# Patient Record
Sex: Female | Born: 1935 | Race: White | Hispanic: No | Smoking: Former smoker
Health system: Southern US, Community
[De-identification: ages and names within clinical notes are randomized; demographics above are authoritative.]

## PROBLEM LIST (undated history)

## (undated) DIAGNOSIS — M109 Gout, unspecified: Secondary | ICD-10-CM

## (undated) DIAGNOSIS — F0280 Dementia in other diseases classified elsewhere without behavioral disturbance: Secondary | ICD-10-CM

## (undated) DIAGNOSIS — N6019 Diffuse cystic mastopathy of unspecified breast: Secondary | ICD-10-CM

## (undated) DIAGNOSIS — I10 Essential (primary) hypertension: Secondary | ICD-10-CM

## (undated) DIAGNOSIS — N3090 Cystitis, unspecified without hematuria: Secondary | ICD-10-CM

## (undated) DIAGNOSIS — Q782 Osteopetrosis: Secondary | ICD-10-CM

## (undated) DIAGNOSIS — G309 Alzheimer's disease, unspecified: Secondary | ICD-10-CM

## (undated) HISTORY — DX: Cystitis, unspecified without hematuria: N30.90

## (undated) HISTORY — DX: Dementia in other diseases classified elsewhere, unspecified severity, without behavioral disturbance, psychotic disturbance, mood disturbance, and anxiety: F02.80

## (undated) HISTORY — DX: Osteopetrosis: Q78.2

## (undated) HISTORY — PX: BREAST EXCISIONAL BIOPSY: SUR124

## (undated) HISTORY — DX: Gout, unspecified: M10.9

## (undated) HISTORY — PX: TONSILLECTOMY: SUR1361

## (undated) HISTORY — PX: NASAL SINUS SURGERY: SHX719

## (undated) HISTORY — DX: Diffuse cystic mastopathy of unspecified breast: N60.19

## (undated) HISTORY — DX: Essential (primary) hypertension: I10

## (undated) HISTORY — DX: Dementia in other diseases classified elsewhere, unspecified severity, without behavioral disturbance, psychotic disturbance, mood disturbance, and anxiety: G30.9

## (undated) HISTORY — PX: OOPHORECTOMY: SHX86

---

## 1972-05-14 HISTORY — PX: BREAST LUMPECTOMY: SHX2

## 1978-05-14 HISTORY — PX: ABDOMINAL HYSTERECTOMY: SHX81

## 1978-05-14 HISTORY — PX: TOTAL ABDOMINAL HYSTERECTOMY W/ BILATERAL SALPINGOOPHORECTOMY: SHX83

## 1992-05-14 HISTORY — PX: COLONOSCOPY: SHX174

## 2004-03-21 ENCOUNTER — Ambulatory Visit: Payer: Self-pay

## 2005-04-19 ENCOUNTER — Ambulatory Visit: Payer: Self-pay

## 2005-05-08 ENCOUNTER — Ambulatory Visit: Payer: Self-pay

## 2005-09-26 ENCOUNTER — Emergency Department: Payer: Self-pay | Admitting: Emergency Medicine

## 2005-12-21 ENCOUNTER — Ambulatory Visit: Payer: Self-pay | Admitting: Family Medicine

## 2006-03-20 ENCOUNTER — Ambulatory Visit: Payer: Self-pay | Admitting: General Surgery

## 2006-05-14 HISTORY — PX: BLADDER STONE REMOVAL: SHX568

## 2006-09-16 ENCOUNTER — Ambulatory Visit: Payer: Self-pay | Admitting: General Surgery

## 2007-01-17 ENCOUNTER — Ambulatory Visit: Payer: Self-pay | Admitting: Urology

## 2007-01-30 ENCOUNTER — Other Ambulatory Visit: Payer: Self-pay

## 2007-01-30 ENCOUNTER — Ambulatory Visit: Payer: Self-pay

## 2007-10-01 ENCOUNTER — Ambulatory Visit: Payer: Self-pay | Admitting: General Surgery

## 2008-10-04 ENCOUNTER — Ambulatory Visit: Payer: Self-pay | Admitting: Family Medicine

## 2009-05-25 ENCOUNTER — Ambulatory Visit: Payer: Self-pay | Admitting: Internal Medicine

## 2009-08-11 ENCOUNTER — Ambulatory Visit: Payer: Self-pay | Admitting: Unknown Physician Specialty

## 2009-10-14 ENCOUNTER — Ambulatory Visit: Payer: Self-pay | Admitting: General Surgery

## 2010-10-16 ENCOUNTER — Ambulatory Visit: Payer: Self-pay | Admitting: General Surgery

## 2011-02-15 ENCOUNTER — Ambulatory Visit: Payer: Self-pay | Admitting: Family Medicine

## 2011-05-15 HISTORY — PX: LAPAROSCOPY: SHX197

## 2011-08-31 ENCOUNTER — Inpatient Hospital Stay: Payer: Self-pay | Admitting: Internal Medicine

## 2011-08-31 LAB — URINALYSIS, COMPLETE
Glucose,UR: NEGATIVE mg/dL (ref 0–75)
Nitrite: POSITIVE
Ph: 5 (ref 4.5–8.0)
Protein: 100
RBC,UR: 18 /HPF (ref 0–5)
Squamous Epithelial: 4

## 2011-08-31 LAB — COMPREHENSIVE METABOLIC PANEL
Alkaline Phosphatase: 175 U/L — ABNORMAL HIGH (ref 50–136)
BUN: 42 mg/dL — ABNORMAL HIGH (ref 7–18)
Bilirubin,Total: 1 mg/dL (ref 0.2–1.0)
Co2: 19 mmol/L — ABNORMAL LOW (ref 21–32)
Creatinine: 2.33 mg/dL — ABNORMAL HIGH (ref 0.60–1.30)
EGFR (African American): 23 — ABNORMAL LOW
EGFR (Non-African Amer.): 20 — ABNORMAL LOW
Glucose: 97 mg/dL (ref 65–99)
Osmolality: 286 (ref 275–301)
SGPT (ALT): 105 U/L — ABNORMAL HIGH
Sodium: 138 mmol/L (ref 136–145)
Total Protein: 6.8 g/dL (ref 6.4–8.2)

## 2011-08-31 LAB — DIFFERENTIAL
Basophil %: 0.1 %
Eosinophil %: 0.1 %
Lymphocyte #: 0.7 10*3/uL — ABNORMAL LOW (ref 1.0–3.6)
Lymphocyte %: 4.9 %
Neutrophil %: 86.6 %

## 2011-08-31 LAB — CBC
HCT: 38.6 % (ref 35.0–47.0)
MCH: 28 pg (ref 26.0–34.0)
MCV: 86 fL (ref 80–100)
RBC: 4.51 10*6/uL (ref 3.80–5.20)

## 2011-08-31 LAB — CK TOTAL AND CKMB (NOT AT ARMC)
CK, Total: 673 U/L — ABNORMAL HIGH (ref 21–215)
CK-MB: 2.6 ng/mL (ref 0.5–3.6)
CK-MB: 2.1 ng/mL (ref 0.5–3.6)

## 2011-08-31 LAB — RAPID INFLUENZA A&B ANTIGENS

## 2011-09-01 ENCOUNTER — Ambulatory Visit: Payer: Self-pay | Admitting: Urology

## 2011-09-01 LAB — BASIC METABOLIC PANEL
Anion Gap: 11 (ref 7–16)
Calcium, Total: 9 mg/dL (ref 8.5–10.1)
Co2: 20 mmol/L — ABNORMAL LOW (ref 21–32)
EGFR (Non-African Amer.): 20 — ABNORMAL LOW
Glucose: 145 mg/dL — ABNORMAL HIGH (ref 65–99)
Potassium: 3.5 mmol/L (ref 3.5–5.1)
Sodium: 140 mmol/L (ref 136–145)

## 2011-09-01 LAB — CBC WITH DIFFERENTIAL/PLATELET
Basophil #: 0.1 10*3/uL (ref 0.0–0.1)
Basophil #: 0 10*3/uL (ref 0.0–0.1)
Basophil %: 0.3 %
Eosinophil #: 0 10*3/uL (ref 0.0–0.7)
Eosinophil %: 0.1 %
Eosinophil %: 0.1 %
HCT: 33.4 % — ABNORMAL LOW (ref 35.0–47.0)
HGB: 10.8 g/dL — ABNORMAL LOW (ref 12.0–16.0)
Lymphocyte #: 0.4 10*3/uL — ABNORMAL LOW (ref 1.0–3.6)
MCH: 28.5 pg (ref 26.0–34.0)
MCH: 27.7 pg (ref 26.0–34.0)
MCV: 86 fL (ref 80–100)
Monocyte #: 1.4 x10 3/mm — ABNORMAL HIGH (ref 0.2–0.9)
Monocyte %: 6 %
Neutrophil %: 92 %
Platelet: 137 10*3/uL — ABNORMAL LOW (ref 150–440)
RBC: 4.13 10*6/uL (ref 3.80–5.20)
RDW: 15 % — ABNORMAL HIGH (ref 11.5–14.5)
RDW: 14.7 % — ABNORMAL HIGH (ref 11.5–14.5)
WBC: 16.5 10*3/uL — ABNORMAL HIGH (ref 3.6–11.0)
WBC: 19 10*3/uL — ABNORMAL HIGH (ref 3.6–11.0)

## 2011-09-01 LAB — TROPONIN I: Troponin-I: 0.13 ng/mL — ABNORMAL HIGH

## 2011-09-01 LAB — LIPID PANEL
Cholesterol: 94 mg/dL (ref 0–200)
HDL Cholesterol: 4 mg/dL — ABNORMAL LOW (ref 40–60)
Ldl Cholesterol, Calc: 57 mg/dL (ref 0–100)
Triglycerides: 163 mg/dL (ref 0–200)

## 2011-09-01 LAB — CK TOTAL AND CKMB (NOT AT ARMC)
CK, Total: 533 U/L — ABNORMAL HIGH (ref 21–215)
CK-MB: 2.9 ng/mL (ref 0.5–3.6)

## 2011-09-01 LAB — HEPATIC FUNCTION PANEL A (ARMC)
Albumin: 2 g/dL — ABNORMAL LOW (ref 3.4–5.0)
Bilirubin,Total: 1.1 mg/dL — ABNORMAL HIGH (ref 0.2–1.0)
SGOT(AST): 91 U/L — ABNORMAL HIGH (ref 15–37)
SGPT (ALT): 80 U/L — ABNORMAL HIGH
Total Protein: 5.4 g/dL — ABNORMAL LOW (ref 6.4–8.2)

## 2011-09-01 LAB — MAGNESIUM: Magnesium: 2 mg/dL

## 2011-09-01 LAB — PROTIME-INR: Prothrombin Time: 15.1 secs — ABNORMAL HIGH (ref 11.5–14.7)

## 2011-09-01 LAB — APTT: Activated PTT: 34 secs (ref 23.6–35.9)

## 2011-09-01 LAB — HEMOGLOBIN A1C: Hemoglobin A1C: 5.7 % (ref 4.2–6.3)

## 2011-09-02 LAB — COMPREHENSIVE METABOLIC PANEL
Albumin: 1.7 g/dL — ABNORMAL LOW (ref 3.4–5.0)
Alkaline Phosphatase: 154 U/L — ABNORMAL HIGH (ref 50–136)
Anion Gap: 9 (ref 7–16)
BUN: 36 mg/dL — ABNORMAL HIGH (ref 7–18)
Calcium, Total: 9.3 mg/dL (ref 8.5–10.1)
Co2: 21 mmol/L (ref 21–32)
Creatinine: 1.72 mg/dL — ABNORMAL HIGH (ref 0.60–1.30)
EGFR (Non-African Amer.): 29 — ABNORMAL LOW
Glucose: 185 mg/dL — ABNORMAL HIGH (ref 65–99)
Osmolality: 291 (ref 275–301)
SGOT(AST): 58 U/L — ABNORMAL HIGH (ref 15–37)
Sodium: 139 mmol/L (ref 136–145)
Total Protein: 5.1 g/dL — ABNORMAL LOW (ref 6.4–8.2)

## 2011-09-02 LAB — CBC WITH DIFFERENTIAL/PLATELET
Basophil %: 0.1 %
Eosinophil #: 0 10*3/uL (ref 0.0–0.7)
Eosinophil %: 0 %
HCT: 32.8 % — ABNORMAL LOW (ref 35.0–47.0)
MCH: 27.9 pg (ref 26.0–34.0)
MCHC: 32.3 g/dL (ref 32.0–36.0)
RBC: 3.79 10*6/uL — ABNORMAL LOW (ref 3.80–5.20)
RDW: 14.9 % — ABNORMAL HIGH (ref 11.5–14.5)

## 2011-09-02 LAB — PROTIME-INR
INR: 1.1
Prothrombin Time: 14.8 secs — ABNORMAL HIGH (ref 11.5–14.7)

## 2011-09-02 LAB — APTT: Activated PTT: 34.9 secs (ref 23.6–35.9)

## 2011-09-03 LAB — COMPREHENSIVE METABOLIC PANEL
Alkaline Phosphatase: 141 U/L — ABNORMAL HIGH (ref 50–136)
Anion Gap: 9 (ref 7–16)
BUN: 28 mg/dL — ABNORMAL HIGH (ref 7–18)
Bilirubin,Total: 1.2 mg/dL — ABNORMAL HIGH (ref 0.2–1.0)
Chloride: 112 mmol/L — ABNORMAL HIGH (ref 98–107)
Co2: 22 mmol/L (ref 21–32)
Creatinine: 1.3 mg/dL (ref 0.60–1.30)
EGFR (Non-African Amer.): 40 — ABNORMAL LOW
Osmolality: 291 (ref 275–301)
Sodium: 143 mmol/L (ref 136–145)
Total Protein: 4.9 g/dL — ABNORMAL LOW (ref 6.4–8.2)

## 2011-09-03 LAB — CBC WITH DIFFERENTIAL/PLATELET
Basophil %: 0.2 %
Lymphocyte #: 1.1 10*3/uL (ref 1.0–3.6)
MCHC: 33.2 g/dL (ref 32.0–36.0)
Monocyte #: 1.1 x10 3/mm — ABNORMAL HIGH (ref 0.2–0.9)
Monocyte %: 6.4 %
Neutrophil #: 14.7 10*3/uL — ABNORMAL HIGH (ref 1.4–6.5)
Platelet: 89 10*3/uL — ABNORMAL LOW (ref 150–440)

## 2011-09-03 LAB — MAGNESIUM: Magnesium: 1.4 mg/dL — ABNORMAL LOW

## 2011-09-04 LAB — CBC WITH DIFFERENTIAL/PLATELET
HGB: 10 g/dL — ABNORMAL LOW (ref 12.0–16.0)
Lymphocyte %: 7.8 %
Neutrophil #: 11.2 10*3/uL — ABNORMAL HIGH (ref 1.4–6.5)
Platelet: 94 10*3/uL — ABNORMAL LOW (ref 150–440)
RBC: 3.49 10*6/uL — ABNORMAL LOW (ref 3.80–5.20)
WBC: 13.5 10*3/uL — ABNORMAL HIGH (ref 3.6–11.0)

## 2011-09-04 LAB — COMPREHENSIVE METABOLIC PANEL
Albumin: 1.7 g/dL — ABNORMAL LOW (ref 3.4–5.0)
Alkaline Phosphatase: 120 U/L (ref 50–136)
Anion Gap: 9 (ref 7–16)
Bilirubin,Total: 1.1 mg/dL — ABNORMAL HIGH (ref 0.2–1.0)
Calcium, Total: 9 mg/dL (ref 8.5–10.1)
Chloride: 114 mmol/L — ABNORMAL HIGH (ref 98–107)
Co2: 22 mmol/L (ref 21–32)
Creatinine: 1.01 mg/dL (ref 0.60–1.30)
EGFR (Non-African Amer.): 54 — ABNORMAL LOW
Glucose: 94 mg/dL (ref 65–99)
Potassium: 3.9 mmol/L (ref 3.5–5.1)
SGOT(AST): 34 U/L (ref 15–37)
SGPT (ALT): 39 U/L

## 2011-09-05 LAB — BODY FLUID CULTURE

## 2011-09-06 LAB — CULTURE, BLOOD (SINGLE)

## 2011-09-17 ENCOUNTER — Ambulatory Visit: Payer: Self-pay | Admitting: Urology

## 2011-09-19 ENCOUNTER — Ambulatory Visit: Payer: Self-pay | Admitting: Urology

## 2011-10-01 ENCOUNTER — Ambulatory Visit: Payer: Self-pay | Admitting: Urology

## 2011-10-17 ENCOUNTER — Ambulatory Visit: Payer: Self-pay | Admitting: General Surgery

## 2012-09-18 ENCOUNTER — Ambulatory Visit: Payer: Self-pay | Admitting: Unknown Physician Specialty

## 2012-10-17 ENCOUNTER — Ambulatory Visit: Payer: Self-pay | Admitting: General Surgery

## 2012-10-21 ENCOUNTER — Encounter: Payer: Self-pay | Admitting: General Surgery

## 2012-12-18 ENCOUNTER — Encounter: Payer: Self-pay | Admitting: General Surgery

## 2012-12-18 ENCOUNTER — Ambulatory Visit (INDEPENDENT_AMBULATORY_CARE_PROVIDER_SITE_OTHER): Payer: Medicare Other | Admitting: General Surgery

## 2012-12-18 VITALS — BP 130/76 | HR 76 | Resp 12 | Ht 66.0 in | Wt 194.0 lb

## 2012-12-18 DIAGNOSIS — N6019 Diffuse cystic mastopathy of unspecified breast: Secondary | ICD-10-CM

## 2012-12-18 NOTE — Patient Instructions (Addendum)
Patient to return in one year. 

## 2012-12-18 NOTE — Progress Notes (Signed)
Patient ID: Unknown Kaitlin [name], female   DOB: 10/23/1934, 78 y.o.   MRN: [id number]  Chief Complaint  Patient presents with  . Other    mammogram    HPI Kaitlin [name] is a 78 y.o. female. who presents for a breast evaluation. The most recent mammogram was done on 10/17/12 cat 2. Patient does perform regular self breast checks and gets regular mammograms done.    HPI  Past Medical History  Diagnosis Date  . Hypertension   . Diffuse cystic mastopathy   . Cystitis   . Gout     Past Surgical History  Procedure Laterality Date  . Nasal sinus surgery    . Colonoscopy  1994  . Abdominal hysterectomy  1980  . Breast lumpectomy  1974  . Bladder stone removal  2008  . Oophorectomy Bilateral   . Laparoscopy  2013    gallstone removal    No family history on file.  Social History History  Substance Use Topics  . Smoking status: Former Smoker -- 0.50 packs/day    Types: Cigarettes  . Smokeless tobacco: Never Used  . Alcohol Use: No    Allergies  Allergen Reactions  . Augmentin (Amoxicillin-Pot Clavulanate) Nausea And Vomiting and Rash  . Tetracyclines & Related Rash    Current Outpatient Prescriptions  Medication Sig Dispense Refill  . alendronate (FOSAMAX) 40 MG tablet Take 40 mg by mouth every 7 (seven) days. Take with a full glass of water on an empty stomach.      Marland Kitchen aspirin 81 MG tablet Take 81 mg by mouth daily.      . cholecalciferol (D-VI-SOL) 400 UNIT/ML LIQD Take 400 Units by mouth daily.      Marland Kitchen conjugated estrogens (PREMARIN) vaginal cream Place 0.5 g vaginally daily.      Marland Kitchen donepezil (ARICEPT) 10 MG tablet Take 10 mg by mouth at bedtime as needed.      . fluticasone (FLOVENT DISKUS) 50 MCG/BLIST diskus inhaler Inhale 1 puff into the lungs 2 (two) times daily.      Marland Kitchen loratadine (CLARITIN) 10 MG tablet Take 10 mg by mouth daily.      . potassium chloride (K-DUR) 10 MEQ tablet Take 10 mEq by mouth 2 (two) times daily.      . vitamin E (VITAMIN E) 400 UNIT capsule Take  400 Units by mouth daily.       No current facility-administered medications for this visit.    Review of Systems Review of Systems  Constitutional: Negative.   Respiratory: Negative.   Cardiovascular: Negative.     Blood pressure 130/76, pulse 76, resp. rate 12, height 5\' 6"  (1.676 m), weight 194 lb (87.998 kg).  Physical Exam Physical Exam  Constitutional: She is oriented to person, place, and time. She appears well-developed and well-nourished.  Eyes: Conjunctivae are normal. No scleral icterus.  Neck: Neck supple. No mass and no thyromegaly present.  Cardiovascular: Normal rate, regular rhythm and normal heart sounds.   Pulmonary/Chest: Breath sounds normal. Right breast exhibits no inverted nipple, no mass, no nipple discharge, no skin change and no tenderness. Left breast exhibits no inverted nipple, no mass, no nipple discharge, no skin change and no tenderness.  Abdominal: Soft. Bowel sounds are normal. There is no hepatomegaly. There is no tenderness. No hernia.  Lymphadenopathy:    She has no cervical adenopathy.    She has no axillary adenopathy.  Neurological: She is alert and oriented to person, place, and time.  Skin: Skin is warm  and dry.    Data Reviewed mammogram reviewed  Assessment    Stable exam     Plan   Patient to return in one year mammogram.       Ples Specter 12/18/2012, 9:51 AM

## 2013-05-24 IMAGING — US ABDOMEN ULTRASOUND
1 series · 17 of 25 positions shown · non-contrast
Comparison: none

REASON FOR EXAM: elevated lfts, renal failre. eval for biliary dz or
hydronehrosis
COMMENTS:

PROCEDURE:     US  - US ABDOMEN GENERAL SURVEY  - August 31, 2011  [DATE]
RESULT:      The liver demonstrates a homogeneous echotexture. Hepatopetal
flow is identified within the portal vein. There is no evidence of ascites.
The aorta and IVC are unremarkable. The pancreas is unremarkable.

[Series 1: abdomen ultrasound · 17 of 97 slices shown]
[im 1/97]
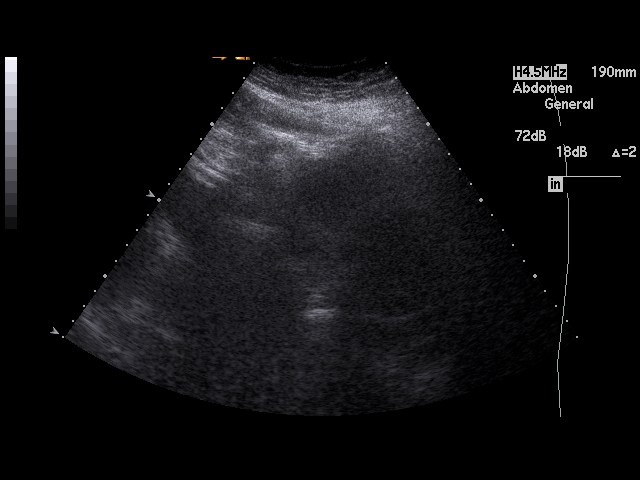
[im 9/97]
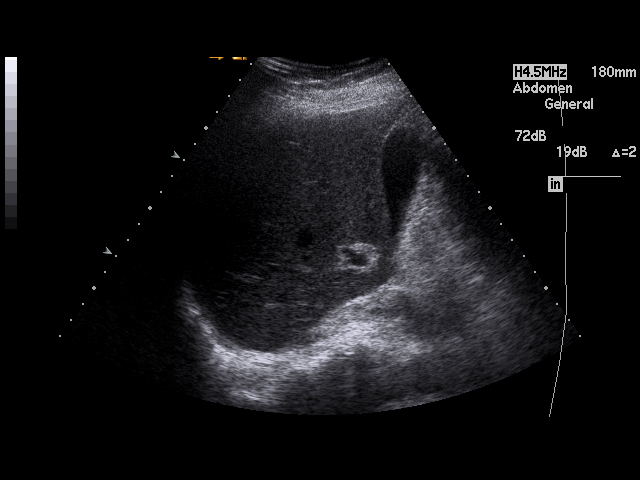
[im 13/97]
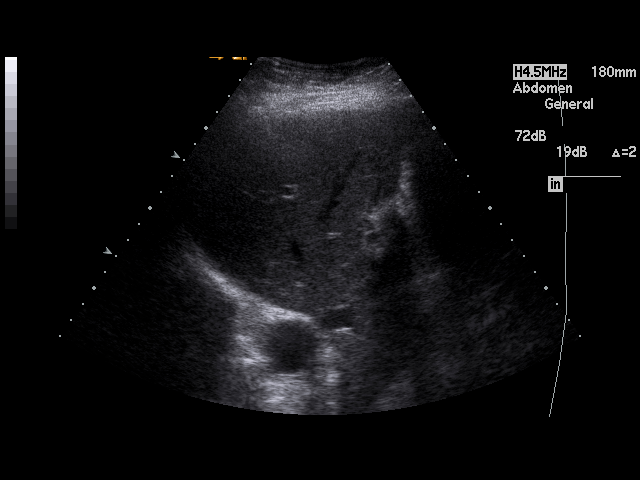
[im 21/97]
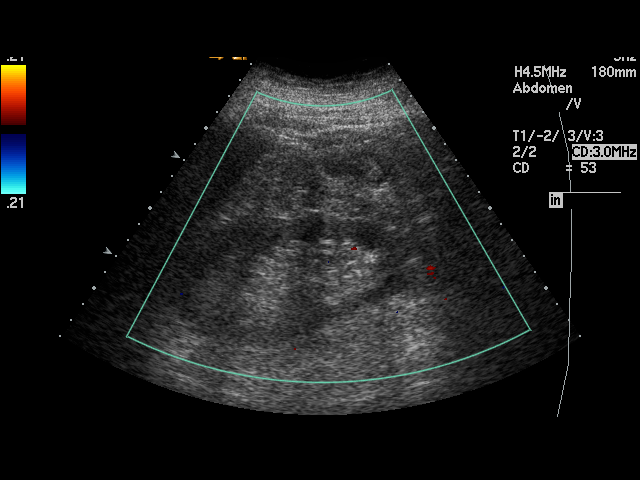
[im 25/97]
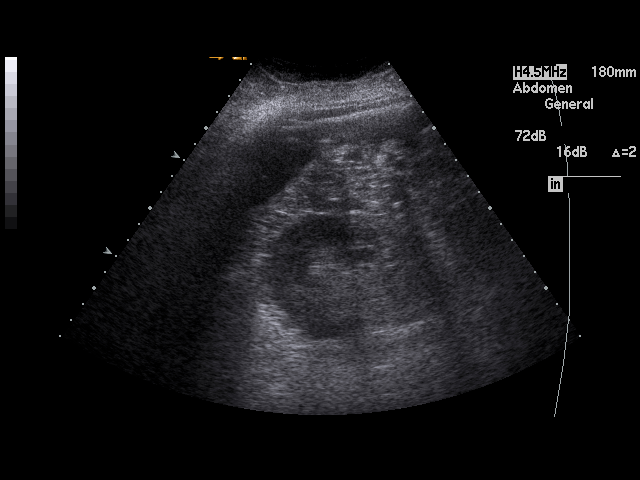
[im 33/97]
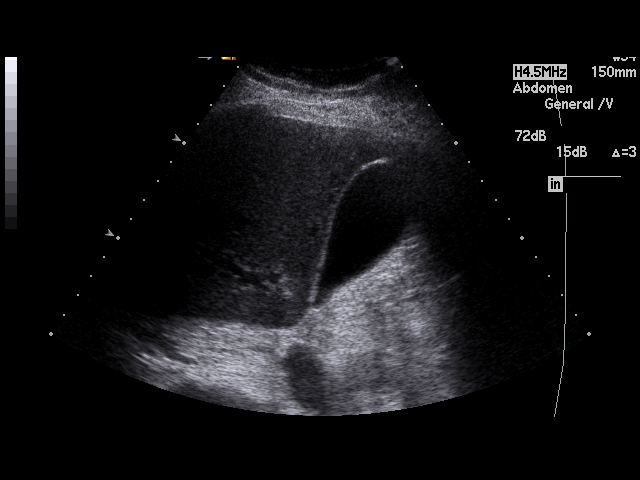
[im 37/97]
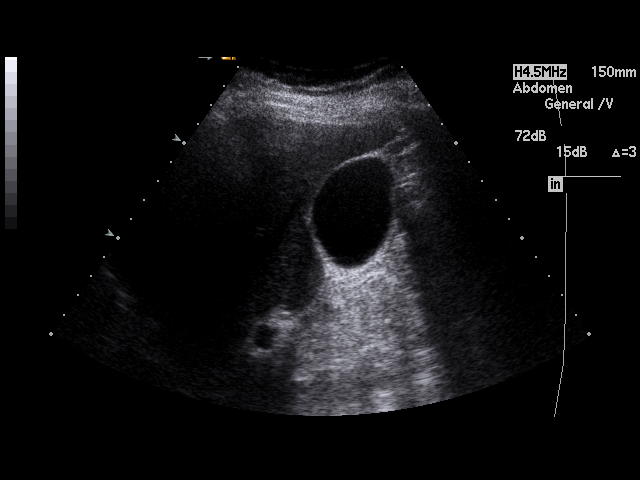
[im 45/97]
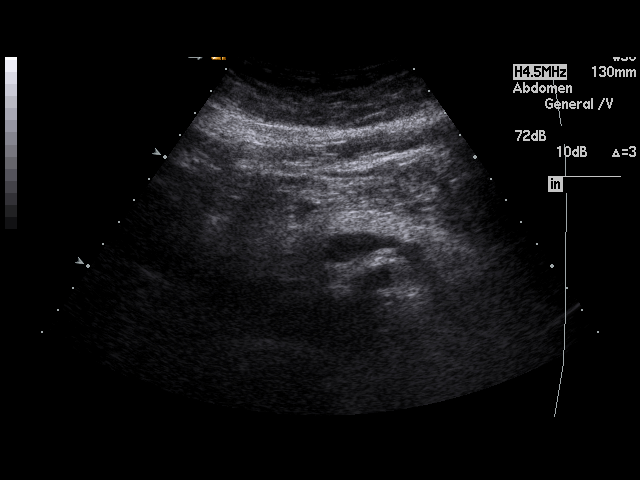
[im 49/97]
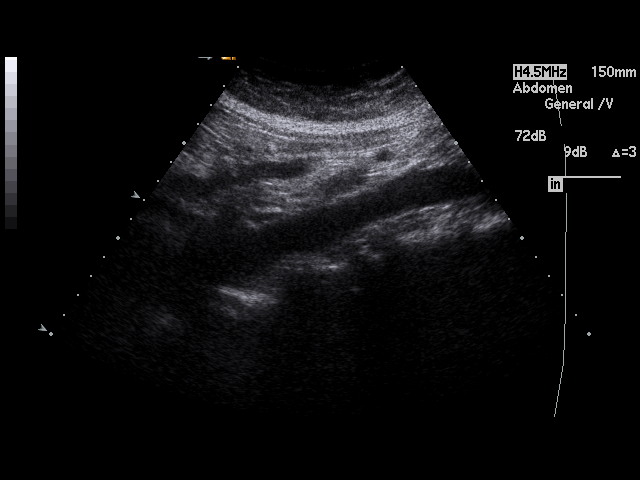
[im 53/97]
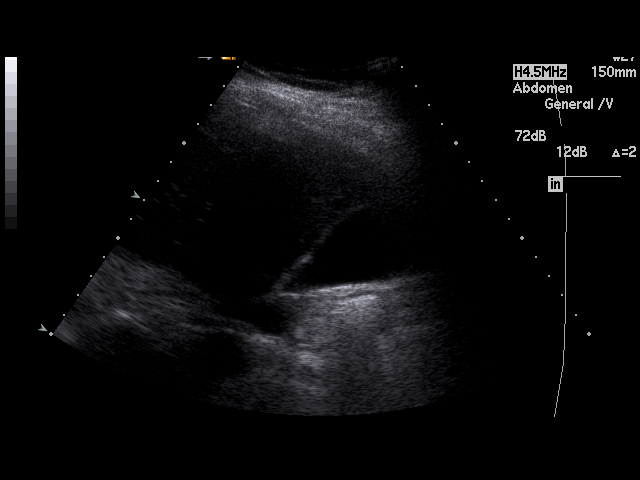
[im 61/97]
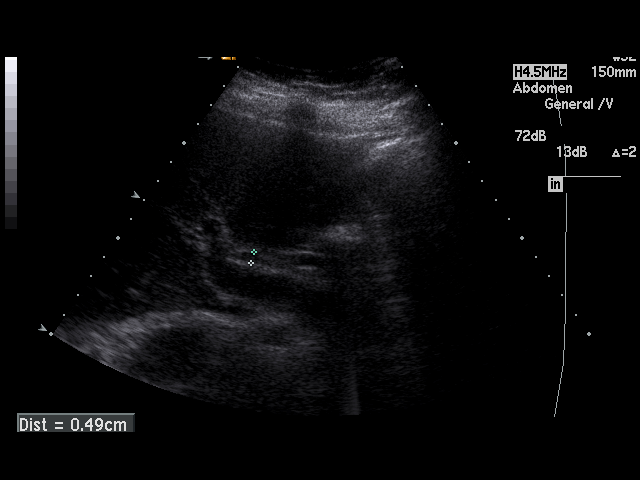
[im 65/97]
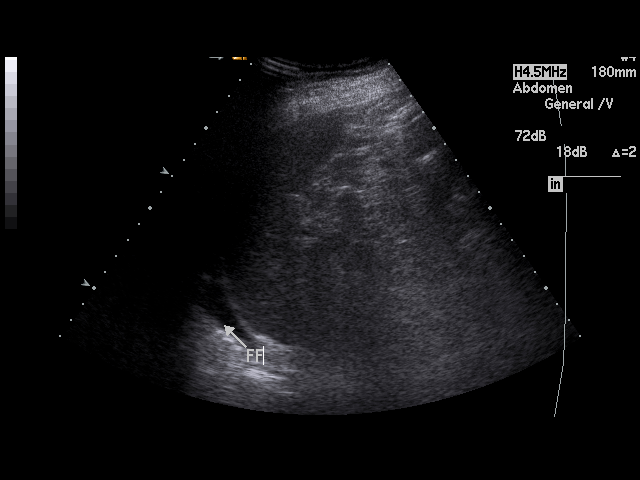
[im 73/97]
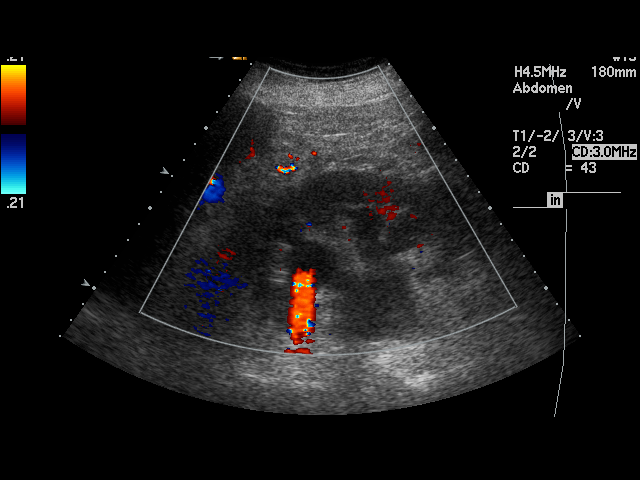
[im 77/97]
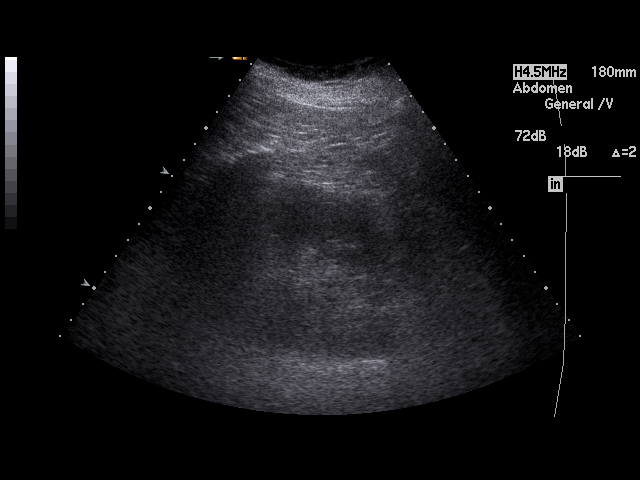
[im 85/97]
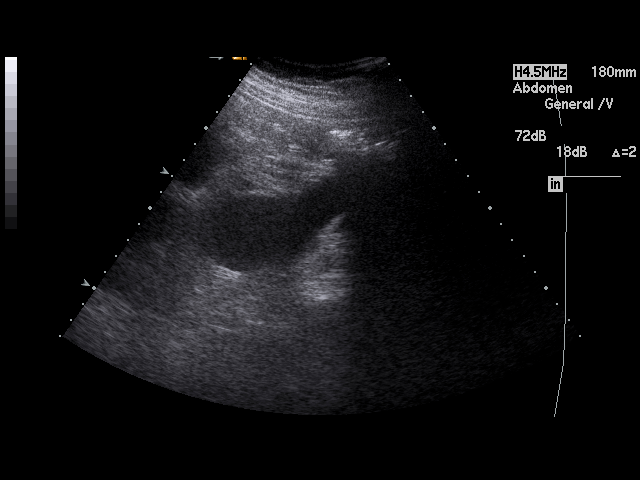
[im 89/97]
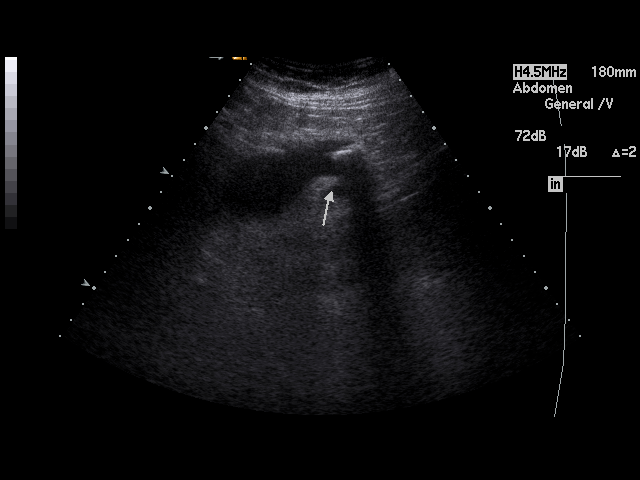
[im 97/97]
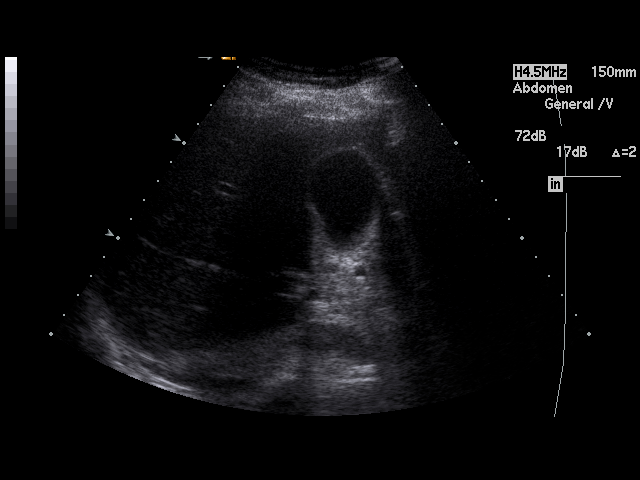

[17 of 25 positions shown; findings below may reference images not displayed]

IMPRESSION: 1. Moderate hydronephrosis as well as moderate hydroureter. The ureter
measures 2.07 cm in diameter. Within the proximal left ureter, a 1.5 mm
calculus is identified with acoustic shadowing.
2. Not mentioned above, there are findings which may represent a left
pleural effusion.
3. No sonographic evidence of cholecystitis.

## 2013-05-25 IMAGING — CT CT ABD-PELV W/O CM
1 of 4 series · 13 of 32 positions shown, 19 images · non-contrast
Comparison: none

REASON FOR EXAM: (1) hydronephrosis; (2) hydronephrosis-nephrostomy tube
placement
COMMENTS:

PROCEDURE:     CT  - CT ABDOMEN AND PELVIS W[DATE]  [DATE]
RESULT:
TECHNIQUE: Helical noncontrasted prone 3 mm sections were obtained from the
lung bases through the pubic symphysis.

[Series 2: soft tissue · axial · 0.87mm/px · z∈[-1241,-800]mm · 13 of 173 slices shown, 19 images]
[im 13/173  soft-tissue]
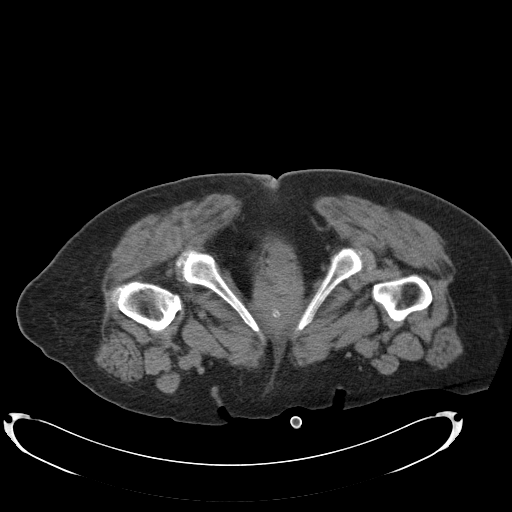
[im 13/173  bone]
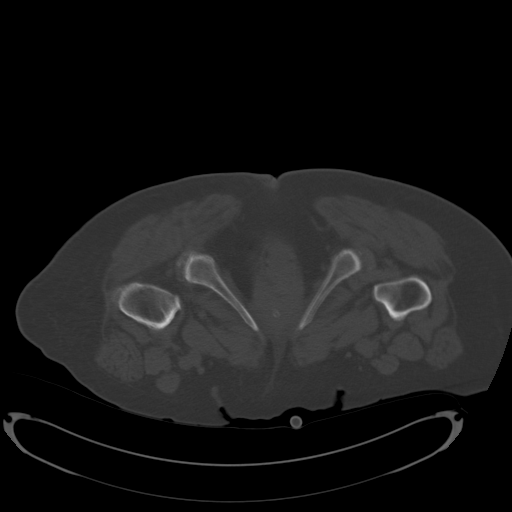
[im 25/173  soft-tissue]
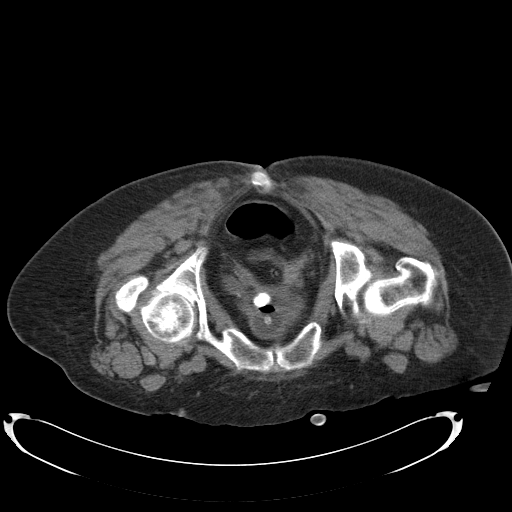
[im 37/173  soft-tissue]
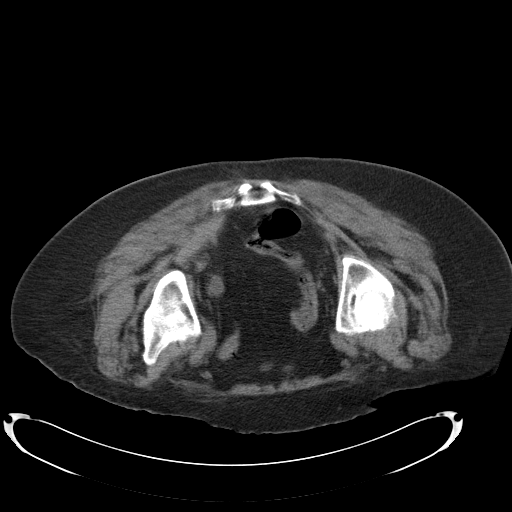
[im 50/173  soft-tissue]
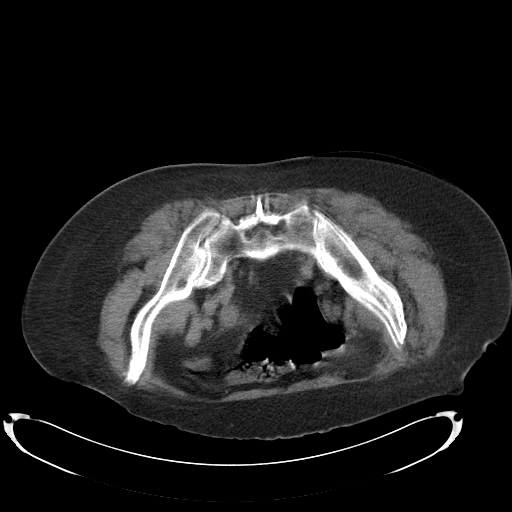
[im 62/173  soft-tissue]
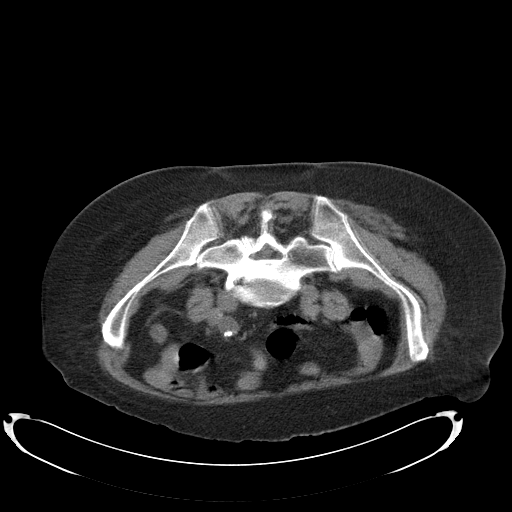
[im 74/173  soft-tissue]
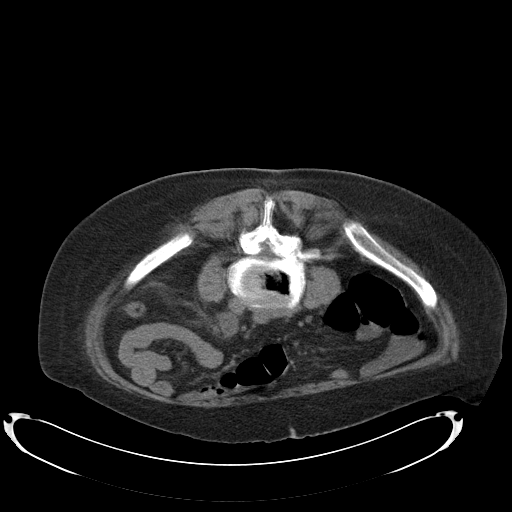
[im 87/173  soft-tissue]
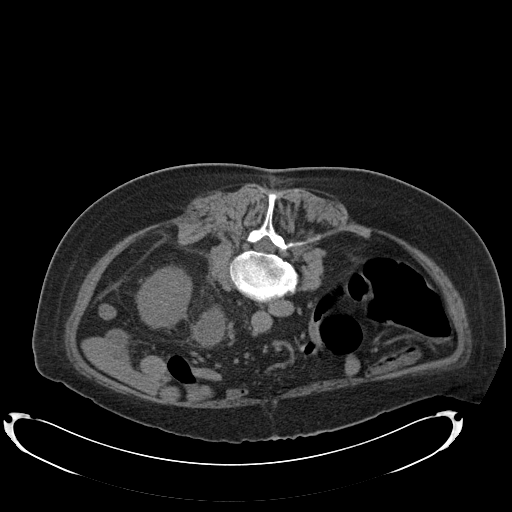
[im 99/173  soft-tissue]
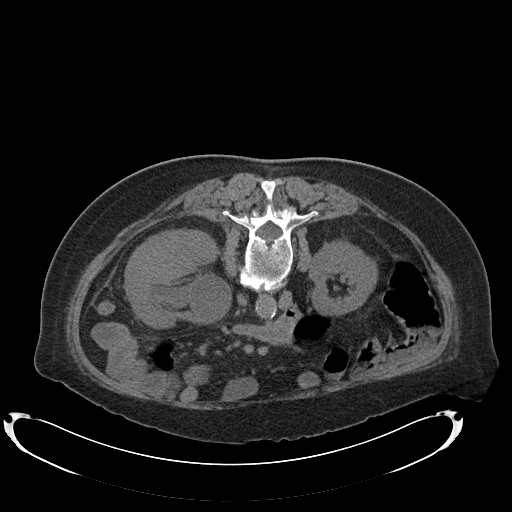
[im 111/173  soft-tissue]
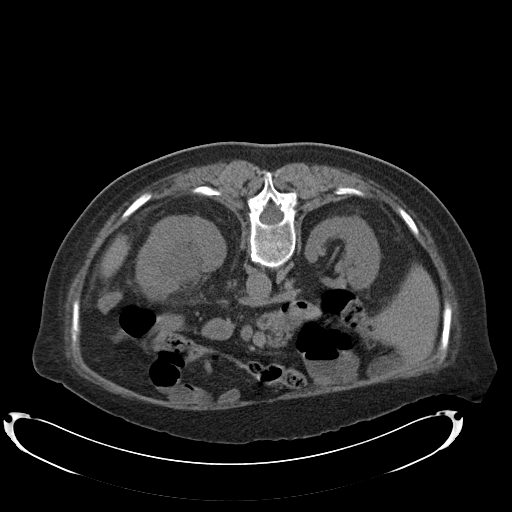
[im 111/173  bone]
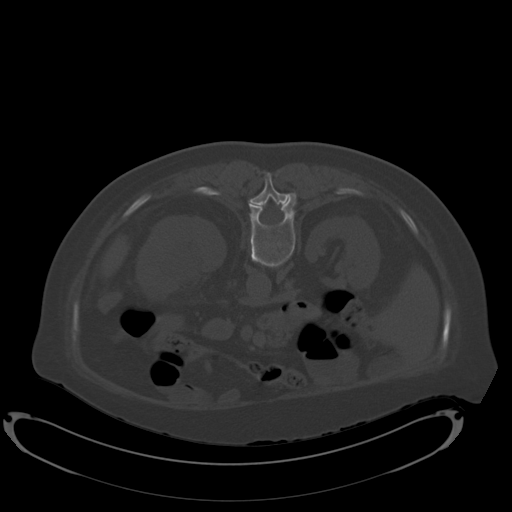
[im 123/173  soft-tissue]
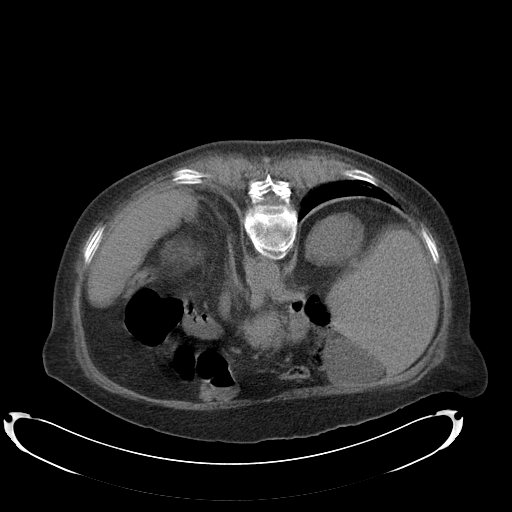
[im 123/173  lung]
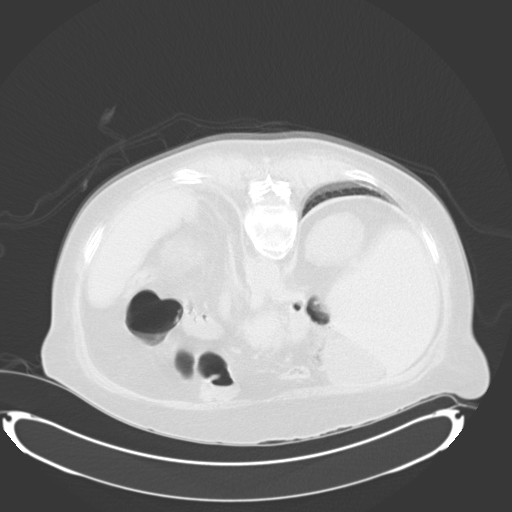
[im 136/173  soft-tissue]
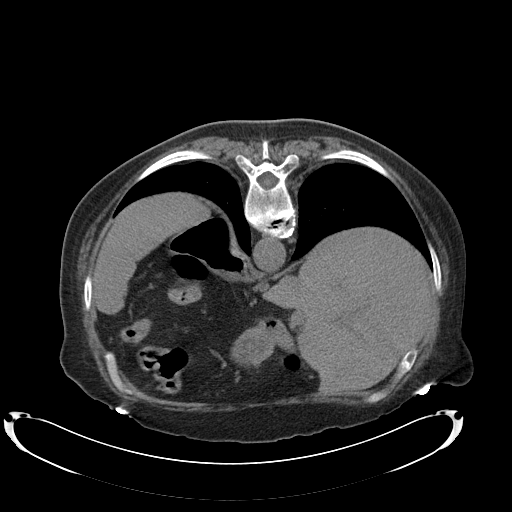
[im 136/173  lung]
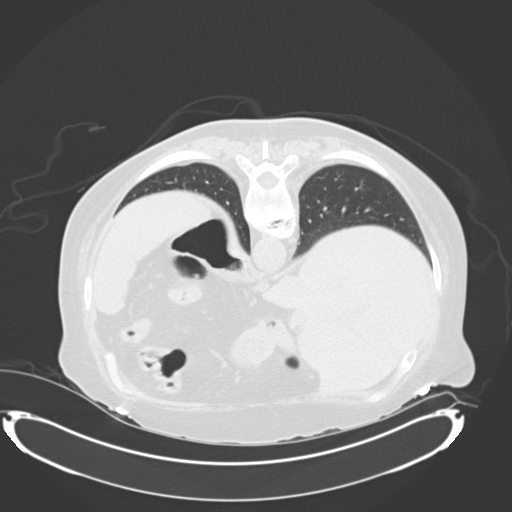
[im 148/173  soft-tissue]
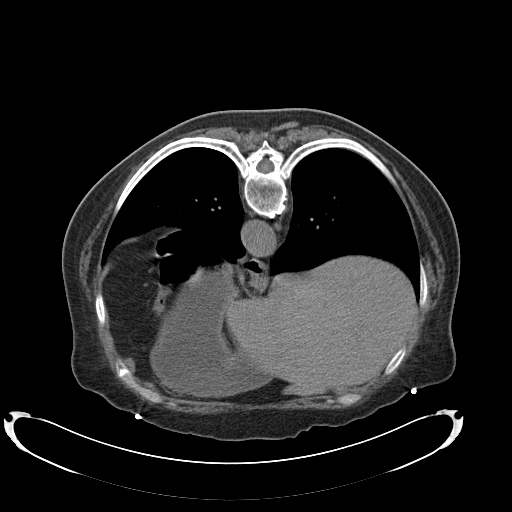
[im 148/173  lung]
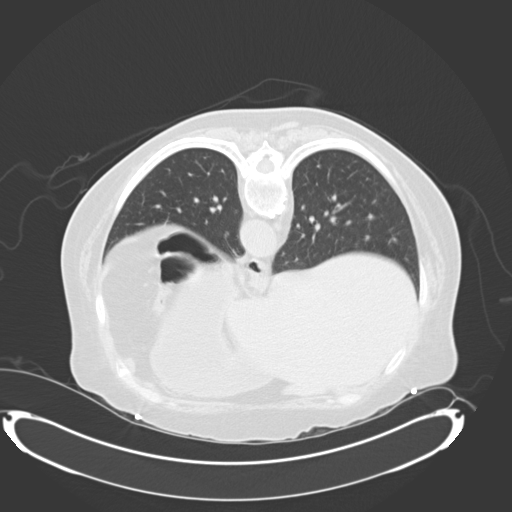
[im 160/173  soft-tissue]
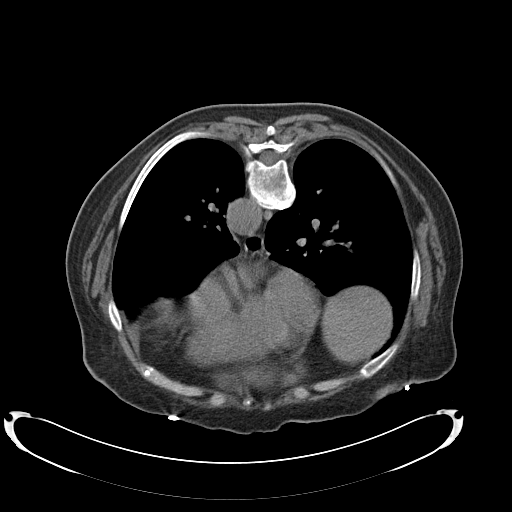
[im 160/173  lung]
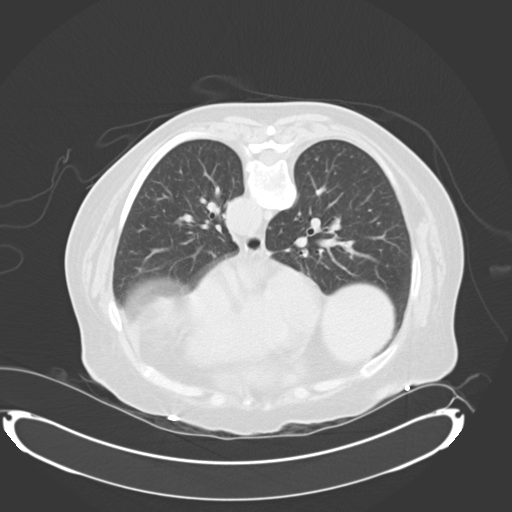

[13 of 32 positions shown; findings below may reference images not displayed]

FINDINGS: Severe hydronephrosis is once again identified within the left
kidney. There is no evidence of retroperitoneal structures in the region of
the left kidney which would preempt nephrostomy tube placement. The study is
otherwise unchanged from prior CT.
IMPRESSION: Pre nephrostomy tube evaluation demonstrating no anatomic variability which
would preempt nephrostomy tube placement.

## 2013-05-25 IMAGING — CT CT ABD-PELV W/O CM
1 of 2 series · 16 of 32 positions shown, 20 images · non-contrast
Comparison: none

REASON FOR EXAM: (1) tube placement; (2) tube placement
COMMENTS:

[Series 2: soft tissue · axial · 0.88mm/px · z∈[-534,-72]mm · 16 of 168 slices shown, 20 images]
[im 7/168  soft-tissue]
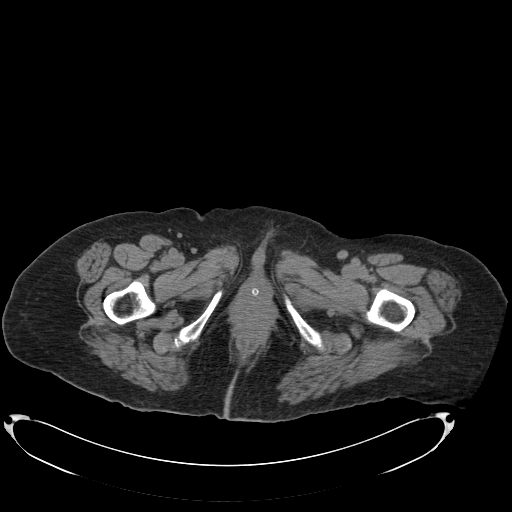
[im 7/168  bone]
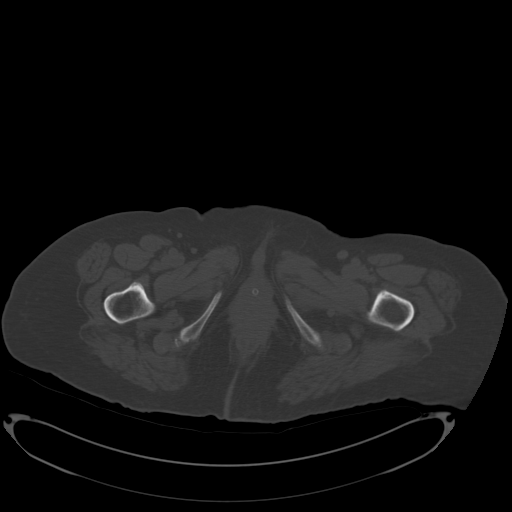
[im 21/168  soft-tissue]
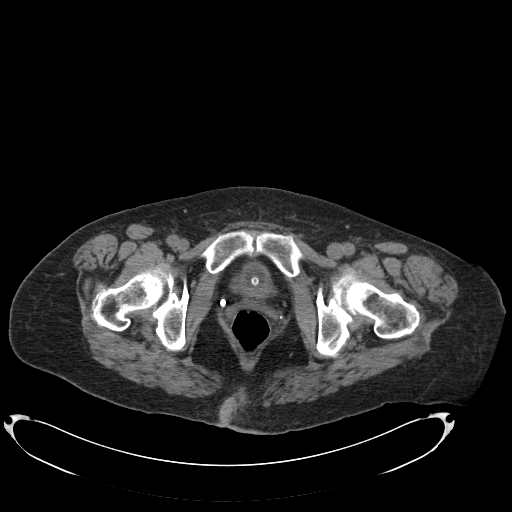
[im 35/168  soft-tissue]
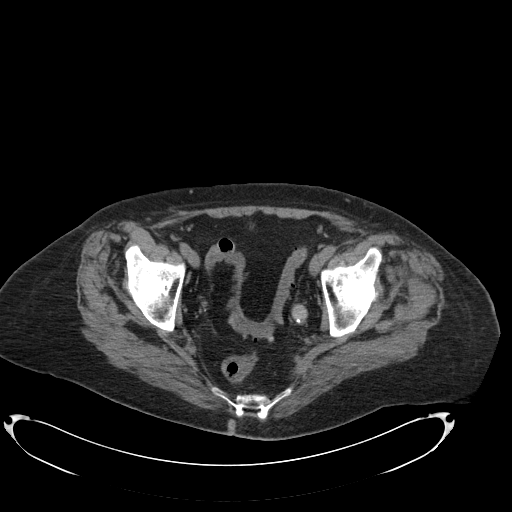
[im 42/168  soft-tissue]
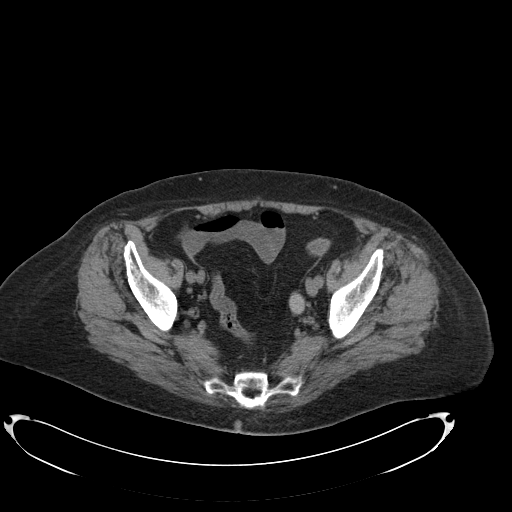
[im 56/168  soft-tissue]
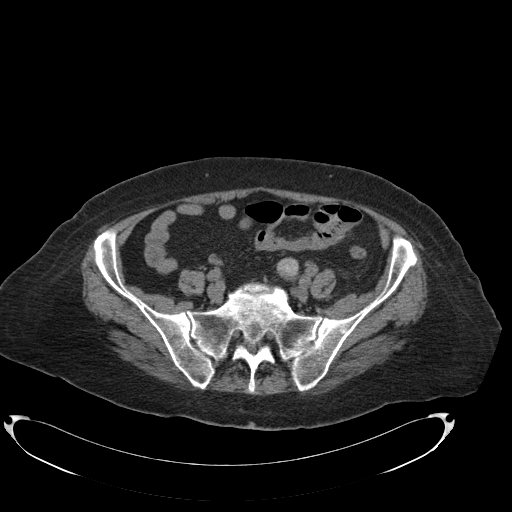
[im 70/168  soft-tissue]
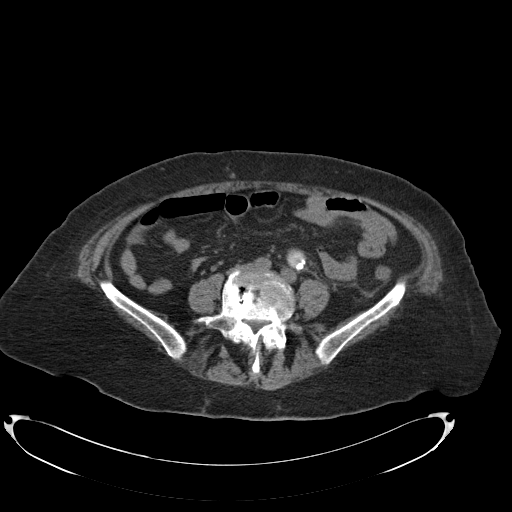
[im 77/168  soft-tissue]
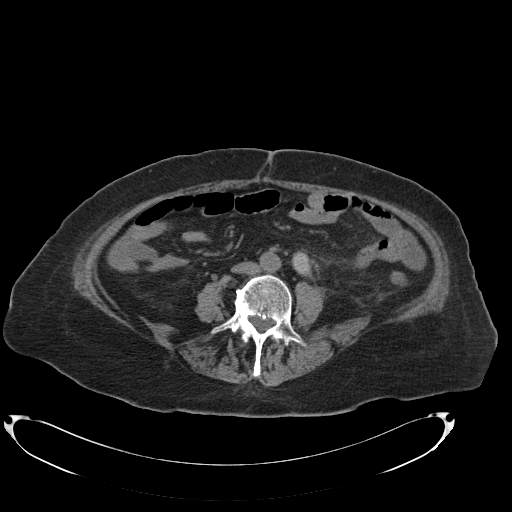
[im 91/168  soft-tissue]
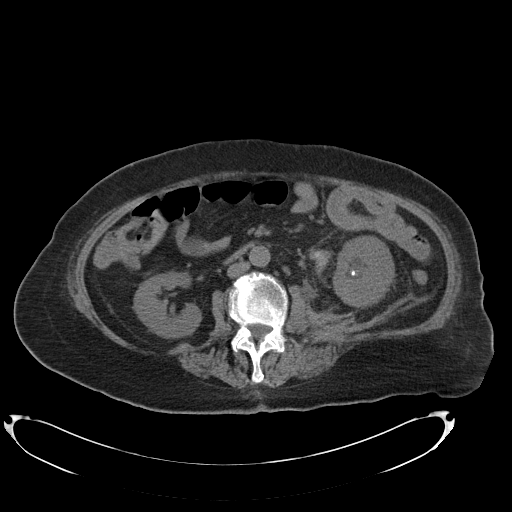
[im 98/168  soft-tissue]
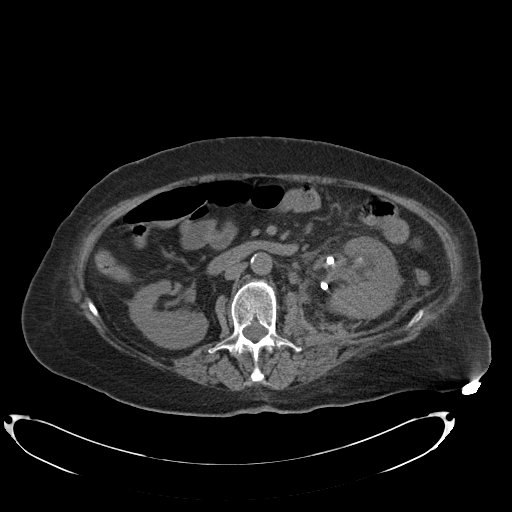
[im 98/168  bone]
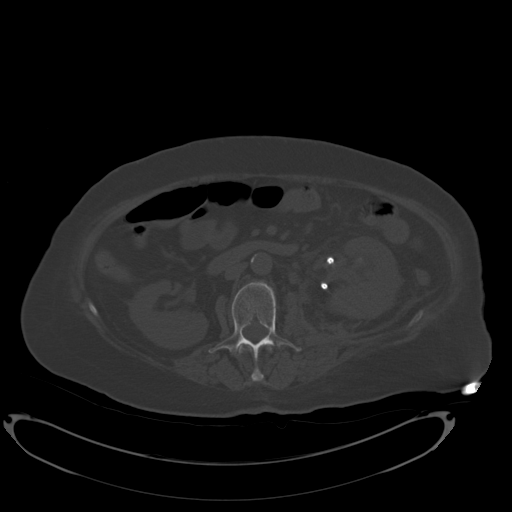
[im 112/168  soft-tissue]
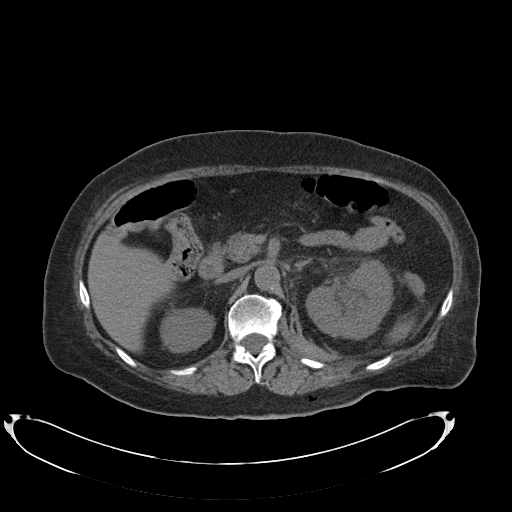
[im 126/168  soft-tissue]
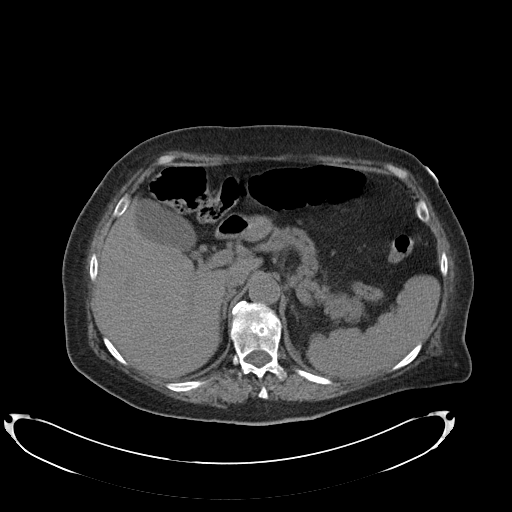
[im 133/168  soft-tissue]
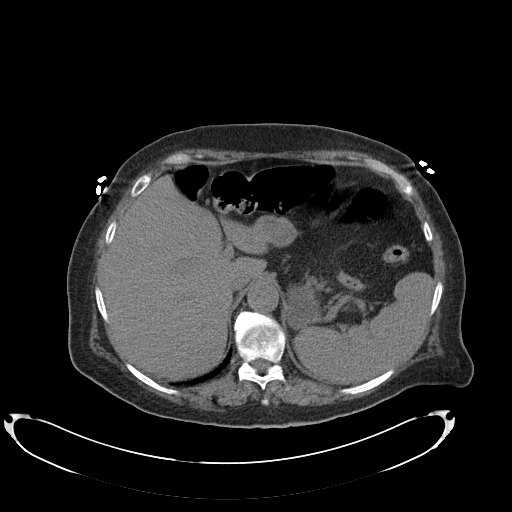
[im 140/168  lung]
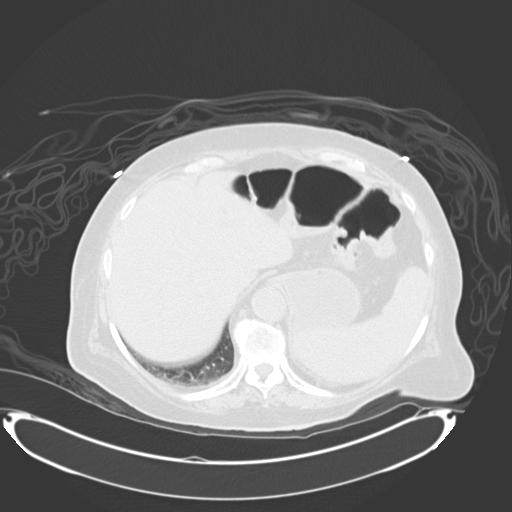
[im 147/168  soft-tissue]
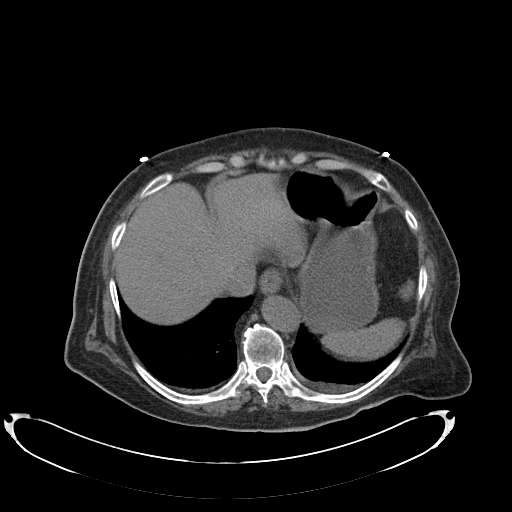
[im 147/168  lung]
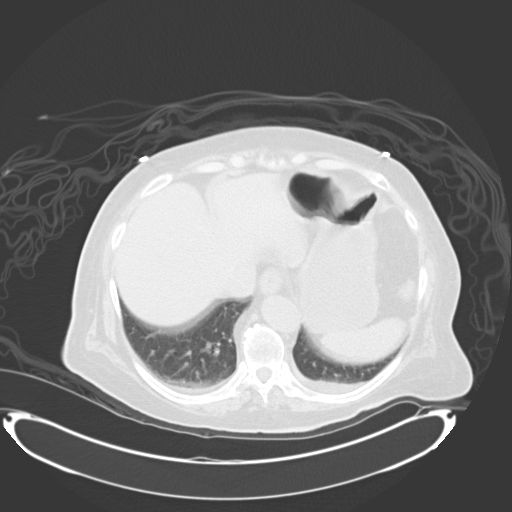
[im 154/168  lung]
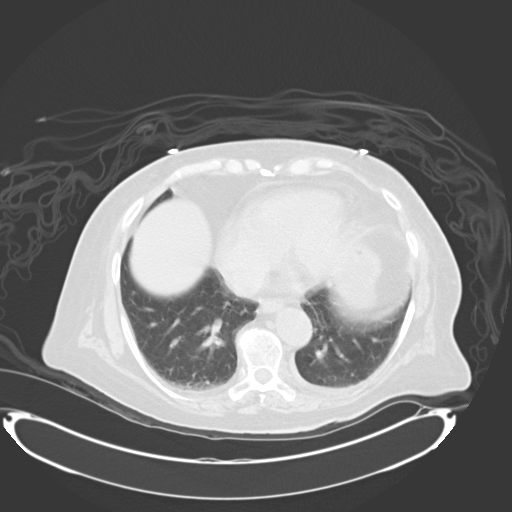
[im 161/168  soft-tissue]
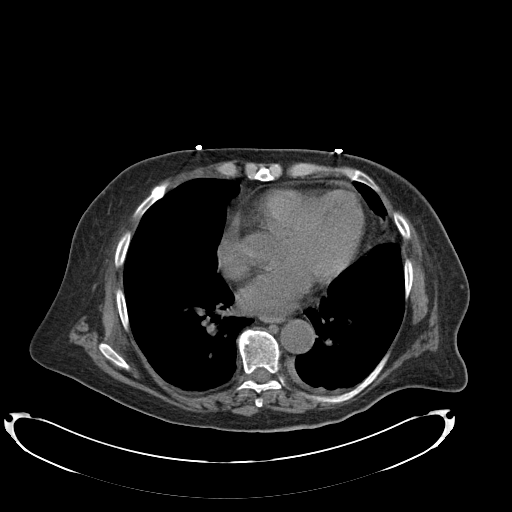
[im 161/168  lung]
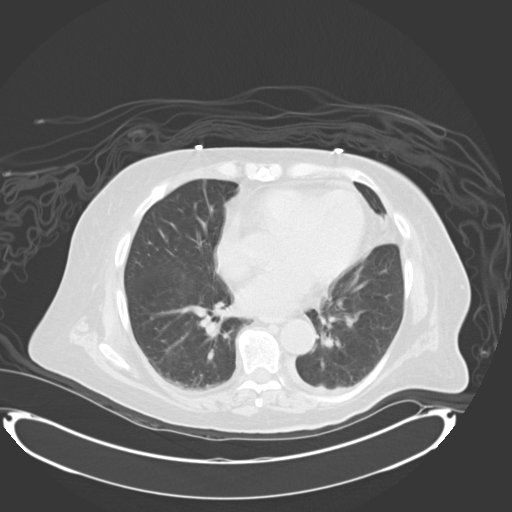

[16 of 32 positions shown; findings below may reference images not displayed]

PROCEDURE:     CT  - CT ABDOMEN AND PELVIS W[DATE]  [DATE]

RESULT:     Standard nonenhanced CT obtained. Liver normal. Spleen normal.
Pancreas normal. Nephrostomy tube noted coursing through the posterior
medial aspect of the kidney into the renal pelvis; the renal pelvis is
decompressed. Present identified hydronephrosis is decompressed. Ureteral
stone disease is noted. The ureteral stone disease is extensive. Large
stones in the distal ureter and upper ureter. Bladder is nondistended. Foley
noted. Small amount of air noted in the bladder. Right nephrolithiasis
noted. No bowel distention. Atelectasis.
IMPRESSION: Left Nephrostomy tube as described above. Multiple left
ureteral stones.

## 2013-06-18 ENCOUNTER — Ambulatory Visit: Payer: Self-pay

## 2013-07-23 DIAGNOSIS — R32 Unspecified urinary incontinence: Secondary | ICD-10-CM | POA: Insufficient documentation

## 2013-07-23 DIAGNOSIS — N200 Calculus of kidney: Secondary | ICD-10-CM | POA: Insufficient documentation

## 2013-07-23 DIAGNOSIS — M109 Gout, unspecified: Secondary | ICD-10-CM | POA: Insufficient documentation

## 2013-07-23 DIAGNOSIS — N952 Postmenopausal atrophic vaginitis: Secondary | ICD-10-CM | POA: Insufficient documentation

## 2013-07-23 DIAGNOSIS — I10 Essential (primary) hypertension: Secondary | ICD-10-CM | POA: Insufficient documentation

## 2013-07-23 DIAGNOSIS — D126 Benign neoplasm of colon, unspecified: Secondary | ICD-10-CM | POA: Insufficient documentation

## 2013-09-21 DIAGNOSIS — F0280 Dementia in other diseases classified elsewhere without behavioral disturbance: Secondary | ICD-10-CM | POA: Insufficient documentation

## 2013-09-21 DIAGNOSIS — G309 Alzheimer's disease, unspecified: Secondary | ICD-10-CM

## 2013-11-10 ENCOUNTER — Ambulatory Visit: Payer: Self-pay | Admitting: Internal Medicine

## 2013-12-15 ENCOUNTER — Ambulatory Visit: Payer: Self-pay | Admitting: Unknown Physician Specialty

## 2013-12-24 ENCOUNTER — Ambulatory Visit: Payer: Medicare Other | Admitting: General Surgery

## 2014-01-14 DIAGNOSIS — M2392 Unspecified internal derangement of left knee: Secondary | ICD-10-CM | POA: Insufficient documentation

## 2014-03-15 ENCOUNTER — Encounter: Payer: Self-pay | Admitting: General Surgery

## 2014-03-23 DIAGNOSIS — M1712 Unilateral primary osteoarthritis, left knee: Secondary | ICD-10-CM | POA: Insufficient documentation

## 2014-05-12 DIAGNOSIS — E042 Nontoxic multinodular goiter: Secondary | ICD-10-CM | POA: Insufficient documentation

## 2014-05-12 DIAGNOSIS — M810 Age-related osteoporosis without current pathological fracture: Secondary | ICD-10-CM | POA: Insufficient documentation

## 2014-05-12 DIAGNOSIS — E210 Primary hyperparathyroidism: Secondary | ICD-10-CM | POA: Insufficient documentation

## 2014-05-15 ENCOUNTER — Observation Stay: Payer: Self-pay | Admitting: Internal Medicine

## 2014-05-15 LAB — URINALYSIS, COMPLETE
Bilirubin,UR: NEGATIVE
Glucose,UR: NEGATIVE mg/dL
Nitrite: NEGATIVE
Ph: 6
Protein: 30
RBC,UR: 96 /HPF
Specific Gravity: 1.017
Squamous Epithelial: NONE SEEN
WBC UR: 189 /HPF

## 2014-05-15 LAB — COMPREHENSIVE METABOLIC PANEL
ALK PHOS: 99 U/L
Albumin: 2.7 g/dL — ABNORMAL LOW (ref 3.4–5.0)
Anion Gap: 8 (ref 7–16)
BUN: 24 mg/dL — AB (ref 7–18)
Bilirubin,Total: 1.2 mg/dL — ABNORMAL HIGH (ref 0.2–1.0)
CREATININE: 0.75 mg/dL (ref 0.60–1.30)
Calcium, Total: 9.8 mg/dL (ref 8.5–10.1)
Chloride: 104 mmol/L (ref 98–107)
Co2: 28 mmol/L (ref 21–32)
EGFR (African American): >60
EGFR (Non-African Amer.): >60
Glucose: 102 mg/dL — ABNORMAL HIGH (ref 65–99)
Osmolality: 284 (ref 275–301)
Potassium: 3.8 mmol/L (ref 3.5–5.1)
SGOT(AST): 68 U/L — ABNORMAL HIGH (ref 15–37)
SGPT (ALT): 54 U/L
Sodium: 140 mmol/L (ref 136–145)
Total Protein: 6.6 g/dL (ref 6.4–8.2)

## 2014-05-15 LAB — CBC
HCT: 40.9 %
HGB: 13.5 g/dL
MCH: 28.5 pg
MCHC: 33 g/dL
MCV: 86 fL
Platelet: 269 10*3/uL
RBC: 4.73 X10 6/mm 3
RDW: 14.5 %
WBC: 14.2 10*3/uL — ABNORMAL HIGH

## 2014-05-15 LAB — CK TOTAL AND CKMB (NOT AT ARMC)
CK, Total: 893 U/L — ABNORMAL HIGH (ref 26–192)
CK-MB: 13.4 ng/mL — ABNORMAL HIGH (ref 0.5–3.6)

## 2014-05-15 LAB — TROPONIN I: Troponin-I: <0.02 ng/mL

## 2014-05-16 ENCOUNTER — Ambulatory Visit: Payer: Self-pay | Admitting: Orthopedic Surgery

## 2014-05-16 LAB — TSH: THYROID STIMULATING HORM: 0.325 u[IU]/mL — AB

## 2014-05-16 LAB — CBC WITH DIFFERENTIAL/PLATELET
BASOS PCT: 0.1 %
Basophil #: 0 10*3/uL (ref 0.0–0.1)
Eosinophil #: 0 10*3/uL (ref 0.0–0.7)
Eosinophil %: 0.1 %
HCT: 40.7 % (ref 35.0–47.0)
HGB: 12.9 g/dL (ref 12.0–16.0)
LYMPHS ABS: 1 10*3/uL (ref 1.0–3.6)
LYMPHS PCT: 7.9 %
MCH: 28 pg (ref 26.0–34.0)
MCHC: 31.8 g/dL — ABNORMAL LOW (ref 32.0–36.0)
MCV: 88 fL (ref 80–100)
MONOS PCT: 6.5 %
Monocyte #: 0.8 x10 3/mm (ref 0.2–0.9)
NEUTROS ABS: 10.9 10*3/uL — AB (ref 1.4–6.5)
Neutrophil %: 85.4 %
Platelet: 267 10*3/uL (ref 150–440)
RBC: 4.62 10*6/uL (ref 3.80–5.20)
RDW: 14.6 % — ABNORMAL HIGH (ref 11.5–14.5)
WBC: 12.8 10*3/uL — AB (ref 3.6–11.0)

## 2014-05-16 LAB — BASIC METABOLIC PANEL
Anion Gap: 8 (ref 7–16)
BUN: 17 mg/dL (ref 7–18)
CO2: 27 mmol/L (ref 21–32)
Calcium, Total: 9 mg/dL (ref 8.5–10.1)
Chloride: 108 mmol/L — ABNORMAL HIGH (ref 98–107)
Creatinine: 0.66 mg/dL (ref 0.60–1.30)
EGFR (African American): >60
EGFR (Non-African Amer.): >60
Glucose: 106 mg/dL — ABNORMAL HIGH (ref 65–99)
OSMOLALITY: 287 (ref 275–301)
Potassium: 4.4 mmol/L (ref 3.5–5.1)
SODIUM: 143 mmol/L (ref 136–145)

## 2014-05-16 LAB — CK: CK, Total: 390 U/L — ABNORMAL HIGH (ref 26–192)

## 2014-05-18 LAB — T4, FREE: FREE THYROXINE: 1.31 ng/dL (ref 0.76–1.46)

## 2014-05-18 LAB — URINE CULTURE

## 2014-06-23 DIAGNOSIS — R0602 Shortness of breath: Secondary | ICD-10-CM | POA: Insufficient documentation

## 2014-06-23 DIAGNOSIS — R002 Palpitations: Secondary | ICD-10-CM | POA: Insufficient documentation

## 2014-08-01 ENCOUNTER — Emergency Department: Payer: Self-pay | Admitting: Emergency Medicine

## 2014-09-05 NOTE — Consult Note (Signed)
Brief Consult Note: Diagnosis: L ureterolithiasis, hydronephrosis. SIRS, pyonephrosis, bladder calculi.   Patient was seen by consultant.   Consult note dictated.   Recommend to proceed with surgery or procedure.   Orders entered.   Discussed with Attending MD.   Comments: Continue nephrostomy tube drainage until medically stable for surgery. Suggest lasix renogram with differential function to determine whether this kidney is functioning. Will need cystoscopic bladder stone removal and ureteroscopic lithotripsy.  Electronic Signatures: Royston Cowper (MD)  (Signed 22-Apr-13 12:58)  Authored: Brief Consult Note   Last Updated: 22-Apr-13 12:58 by Royston Cowper (MD)

## 2014-09-05 NOTE — Consult Note (Signed)
PATIENT NAME:  Kaitlin [name], Kaitlin [name] MR#:  [id number] DATE OF BIRTH:  January 04, 1935  DATE OF CONSULTATION:  09/01/2011  REFERRING PHYSICIAN:  Prime Doc  CONSULTING PHYSICIAN:  Makale Pindell D. Mikenna Bunkley, MD  INDICATION: Weakness, altered mental status.   HISTORY OF PRESENT ILLNESS: Ms. Gullickson is a 80 year old white female with history of dementia, hypertension, hyperlipidemia complaining of ongoing weakness, nausea, vomiting, and diarrhea. The patient states she has had recurrent symptoms, started on Monday, and has had multiple episodes of loose stools, 2 to 3 a day. Stools became yellow. The patient had nausea, vomiting up to three times in a day, had decreased p.o. intake, was mostly lying in bed with body aches. She got short of breath. No chest pain. Initially she had no fever, thought she felt hot and cold however. She is a very poor historian because of dementia. She was answering questions. Husband was helpful. She arrived to the hospital with acute renal failure, creatinine 2.3, elevated LFTs, troponin 0.2 without chest pain or abdominal pain but was admitted for further evaluation and care.   REVIEW OF SYSTEMS: Difficult to assess. No blackout spells. No syncope. She's had persistent nausea and vomiting. She has had no clear fevers. She may have had some chills. No sweats. No weight loss. No weight gain. No hemoptysis or hematemesis. No bright red blood per rectum.   PAST MEDICAL HISTORY: 1. Dementia.  2. Hypertension.  3. Hyperlipidemia.  4. Arthritis.  5. Allergies.   6. Chronic sinus trouble.  MEDICATIONS: 1. Amlodipine 5 a day.  2. Aricept 10 a day.  3. Aspirin 81 a day.  4. Black cohosh root one cap a day.  5. Claritin 10 a day.  6. Fosamax 70 mg a week.  7. Lescol XL 80 a day.  8. Potassium chloride 10 mEq a day.  9. Tart cherry 425 a day.   ALLERGIES: Augmentin, dust, tetracycline.  PAST SURGICAL HISTORY:  1. Sinus surgery. 2. Hysterectomy.   SOCIAL HISTORY: Lives with her  husband. No smoking or alcohol consumption. She is a distant smoker but quit multiple years ago.   FAMILY HISTORY: Negative.   PHYSICAL EXAMINATION:   VITAL SIGNS: Blood pressure 115/60, pulse 90, respiratory rate 16, afebrile.   GENERAL: Lying in bed, appears to be confused and weak.   HEENT: Normocephalic, atraumatic. Pupils equal and reactive to light.   NECK: Supple. No JVD, bruits, or adenopathy.   LUNGS: Clear to auscultation and percussion. No significant wheeze, rhonchi, or rales.   HEART: Regular rate and rhythm. No significant murmur, gallops, or rubs.   ABDOMEN: Benign. Positive bowel sounds. No rebound, guarding, or tenderness.   EXTREMITIES: No cyanosis, clubbing, or edema.   NEUROLOGIC: Difficult to assess, appears to be slightly confused, lethargic. She says she feels weak.   LABORATORY, DIAGNOSTIC, AND RADIOLOGICAL DATA: Glucose 97, BUN 42, creatinine 2.33, sodium 138, potassium 4.1, CO2 19, lipase 363. Troponin 0.2. CK-MB 2.6. Total 673. LFTs elevated alkaline phosphatase 175, AST 124, ALT 105, albumin 2.7, white count 14.6, hemoglobin 12.6, hematocrit 38.6, platelet count 198, neutrophil count 12.7. Flu test was negative.   Chest x-ray essentially negative.   EKG normal sinus rhythm, rate of 90, T wave inversions in 3, poor R wave progression, nonspecific ST-T wave changes.   ASSESSMENT:  1. Altered mental status. 2. Abnormal troponin. 3. Hypertension. 4. Obesity. 5. Gastroenteritis.  6. Hepatitis.   7. Renal insufficiency.  8. Renal failure.  9. Possible SIRS. 10. Sinus trouble.  PLAN:  1. Agree with admit. 2. Rule out for myocardial infarction.  3. Follow cardiac enzymes because of elevated troponins and change in overall mental status with weakness.  4. Follow-up renal insufficiency. May need rehydration. Get Nephrology input. 5. Will also consider respiratory problems. She has SIRS. Will continue management and therapy.  6. Hepatitis is also  concerning with increased LFTs. Will consider further evaluation whether GI input would be necessary.  7. Altered mental status possibly related to her illness but dementia also is playing a factor.  8. She has an abnormal EKG.  9. Continue GI therapy.    10. Continue current therapy. Hopefully the patient will respond with critical care input and therapy to help her response. There is no clear indication for broad-spectrum antibiotics unless for GI problems. Will evaluate for C. difficile and treat for nausea.  11. Continue sinus medication.   ____________________________ Loran Senters Clayborn Bigness, MD [email or web address]:drc D: 09/02/2011 13:01:00 ET T: 09/02/2011 13:53:09 ET JOB#: [id number]  cc: Leila Schuff D. Clayborn Bigness, MD, <Dictator> Yolonda Kida MD ELECTRONICALLY SIGNED 09/19/2011 22:52

## 2014-09-05 NOTE — Consult Note (Signed)
Brief Consult Note: Diagnosis: Severe Left Hydronephrosis, Ureteral stones, Elevated WBC, Renal failure.   Patient was seen by consultant.   Consult note dictated.   Recommend to proceed with surgery or procedure.   Orders entered.   Discussed with Attending MD.   Comments: 80 y.o. admitted with leukocytosis, mental status changes, and presumed urosepsis.  U/S and CT show severe left hydro with extensive left ureteral stone burden.  Recommend PCN over ureteral stent due to overall clinical picture, concern for be able to place a retrograde stent due to UVJ stone burden, and likely need for antegrade access in future when treating stones if indicated.  Electronic Signatures: Felicity Coyer (MD)  (Signed 20-Apr-13 14:23)  Authored: Brief Consult Note   Last Updated: 20-Apr-13 14:23 by Felicity Coyer (MD)

## 2014-09-05 NOTE — Op Note (Signed)
PATIENT NAME:  ERCILIA, BETTINGER MR#:  [id number] DATE OF BIRTH:  11-30-1934  DATE OF PROCEDURE:  09/17/2011  PREOPERATIVE DIAGNOSES:  1. Left obstructing stone with impaction in a ureterocele.  2. Multiple left ureteral stones.  3. Left nephrolithiasis.  4. Fecal impaction.   POSTOPERATIVE DIAGNOSES:  1. Left obstructing stone with impaction in a ureterocele.  2. Multiple left ureteral stones.  3. Left nephrolithiasis.  4. Fecal impaction.   PROCEDURE: 1. Unroofing and incision of left ureterocele.  2. Left ureteroscopic ureterolithotomy with holmium laser lithotripsy.  3. Nephroscopy with holmium laser lithotripsy of renal stones.  4. Double pigtail stent placement.  5. Fluoroscopy. 6. Disimpaction of fecal impaction.   SURGEON: Maryan Puls, M.D.   ANESTHETIST: Dennard Nip, M.D.   ANESTHESIA: General and local per surgeon.   INDICATIONS: See the dictated History and Physical. After informed consent, the patient requested the above procedures.   OPERATIVE SUMMARY: After adequate general anesthesia had been obtained, the patient was placed into the dorsal lithotomy position and the perineum was prepped and draped in the usual fashion. Fluoroscopy confirmed the presence of a large distal left stone and multiple ureteral stones as well as several left renal stones. At this point the 21 French cystoscope was coupled to the camera and placed into the bladder. The patient had a widely patent right orifice. She had an obvious ureterocele with impacted stone present within the ureterocele on the left side. At this point the 910-micron holmium laser fiber was passed through the scope and incision was made in the dome of the ureterocele exposing the stone. The stone was then disintegrated into multiple fragments with the holmium laser and fragments were either retrieved with the alligator forceps or with an Ellik evacuator. After all these fragments had cleared, a 0.035 Glidewire was  advanced up the left orifice and curled in the renal pelvis. The A[facility] mini ureteroscope was then advanced up the left orifice and at least five stones were encountered in the ureter, which were sequentially fragmented with the holmium laser 500-micron fiber. The scope was then advanced into the renal pelvis and several large stones greater than 10-mm size were encountered here and those were also fragmented with the 500-micron holmium fiber. At this point, the ureteroscope was removed and the cystoscope back-loaded over the guidewire, and a 6 x 26-cm double pigtail stent was advanced into the ureter under fluoroscopic guidance. The guidewire was removed, taking care to leave the stent in position. The bladder was drained and the cystoscope was removed. 10 mL of viscous Xylocaine was instilled within the urethra and the bladder. An attempt was made to pass a B and O suppository, but the patient had a large fecal impaction. The fecal impaction was manually disimpacted by myself and then the B and O suppository was placed. At this point the procedure was terminated and the patient was transferred to the recovery room in stable condition.  ____________________________ Otelia Limes. Yves Dill, MD [email or web address]:bjt D: 09/17/2011 12:37:14 ET T: 09/17/2011 12:45:41 ET JOB#: [id number]  cc: Otelia Limes. Yves Dill, MD, <Dictator> Royston Cowper MD ELECTRONICALLY SIGNED 09/17/2011 13:08

## 2014-09-05 NOTE — Op Note (Signed)
PATIENT NAME:  Kaitlin [name], Kaitlin [name] MR#:  [id number] DATE OF BIRTH:  [birth year]  DATE OF PROCEDURE:  10/01/2011  PREOPERATIVE DIAGNOSIS: Left ureterolithiasis.   POSTOPERATIVE DIAGNOSIS: Left ureterolithiasis.   PROCEDURES:  1. Left ureteroscopic ureterolithotomy with Holmium laser lithotripsy.  2. Left double pigtail stent removal.  3. Fluoroscopy.   SURGEON: Otelia Limes. Yves Dill, MD  ANESTHETIST: Dr. Andree Elk  ANESTHETIC METHOD: General.   INDICATIONS: See the dictated history and physical. After informed consent, patient requests above procedure.   OPERATIVE SUMMARY: After adequate general anesthesia had been obtained, patient was placed into dorsal lithotomy position and the perineum was prepped and draped in the usual fashion. Fluoroscopy was performed confirming presence of left double pigtail stent. There were several stone fragments present in the distal left ureter. At this point the 21 French cystoscope was coupled with the camera and placed into the bladder. Bladder was inspected and no lesions were identified. The double pigtail stent was identified. Left orifice was identified a 0.035 Glidewire advanced up the left ureter under fluoroscopic guidance. At this point the stent was removed with alligator forceps taking care to leave the guidewire in position. The mini rigid ureteroscope was then advanced into the left orifice up to the stones. The 360 micron Holmium laser fiber was advanced through the scope and the stones were fragmented into fragments smaller than 1 mm. At this point ureteroscope was removed and guidewire was removed. Fluoroscopy confirmed no significant stone fragments were present.     Procedure was then terminated and the patient was transferred to the recovery room in stable condition.   ____________________________ Otelia Limes. Yves Dill, MD [email or web address]:cms D: 10/01/2011 08:41:16 ET T: 10/01/2011 09:45:12 ET JOB#: [id number]  cc: Otelia Limes. Yves Dill, MD, <Dictator> Royston Cowper  MD ELECTRONICALLY SIGNED 10/01/2011 11:26

## 2014-09-05 NOTE — H&P (Signed)
PATIENT NAME:  Kaitlin [name], TURKINGTON MR#:  [id number] DATE OF BIRTH:  05/06/1935  DATE OF ADMISSION:  09/17/2011  CHIEF COMPLAINT: Kidney stones.   HISTORY OF PRESENT ILLNESS: Ms. Vallo is a 80 year old Caucasian female who presented with urinary sepsis, acute renal failure and left ureteral obstruction in April. She underwent nephrostomy tube placement. On nephrostogram she was found to have multiple stones present within the left ureter and massively dilated ureter. She appeared to have a large stone lodged in a ureterocele. She comes in now for left ureteroscopic ureterolithotomy with Holmium laser lithotripsy.   ALLERGIES: Patient allergic to Augmentin and tetracycline.   CURRENT MEDICATIONS:  1. Amlodipine. 2. Aricept. 3. Aspirin. 4. Claritin. 5. Fosamax. 6. Lescol XL. 7. Potassium chloride.   PAST SURGICAL HISTORY:  1. Sinus surgery.  2. Hysterectomy.   SOCIAL HISTORY: She denied tobacco or alcohol use.   FAMILY HISTORY: Noncontributory.   PAST AND CURRENT MEDICAL CONDITIONS:  1. Hypertension.  2. Dementia.  3. Hyperlipidemia.  4. Arthritis. 5. Allergic rhinitis.  6. Chronic sinusitis.   REVIEW OF SYSTEMS: Patient denied chest pain, shortness of breath, diabetes or stroke.   PHYSICAL EXAMINATION:  GENERAL: Elderly white female in no acute distress.   HEENT: Sclerae were clear. Pupils were equal, round, reactive to light and accommodation. Extraocular movements were intact.   NECK: Supple. No palpable cervical adenopathy.   LUNGS: Clear to auscultation.   CARDIOVASCULAR: Regular rhythm and rate without audible murmurs.   ABDOMEN: Soft, nontender abdomen. Left nephrostomy tube was in good position.   GU/RECTAL: Deferred.   NEUROMUSCULAR: Nonfocal. She was alert and oriented but did have obvious signs of dementia.   IMPRESSION:  1. Left ureterocele with obstructing stone and multiple other stones in the left ureter.  2. Recent urinary sepsis due to #1.   PLAN:  Left ureteroscopic ureterolithotomy with Holmium laser lithotripsy.   ____________________________ Otelia Limes. Yves Dill, MD [email or web address]:cms D: 09/13/2011 09:40:48 ET T: 09/13/2011 10:08:54 ET JOB#: [id number]  cc: Otelia Limes. Yves Dill, MD, <Dictator> Royston Cowper MD ELECTRONICALLY SIGNED 09/13/2011 16:44

## 2014-09-05 NOTE — Consult Note (Signed)
PATIENT NAME:  Kaitlin [name], Kaitlin [name] MR#:  [id number] DATE OF BIRTH:  06/13/1934  DATE OF CONSULTATION:  09/01/2011  REFERRING PHYSICIAN:  Theodoro Grist, MD  CONSULTING PHYSICIAN:  Juliann Mule. Deliana Avalos, II, MD  REASON FOR CONSULTATION: Severe hydronephrosis, acute renal failure, possible urosepsis.   HISTORY OF PRESENT ILLNESS: The patient is a 80 year old Caucasian female with a known history of dementia, hypertension, and hyperlipidemia who presented to the ER and was admitted on 04/19 for weakness, nausea, vomiting, and diarrhea which started five days prior. She had multiple episodes of these spells, 2 to 3 today, decreased p.o. intake and some body aches. She denied shortness of breath or chest pain. She did not have a fever. Her husband gives most of her story both on admission and today due to ongoing dementia issues. On arrival she was found to have acute renal failure with creatinine of 2.3 with elevated LFTs as well. Although she is a poor historian, she does admit to some flank pain. She was subsequently admitted and ultrasound showed left hydroureteronephrosis and possible left ureteral stone. I was consulted and subsequently ordered a CT scan prior to my evaluation.   PAST MEDICAL HISTORY:  1. Hypertension.  2. Dementia.  3. Hyperlipidemia.  4. Arthritis. 5. Seasonal allergies. 6. Chronic sinus issues.    MEDICATIONS: Up-to-date and reviewed in detail in the chart. The patient has been started on antibiotics here.   ALLERGIES: Augmentin, tetracycline.  PAST SURGICAL HISTORY:  1. Sinus surgery. 2. Hysterectomy.  SOCIAL HISTORY: No tobacco or alcohol.   FAMILY HISTORY: Hypertension.  REVIEW OF SYSTEMS: The patient denies any fever or chills. She does admit to some flank pain and back pain recently but is unsure which side.    PHYSICAL EXAMINATION:  CURRENT VITAL SIGNS: Temperature 36.6, heart rate 83, respiratory rate 18, blood pressure 119/76, 96% sats at room air.   GENERAL:  She is alert and oriented to place and person although she asks multiple questions over and over again and is somewhat perseverative.   HEENT: Normocephalic, atraumatic.  NECK: Supple without any lymphadenopathy.   CARDIOVASCULAR: Regular rate and rhythm.   ABDOMEN: Soft, nondistended. She has tenderness to palpation in the left upper quadrant which is non-peritoneal.   EXTREMITIES: No cyanosis, clubbing, or edema.   GU: Deferred.   PSYCH: Awake and alert, pleasant and conversational.   LABORATORY VALUES: The patient's cultures are pending. Her creatinine is 2.28, stable from 2.33 yesterday. Her current white blood cell count is 16.5, up from 14.6 yesterday.   IMAGING: CT report from today, preliminary report, full read. Images were also reviewed by myself showing severe hydronephrosis of the left kidney including pelviectasis and hydroureter. In the proximal left ureter in the dependent portion there is a 1.8 and a 1.0 calculus and within the mid left distal ureter there are multiple areas of stones from 4 to 8 mm corresponding with Steinstrasse type situation. She has coarse calcifications within the base of the bladder which appear to project at the level of the UPJ junction. There are two calculi measuring over a centimeter apiece each in that area.   ASSESSMENT: This is a 80 year old female with sepsis and fears currently admitted to the medical service, started on Levaquin with ultrasound and subsequently CT showing severe left-sided hydronephrosis with very large and extensive ureteral stone burden and possible extensive UVJ versus bladder stone burden as well. Given this patient's history of sepsis and severe obstruction, she would benefit from drainage of her renal  collecting system. Given the patient's overall clinical course in the setting of sepsis with leukocytosis as well as her decreased likelihood of being able to perform a ureteral stent due to UVJ stone burden, steinstrasse, and  the potential benefit of antegrade access in dealing with her large proximal stone burden as well, would recommend PCN tube placement initially for this patient followed by definitive stone and kidney management at a later date.   RECOMMENDATIONS ARE AS FOLLOWS:  1. Continue IV fluids, IV resuscitation, antibiotics per primary team.  2. Left percutaneous nephrostomy tube placement. This was fully discussed with radiologist as well.  3. Definitive renal function evaluation in the form of a Lasix renal scan as well as discussion of how to treat her ureteral stone burden as well as likely need for cystoscopy to evaluate whether these are bladder stones or ureterovesical junction stones should be handled in the future once she has resolved her acute medical issues.   Plan was discussed with the primary care team.  ____________________________ Juliann Mule. Nobie Putnam, MD erh:drc D: 09/01/2011 16:57:28 ET T: 09/02/2011 08:09:23 ET JOB#: [id number]  cc: Juliann Mule. Nobie Putnam, MD, <Dictator> Carroll Sage MD ELECTRONICALLY SIGNED 09/02/2011 9:33

## 2014-09-05 NOTE — Discharge Summary (Signed)
PATIENT NAME:  ALAISA, Kaitlin [name] MR#:  [id number] DATE OF BIRTH:  07-Mar-1935  DATE OF ADMISSION:  08/31/2011 DATE OF DISCHARGE:  09/04/2011  ADMITTING DIAGNOSIS: Systemic inflammatory response reaction/sepsis.   DISCHARGE DIAGNOSES: 1. Systemic inflammatory response reaction.  2. Acute pyelonephritis, Escherichia coli.  3. Left hydronephrosis, hydroureter, kidney stones.  4. Status post nephrostomy placement on 09/01/2011 by Dr. Burt Knack.  5. Hypotension due to systemic inflammatory response reaction.  6. Severe protein malnutrition with prealbumin of 7.  7. History of hypertension, dementia, hyperlipidemia, and arthritis as well as allergies.   DISCHARGE CONDITION: Stable.   DISCHARGE MEDICATIONS: The patient is to resume her outpatient medications. 1. Lescol XL 80 mg p.o. daily.  2. Aspirin 81 mg p.o. daily.  3. Aricept 10 mg p.o. daily.  4. Fosamax 70 mg p.o. weekly.  5. Black cohosh root one capsule once daily.  6. Claritin 10 mg p.o. daily.  7. Tart cherry 425 mg p.o. daily as needed.  8. Potassium chloride 10 mEq p.o. daily.   ADDITIONAL MEDICATIONS: Levaquin 500 mg p.o. daily for 10 more days.   NOTE: The patient was advised to hold her amlodipine until recommended by her primary care physician.   DIET: 2 gram salt, low fat, soft diet.   ACTIVITY LIMITATIONS: As tolerated.  REFERRALS: The patient will be referred to home health physical therapy as well as Therapist, sports.   DISCHARGE FOLLOWUP: Follow-up appointment with Dr. Quay Burow in two days after discharge as well as Dr. Yves Dill, urology, in two days after discharge.   CONSULTANTS:  1. Maryan Puls, MD - Urology.  2. Lujean Amel, MD - Cardiology. 3. Leona Singleton, MD - Urology.   RADIOLOGIC STUDIES: Chest portable single view on 08/31/2011 showed no acute changes.   Ultrasound of abdomen, general survey, on 08/31/2011: Moderate hydronephrosis as well as moderate hydroureter on the left. Ureter measures 2.07 cm in diameter.  Within the proximal left ureter, a 1.5 mm calculus is identified with acoustic shadowing. Not mentioned above, there are findings which may represent left pleural effusion. No sonographic evidence of cholecystitis, according to the radiologist.   CT scan of the abdomen and pelvis without contrast, on 09/01/2011:  Large burden of calculus disease within the left urinary collecting system with associated severe obstructive uropathy. These findings were discussed with the hospitalist. Nonspecific findings in the lung bases, particularly within the left lung base. Incidentally noted is diverticulosis in the sigmoid colon.   CT of the abdomen and pelvis without contrast, repeated after nephrostomy tube placement, on 09/01/2011: Pre nephrostomy tube evaluation demonstrating no anatomic variability which would preempt nephrostomy tube placement.   CT of abdomen and pelvis without contrast after tube place, on 09/01/2011: Left nephrostomy tube with multiple left ureteral stones.   Kidney function with Lasix, study on 09/04/2011, revealed decreased function of left kidney is noted. However approximately 38% of the overall renal is by the left kidney, according to initial images. There is evidence of some obstructive change probably by the previously demonstrated distal left ureteral stones at the bladder. The patient has an indwelling left nephrostomy tube which was clamped for this study.   HISTORY OF PRESENT ILLNESS: The patient is a 80 year old Caucasian female with past medical history significant for history of hypertension and dementia as well as hyperlipidemia who presented to the hospital with complaints of significant weakness. Please refer to Dr. Lauris Chroman admission note on 08/31/2011. On arrival to the emergency room, the patient's temperature was 96.5, pulse 84, blood pressure  112/61, and saturation was 96% on oxygen therapy and respiration rate was 20. Her physical exam was unremarkable,  however.   LABS/STUDIES: On 08/31/2011 BUN and creatinine were 42 and 2.33. Bicarbonate level was low at 19. Glucose was around 100. Estimated GFR for non-African American would be 20. The patient's liver enzymes showed elevated AST of 124 and ALT of 105, respectively. Alkaline phosphatase was high at 175. Albumin level was 2.7. Cardiac enzymes: Troponin on admission was 0.20. The second set showed troponin level of 0.12 and third set 0.13. White blood cell count was elevated to 14.6, hemoglobin 12.6, and platelet count 198 with elevated absolute neutrophil count to 12.7. Influenza test was negative.   Blood cultures x2 did not show any growth.   Urinalysis was remarkable for pyuria, elevated leukocyte esterase count as well as red blood cell count.   Urine cultures revealed Escherichia coli, more than 100,000 colony-forming units sensitive to nitrofurantoin as well as cefazolin, ceftriaxone, ciprofloxacin, gentamicin, imipenem, sulfamethoxazole as well as levofloxacin. However, it was intermediately to cefoxitin and resistant to ampicillin.  HOSPITAL COURSE: The patient was admitted to the hospital. She was started on antibiotic therapy with Levaquin and because her somewhat deteriorated, she became hypotensive requiring high IV fluid load boluses, she was transferred to the Critical Care Unit.   In regards to systemic antiinflammatory response reaction, it was felt that the patient's SIRS is related to acute pyelonephritis and it is due to acute pyelonephritis. It was treated with antibiotics as well as supportive therapy and the patient's condition significantly improved. By the day of discharge, her vital signs are stable with temperature of 98, pulse 81, respiration rate 20, blood pressure 128/82, and saturation 94% on room air at rest.   In regards to acute pyelonephritis, the patient had poor oral intake and abdominal discomfort as well as CVA tenderness on percussion. Her urine cultures were  positive for Escherichia coli from her bladder as well as from nephrostomy tube. She was continued on antibiotic therapy, initially on two antibiotics, Rocephin as well as Levaquin, however, later on when the patient's culture ID as well as sensitivities came back, the patient was switched to Levaquin alone. She did progressively better and now she is going to be discharged on Levaquin orally. She is to take antibiotics for 10 more days to complete course. She is to follow up with her primary care physician as well as urologist, Dr. Yves Dill, in the next few days after discharge. As mentioned above, she was seen by Dr. Valma Cava as well as Dr. Yves Dill, urologist, and recommended nephrostomy tube which was placed by Dr. Burt Knack on 09/01/2011. After nephrostomy tube placement, the patient's kidney function significantly improved. Initially, as mentioned above, she was in acute renal failure with creatinine level of 2.33, on 08/31/2011. Even with IV fluids administration, the patient's kidney function did not improve. However, after nephrostomy tube was placed, the patient's kidney function normalized. On 09/04/2011, the patient's BUN and creatinine were 21 and 1.01, respectively. It is recommended to follow the patient's kidney function closely and make decisions about referring her to a nephrologist if needed. The patient was evaluated by Dr. Yves Dill who felt the patient would benefit from kidney stone removal, however, because it was unclear of the patient's left kidney function, the patient underwent nuclear study, renogram, which revealed 38% of overall kidney function on the left. At this point,  I feel the patient would benefit from kidney evaluation for kidney stone removal. She is to follow-up  with Dr. Yves Dill for further recommendations. She is to continue antibiotic therapy for now.   In regards to hypotension, the patient's blood pressure management required high rate IV fluid administration, however, the patient did  well with no pressors. She overall had 11,600 mL of IV fluids or oral intake and had good urine output. She was advised to continue good oral input, especially drink plenty of fluids, because now after removal of obstruction the patient does have some polyuria.   The patient was noted to have severe protein malnutrition. Albumin level was noted to be very low. With rehydration, on 09/04/2011, the patient's albumin level was 1.7. Prealbumin level was also very low at 7. The patient was advised to continue a good diet and it is recommended to follow her nutrition as an outpatient and see a gastroenterologist if her oral intake does not improve.   The patient was noted to have acute renal failure. As mentioned above, the patient's kidney function improved as soon as nephrostomy tube was placed signifying that in fact obstruction had major input to her kidney failure.   For kidney stones, as mentioned above, the patient will be seen by Dr. Yves Dill and recommended therapy according to Dr. Letta Kocher knowledge.   For history of hypertension, the patient's blood pressure medications will be placed on hold at this time. She is to resume her blood pressure medications when her blood pressure is stable or high. On the day of discharge, the patient's blood pressure is relatively low for her. She is to continue good oral intake, especially fluids, and follow up with her primary care physician and make decisions about reinitiation of Norvasc as needed.   For dementia, hyperlipidemia, arthritis, and allergies, she is to continue her outpatient medications. She is being discharged in stable condition with the above-mentioned medications and follow-up.   TIME SPENT: 40 minutes.  ____________________________ Theodoro Grist, MD rv:[id number] D: 09/04/2011 16:35:49 ET T: 09/05/2011 10:17:07 ET JOB#: [id number]  cc: Theodoro Grist, MD, <Dictator> Ellamae Sia, MD Otelia Limes. Yves Dill, MD Dorothy Landgrebe Ether Griffins MD ELECTRONICALLY SIGNED  09/06/2011 20:18

## 2014-09-05 NOTE — Consult Note (Signed)
Chief Complaint:   Subjective/Chief Complaint Pt states to c/o weakness and fatigue. She has hypotension. no pain.   VITAL SIGNS/ANCILLARY NOTES: **Vital Signs.:   21-Apr-13 08:00   Celsius 97.5   Temperature Source oral   Pulse Pulse 76   Respirations Respirations 18   Systolic BP Systolic BP 91   Diastolic BP (mmHg) Diastolic BP (mmHg) 56   Mean BP 67   Pulse Ox % Pulse Ox % 96   Pulse Ox Activity Level  At rest   Oxygen Delivery Room Air/ 21 %   Pulse Ox Heart Rate 74  *Intake and Output.:   21-Apr-13 09:00   Grand Totals Intake:  390 Output:      Net:  390 24 Hr.:  540   Oral Intake      In:  240   Percentage of Meal Eaten  10   IV (Primary)      In:  150   Brief Assessment:   Cardiac Regular  murmur present  -- LE edema  -- JVD    Respiratory normal resp effort  clear BS  rhonchi    Gastrointestinal Normal    Gastrointestinal details normal Soft  Nontender  Nondistended  No masses palpable  Bowel sounds normal   Routine Hem:  21-Apr-13 04:00    WBC (CBC) 25.2   RBC (CBC) 3.79   Hemoglobin (CBC) 10.6   Hematocrit (CBC) 32.8   Platelet Count (CBC) 82   MCV 87   MCH 27.9   MCHC 32.3   RDW 14.9  Routine Chem:  21-Apr-13 04:00    Glucose, Serum 185   BUN 36   Creatinine (comp) 1.72   Sodium, Serum 139   Potassium, Serum 4.0   Chloride, Serum 109   CO2, Serum 21   Calcium (Total), Serum 9.3  Hepatic:  21-Apr-13 04:00    Bilirubin, Total 1.6   Alkaline Phosphatase 154   SGPT (ALT) 58   SGOT (AST) 58   Total Protein, Serum 5.1   Albumin, Serum 1.7  Routine Chem:  21-Apr-13 04:00    Osmolality (calc) 291   eGFR (African American) 33   eGFR (Non-African American) 29   Anion Gap 9  Routine Hem:  21-Apr-13 04:00    Neutrophil % 91.2   Lymphocyte % 2.8   Monocyte % 5.9   Eosinophil % 0.0   Basophil % 0.1   Neutrophil # 23.0   Lymphocyte # 0.7   Monocyte # 1.5   Eosinophil # 0.0   Basophil # 0.0  Routine Coag:  21-Apr-13 07:42     Prothrombin 14.8   INR 1.1   Activated PTT (APTT) 34.9  Blood Glucose:  21-Apr-13 07:50    POCT Blood Glucose 142   Radiology Results: XRay:    19-Apr-13 13:17, Chest Portable Single View   Chest Portable Single View    REASON FOR EXAM:    viral syndrome  COMMENTS:       PROCEDURE: DXR - DXR PORTABLE CHEST SINGLE VIEW  - Aug 31 2011  1:17PM     RESULT:     The lung fields are clear. No pneumonia, pneumothorax or pleural effusion   is seen. The heart size is normal. Monitoring electrodes are present.    IMPRESSION: No acute changes are identified.      Thank you for this opportunity to contribute to the care of your patient.       Verified By: Dionne Ano WALL, M.D., MD  [place]:  19-Apr-13 17:16, US Abdomen General Survey   US Abdomen General Survey    REASON FOR EXAM:    elevated lfts, renal failre. eval for biliary dz or   hydronehrosis  COMMENTS:       PROCEDURE: [place]  - US ABDOMEN GENERAL SURVEY  - Aug 31 2011  5:16PM     RESULT:  The liver demonstrates a homogeneous echotexture. Hepatopetal   flow is identified within the portal vein. There is no evidence of   ascites. The aorta and IVC are unremarkable. The pancreas is unremarkable.    IMPRESSION:     1. Moderate hydronephrosis as well as moderate hydroureter. The ureter   measures 2.07 cm in diameter. Within the proximal left ureter, a 1.5 mm   calculus is identified with acoustic shadowing.   2. Not mentioned above, there are findings which may represent a left   pleural effusion.  3. No sonographic evidence of cholecystitis.    Thankyou for the opportunity to contribute to the care of your patient.           Verified By: Mikki Santee, M.D., MD  Vascular:    20-Apr-13 17:10, Tube Placement - Nephrostomy   Tube Placement - Nephrostomy    PRELIMINARY REPORT    The following is a PRELIMINARY Radiology report.  A final report will follow pending radiologist verification.      REASON FOR EXAM:     sepsis, UTI/pyelonephroitis, hydronephrosis,   urolithiasis  COMMENTS:       PROCEDURE: VAS - NEPHROSTOMY TUBE PLACEMENT  - Sep 01 2011  5:10PM     RESULT:  The patient was informed of the risks and benefits of the   procedure and proper informed consent was obtained. A prone CT was   obtained prior to the study to evaluate anatomy. There are no findings   which would preempt nephrostomy tube placement. The left kidney is   severely hydronephrotic and coarse. Multiple coarse stones were   identified within the left system.    The patient was placed in a prone position. The overlying soft tissues   were prepped and draped in the usual sterile fashion. The patient     received conscious sedation via titrated IV doses of Fentanyl and Versed.   The patient received 1 mg of Versed at 15:50 hours and the second at   16:18 hours. 50 mcg of fentanyl was administered at 15:47 hours. The   patient received 1 gram of Ancef at 15:40 hours prior to the procedure.   All of these medications were administered via IV.    A proper entry site was established. The overlying soft tissues were   prepped and draped in the usual sterile fashion. The renal pelvis was   cannulated.  The pelvis was then injected with approximately 5 mL of a   solution of 50% radiopaque contrast and sterile saline. Proper entry was   established for placement of a 12 [place] multipurpose drainage catheter.   Access was established within the kidney with a 6 [place] micropuncture.   Access was maintained with an Amplatz guidewire. The micropuncture was   removed. The path was dilated to 10 [place]. A 12 French tube was placed   over the Amplatz guidewire. Inner stiffener was removed. Catheter was     coped within the renal pelvis. The catheter was then secured with sterile   StatLock and sterile dressing. 70 mL of pus was drained from the   collecting system.  This was followed by drainage of blood tinged urine.   The patient  tolerated the procedure without complications. The catheter   was secured with sterile StatLock and sutures. Sterile dressing was   placed on the site. Appropriate orders were placed on the chart for   evaluation. The patient did experience rigors and elevated temperature   during and at the conclusion of the study. An episode of hypertension was   also experienced. Due to these factors, the patient was transferred to   the CCU for monitoring. These findings were relayed to Dr. Ether Griffins, the   patient's floor attending.    IMPRESSION:      Fluoroscopically-guided nephrostomy tube placement as described above.  Thank you for the opportunity to contribute to the care of your patient.           Dictated By: Mikki Santee, M.D., MD  Cardiology:    19-Apr-13 11:45, ED ECG   Ventricular Rate 88   Atrial Rate 88   P-R Interval 150   QRS Duration 84   QT 370   QTc 447   P Axis 16   R Axis 2   T Axis 6   ECG interpretation    Normal sinus rhythm  Cannot rule out Inferior infarct , age undetermined  Anterior infarct , age undetermined  Abnormal ECG  When compared with ECG of 30-Jan-2007 14:09,  No significant change was found  ----------unconfirmed----------  Confirmed by OVERREAD, NOT ([id number]), editor PEARSON, Jillene ([id number]) on 09/03/2011 8:56:16 AM   ED ECG   CT:    20-Apr-13 11:17, CT Abdomen and Pelvis Without Contrast   CT Abdomen and Pelvis Without Contrast    REASON FOR EXAM:    (1) Hydronephrosis and Ureteral stone; (2)   Hydronephrosis and Ureteral stone  COMMENTS:       PROCEDURE: CT  - CT ABDOMEN AND PELVIS W0  - Sep 01 2011 11:17AM     RESULT:     Technique: Helical noncontrasted 3 mm sections were obtained from the   lung bases through the pubic symphysis.    Findings: Evaluation of the lung bases demonstrates trace bilateral   effusions and minimal hypoventilation. A mild focal area of increased   density projects in the region of the base of the lingula.  Differential   considerations are atelectasis versus infiltrate. Similar findings are     identified within the posterior medial aspect of the left lower lobe.    Evaluation of the left kidney demonstrates severe hydronephrosis as well   as severe pelviectasis and hydroureter. In the proximal left ureter in a   dependent lateral position, a coarse 1.8 cm and a 1.0 cm calculus is   identified. Within the distal left ureter just proximal to the   uterovesical junction, multiple calculi identified in a Steinstrasse   orientation ranging in size from 4 to 8 mm. Coarse calcifications are   also identified within the base of the bladder which appear to project at   the level of the ureterovesical junction. There appears to be two calculi  measuring approximately 1.2 x 1.3 cm in diameter. There is mild stranding   within the perinephric fat as well as edema within the renal parenchyma.    Noncontrast evaluation of the liver, spleen, right kidney, adrenals and   pancreas is unremarkable. There is no CT evidence of bowel obstruction or     secondary signs reflecting enteritis, colitis, diverticulitis, or   appendicitis. There is  no evidence of an abdominal aortic aneurysm.    IMPRESSION:      Large burdened of calculus disease within the left urinary collecting   system with associated severe obstructive uropathy. These findings were   discussed with Dr. Ether Griffins of the Hospitalist service.  2. Nonspecific findings within the lung bases particularly within the   left lung base.  3. Incidental note is made of diverticulosis within the sigmoid colon.      Thank you for the opportunity to contribute to the care of your patient.       Verified By: Mikki Santee, M.D., MD    20-Apr-13 15:21, CT Abdomen and Pelvis Without Contrast   CT Abdomen and Pelvis Without Contrast    REASON FOR EXAM:    (1) hydronephrosis; (2) hydronephrosis-nephrostomy   tube placement  COMMENTS:        PROCEDURE: CT  - CT ABDOMEN AND PELVIS W0  - Sep 01 2011  3:21PM     RESULT:     Technique: Helical noncontrasted prone 3 mm sections were obtained from   the lung bases through the pubic symphysis.    Findings: Severe hydronephrosis is once again identified within the left   kidney. There is no evidence of retroperitoneal structures in the region   of the left kidney which would preempt nephrostomy tube placement. The   study is otherwise unchanged from prior CT.  IMPRESSION:     Pre nephrostomy tube evaluation demonstrating no anatomic variability   which would preempt nephrostomy tube placement.    Thank you for the opportunity to contribute to the care of your patient.             Verified By: Mikki Santee, M.D., MD    20-Apr-13 17:48, CT Abdomen and Pelvis Without Contrast   CT Abdomen and Pelvis Without Contrast    REASON FOR EXAM:    (1) tube placement; (2) tube placement  COMMENTS:       PROCEDURE: CT  - CT ABDOMEN AND PELVIS W0  - Sep 01 2011  5:48PM     RESULT: Standard nonenhanced CT obtained. Liver normal. Spleen normal.   Pancreas normal. Nephrostomy tube noted coursing through the posterior   medial aspect of the kidney into the renal pelvis; the renal pelvis is   decompressed. Present identified hydronephrosis is decompressed. Ureteral   stone disease is noted. The ureteral stone disease is extensive. Large   stones in the distal ureter and upper ureter. Bladder is nondistended.   Foley noted. Small amount of air noted in the bladder. Right   nephrolithiasis noted. No bowel distention. Atelectasis.    IMPRESSION:  Left Nephrostomy tube as described above. Multiple left     ureteral stones.          Verified By: Osa Craver, M.D., MD   Assessment/Plan:  Invasive Device Daily Assessment of Necessity:   Does the patient currently have any of the following indwelling devices? none   Assessment/Plan:   Assessment IMP Elevated  troponins NQMI? Acute Renal failure Hydronephosis Pyelonephitis Hypotension SIRS AMS    Plan PLAN Antibx IV F/U troponins Fluids Nephology input Anticoug for now ECHO Continue ICU care Hold Bp meds Agree with in and out foley   Electronic Signatures: Lujean Amel D (MD)  (Signed 22-Apr-13 11:14)  Authored: Chief Complaint, VITAL SIGNS/ANCILLARY NOTES, Brief Assessment, Lab Results, Radiology Results, Assessment/Plan   Last Updated: 22-Apr-13 11:14 by Yolonda Kida (MD)

## 2014-09-05 NOTE — Consult Note (Signed)
PATIENT NAME:  Kaitlin [name], Kaitlin [name] MR#:  [id number] DATE OF BIRTH:  October 28, 1934  DATE OF CONSULTATION:  09/03/2011  REFERRING PHYSICIAN:  Theodoro Grist, MD   CONSULTING PHYSICIAN:  Otelia Limes. Yves Dill, MD  REASON FOR CONSULTATION: Hydronephrosis and ureterolithiasis.   HISTORY OF PRESENT ILLNESS: Ms. Roseman is a 80 year old Caucasian female who presented with SIRS, acute renal failure, ureterolithiasis and bladder stones. She underwent emergent nephrostomy tube placement on April 20th, and her overall condition has improved significantly. She was transferred from the Intensive Care Unit to the regular floor today. She is not able to give a medical history due to dementia, but history was obtained from her husband and the hospital records. There was no prior history of stone disease. She did have numerous stones in the left ureter as well as some stones in the bladder. She is not having any pain now.   ALLERGIES: Augmentin and tetracycline.   CHRONIC MEDICATIONS: Amlodipine, Aricept, aspirin, Claritin, Fosamax, Lescol XL, potassium chloride.   PAST SURGICAL HISTORY:  1. Sinus surgery. 2. Hysterectomy.   SOCIAL HISTORY: The patient denied tobacco or alcohol abuse.   FAMILY HISTORY: Noncontributory.   PAST AND CURRENT MEDICAL CONDITIONS:  1. Hypertension. 2. Dementia.  3. Hyperlipidemia.  4. Arthritis.  5. Allergic rhinitis.  6. Chronic sinusitis.   PHYSICAL EXAMINATION: Deferred.  IMPRESSION:  1. Urinary sepsis-systemic inflammatory response syndrome.  2. Left pyonephrosis due to multiple stones.  3. Bladder stones.   SUGGESTIONS:  1. Continue nephrostomy drainage until the patient has been stabilized and cleared  for general anesthesia. 2. Proceed with a Lasix renogram to determine what function is remaining in the left kidney. 3. Ultimately, the patient would benefit from lithotripsy of the bladder stones as well as the ureteral stones, but the function of the left kidney is in  question.   ____________________________ Otelia Limes. Yves Dill, MD [id number] D: 09/03/2011 13:02:45 ET T: 09/03/2011 13:15:39 ET JOB#: [id number]  cc: Otelia Limes. Yves Dill, MD, <Dictator> Royston Cowper MD ELECTRONICALLY SIGNED 09/04/2011 8:49

## 2014-09-05 NOTE — H&P (Signed)
PATIENT NAME:  Kaitlin [name], Kaitlin [name] MR#:  [id number] DATE OF BIRTH:  [birth year]  DATE OF ADMISSION:  08/31/2011  REFERRING PHYSICIAN: Pollie Friar, MD  CHIEF COMPLAINT: Weakness   HISTORY OF PRESENT ILLNESS: The patient is a 80 year old Caucasian female with history of dementia, hypertension, and hyperlipidemia who is here for ongoing weakness, nausea, vomiting, and diarrhea. The patient stated that the symptoms started around Monday and she has had multiple episodes of loose stools, about 2 to 3 a day. Stool is yellow. The patient also has nausea and has vomited three times. The last vomitus was yesterday afternoon. She has had decreased p.o. intake and has been mostly in bed. She has some body aches. She has no shortness of breath or chest pain. She has no fever, although she feels hot and cold. The patient is a poor historian and constantly looks for answers towards her husband who is in the room. On arrival she was found to have acute renal failure with creatinine of 2.3 with elevated LFTs and troponin of 0.2. She denies having any chest pain, orthopnea, or abdominal pain. Hospitalist services were contacted for further evaluation and management.   PAST MEDICAL HISTORY:  1. Hypertension. 2. Dementia. 3. Hyperlipidemia. 4. Arthritis.  5. Seasonal allergies. 6. Chronic sinus issues.  MEDICATIONS: 1. Amlodipine 5 mg daily.  2. Aricept 10 mg daily.  3. Aspirin 81 mg daily. 4. Black cohosh root one cap daily. 5. Claritin 10 mg daily.  6. Fosamax 70 mg once a week. 7. Lescol XL 80 mg extended-release daily.  8. Potassium chloride 10 mEq extended-release daily.  9. Tart cherry 425 mg daily.   ALLERGIES: Augmentin, dust, and tetracycline.   PAST SURGICAL HISTORY:  1. Multiple sinus surgeries. 2. Hysterectomy.   SOCIAL HISTORY: She lives with her husband. No tobacco, alcohol, or drug use. The patient is a distant smoker, quit multiple years ago.   FAMILY HISTORY: No history of  hypertension.   REVIEW OF SYSTEMS: Review of systems is limited given dementia. CONSTITUTIONAL: The patient feels weak overall. No weight changes. There is some fatigue. Some blurry vision and dizziness when standing up. ENT: Had some sinus surgery in the past.  RESPIRATORY: No cough, wheezing, or shortness of breath. CARDIOVASCULAR: No chest pain, orthopnea, or edema. GASTROINTESTINAL: As above. No blood in the stool or dark stools. GENITOURINARY: Decreased urine frequency. No pain. HEME/LYMPH: No anemia or easy bruising. SKIN: Denies any  new rashes. MUSCULOSKELETAL: Has chronic arthritis and right shoulder pain. NEUROLOGIC: Has dementia. Overall weak without focal weakness. PSYCH: Has no anxiety or insomnia.   PHYSICAL EXAMINATION:   VITAL SIGNS: Temperature on arrival 96.5 and last temperature 97.2, pulse on arrival 96 and currently 84, blood pressure 112/61, 96% oxygen saturation on room air, and respiratory rate is 20.   GENERAL: Well developed, elderly Caucasian female lying in bed in no obvious distress, talking in full sentences.   HEENT: Normocephalic, atraumatic. Pupils are equal and reactive. Anicteric sclerae. Dry mucous membranes.   NECK: Supple. No thyroid tenderness.  CARDIOVASCULAR: S1 and S2 with no murmurs, rubs, or gallops.   LUNGS: Clear to auscultation without any wheezing. No costophrenic angle tenderness on either side.   ABDOMEN: Soft. Hyperactive bowel sounds. No tenderness, rebound, or guarding.   EXTREMITIES: No significant lower extremity edema.   NEUROLOGICAL: Cranial nerves II through XII appear to be grossly intact. Strength 5/5 in all extremities.   PSYCHIATRIC: The patient is awake, alert, and oriented to person and place, not  to time, pleasant, conversant.   LABS/STUDIES: Blood glucose on BMP is 97, BUN 42, creatinine 2.33, sodium 138, potassium 4.1, and CO2 19. Lipase 363. Troponins 0.2. CK-MB is 2.6 and CK total 673. Alkaline phosphatase 175, AST 124,  ALT 105, albumin 2.7, and bilirubin 1. WBC is 14.6, hemoglobin 12.6, hematocrit 38.6, platelets 198, and neutrophil count is up to 12.7. Rapid flu is  negative.   X-ray of the chest, portable: No acute changes.   EKG: Rate is 88, normal sinus rhythm, T wave inversions and Q waves in lead III and some poor QRS progression. No acute ST elevations or depressions.   ASSESSMENT AND PLAN: We have a 80 year old Caucasian female with history of dementia, hypertension, hyperlipidemia, and chronic arthritis who has felt unwell for four days presenting with nausea, vomiting, and diarrhea, likely gastroenteritis, renal failure, and elevated troponins. At this point, would admit the patient to telemetry. The patient has had some loose stools and nausea and vomiting. She has had some body aches and chills and feels hot and cold. The patient possibly has viral gastroenteritis. At this point, I would check stool cultures however as well as rule out Clostridium difficile given she has some mild leukocytosis. We will start the patient on D5 one half normal saline given she is not eating much and start the patient on Zofran. Would consider a CT of the abdomen and pelvis if symptoms persist. The patient has a lipase which is within normal limits.  She does have elevated troponin which likely is in the setting of GI losses and dehydration and poor renal clearance given the renal failure. She has no chest pain and no acute ST elevations or depressions. We would check lipids, cycle troponins, and resume the aspirin, although we would hold the statin given the elevated LFTs. Would also check an echocardiogram. In regards to elevated LFTs, we would check an abdominal ultrasound, monitor and hold the statin. We would recheck them in the morning. I would hold amlodipine given the blood pressure being on the lower side and continue IV fluids. The patient does have SIRS criteria with heart rate greater than 90 on arrival with WBC greater  than 12. She has no fever here. We would panculture her, including stool, blood, and urine. She has an x-ray of the chest which is not suggestive for pneumonia. I would not start antibiotics currently as there is no obvious source for now. We would followup with the cultures and start antibiotics if anything shows up on the cultures. I would resume the Aricept and the Claritin. I will start the patient on heparin for deep vein thrombosis prophylaxis.  CODE STATUS: FULL CODE.   TOTAL TIME SPENT: 55 minutes. ____________________________ Vivien Presto, MD [id number] D: 08/31/2011 16:33:27 ET T: 08/31/2011 17:49:34 ET JOB#: [id number]  cc: Vivien Presto, MD, <Dictator> Vivien Presto MD ELECTRONICALLY SIGNED 09/09/2011 19:29

## 2014-09-08 NOTE — H&P (Signed)
PATIENT NAME:  Kaitlin [name], Kaitlin [name] MR#:  [id number] DATE OF BIRTH:  Oct 20, 1934  DATE OF ADMISSION:  05/15/2014    CHIEF COMPLAINT: Right hip pain, right knee pain.   HISTORY OF PRESENT ILLNESS: This is a 80 year old female, who went to the walk-in clinic the other day secondary to right knee pain and right hip pain, was given tramadol and etodolac.  This did not help.  She complains of pain 10/10 in intensity in the right hip and then also pain in the right knee and low back. She has not been walking much.  Yesterday, she slipped out of the bed onto the floor last night and had no strength to get up. She was brought into the ER for further evaluation. In the ER, she was found to have a urinary tract infection and a CPK that was slightly elevated at 893. She was also found to have an elevated white count and hospitalist services were contacted for further evaluation.   PAST MEDICAL HISTORY: Hypertension, Alzheimer's, left knee pain, osteoporosis, hypothyroidism, hyperlipidemia.   PAST SURGICAL HISTORY: Kidney stones, sinus surgeries numerous, hysterectomy.   ALLERGIES: AUGMENTIN, TETRACYCLINE AND DUST   MEDICATIONS: Include amlodipine 5 mg daily, Aricept 10 mg daily, aspirin 81 mg daily, black cohosh root 1 capsule at bedtime, Claritin 10 mg daily, Fosamax 70 mg once a week on Sunday, garlic 6226 mg daily, Lescol XL 80 mg in the morning, potassium chloride 10 mEq in the morning.   SOCIAL HISTORY: Quit smoking 20 years ago. No alcohol. No drug use. 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   FAMILY HISTORY: Father died at 61 of a myocardial infarction. Mother died at 57 of a brain aneurysm.   REVIEW OF SYSTEMS: CONSTITUTIONAL: Positive for chills. No fever or sweats. Positive for weakness. total body. EYES: Does wear glasses.  EARS, NOSE, MOUTH AND THROAT:  Positive for runny nose. Positive for postnasal drip.  CARDIOVASCULAR: Positive for chest pain last Monday. No palpitations.   RESPIRATORY: No shortness of breath. Positive for cough, yellow phlegm.  GASTROINTESTINAL: Positive for constipation. No nausea. No vomiting. No abdominal pain. No bright red blood per rectum. No melena.  GENITOURINARY: Positive for burning on urination or hematuria.  MUSCULOSKELETAL: Positive for right hip pain, right knee pain, left hip pain.  INTEGUMENT: No rashes or eruptions.  NEUROLOGIC: No fainting or blackouts.  PSYCHIATRIC: No anxiety or depression.  ENDOCRINE: Positive for hypothyroidism.  HEMATOLOGIC AND LYMPHATIC: No anemia. No easy bruising or bleeding.   PHYSICAL EXAMINATION:  VITAL SIGNS: Included a temperature of 97.8, pulse 61, respirations 18, blood pressure 150/91, pulse oximetry 99% on room air.  GENERAL: No respiratory distress.  EYES: Conjunctivae and lids normal. Pupils equal, round, and reactive to light. Extraocular muscles intact. No nystagmus.  EARS, NOSE, MOUTH AND THROAT: Tympanic membranes, no erythema. Nasal mucosa: No erythema. Throat: No erythema. No exudate seen. Lips and gums: No lesions.  NECK: No JVD. No bruits. No lymphadenopathy. No thyromegaly. No thyroid nodules palpated.  RESPIRATORY:  Lungs clear to auscultation. No use of accessory muscles to breathe. No rhonchi, rales, or wheeze heard.  CARDIOVASCULAR: S1, S2 normal. No gallops, rubs, or murmurs heard. Carotid upstroke 2+ bilaterally. No bruits. Dorsalis pedis pulses 2+ bilaterally, 1+ edema bilateral lower extremity.  ABDOMEN: Soft, nontender. No organosplenomegaly. Normoactive bowel sounds. No masses felt.  LYMPHATIC: No lymph nodes in the neck.  MUSCULOSKELETAL: No clubbing, 1+ edema. No cyanosis.  With passive range of motion, with flexion  and internal rotation, the patient complained of severe pain on the right hip.  In moving around her knee, I did hear cracking when moving around, so I am assuming this is arthritis.  The patient did have pain to palpation over the right sacroiliac area.  None  over the lumbar spine.   SKIN: No ulcers or lesions seen.  NEUROLOGICAL: Cranial nerves II through XII grossly intact. Deep tendon reflexes 1+ bilateral lower extremity. The patient is barely able to straight leg raise with the right leg and sensation grossly intact to light touch.  PSYCHIATRIC: The patient is alert and oriented to person and place.     LABORATORY DATA: EKG:  Sinus bradycardia at 55 beats per minute, right hip x-ray negative. Troponin negative. Urinalysis 2+ leukocyte esterase, 3+ bacterial. Glucose 102, BUN 24, creatinine 0.75, sodium 140, potassium 3.8, chloride 104, CO2 of 28, calcium 9.8, total bilirubin 1.2, alkaline phosphatase 99, ALT 54, AST 68, CPK 893. White blood cell count 14.2, hemoglobin and hematocrit 13.5 and 40.9, platelet count of 269,000.   ASSESSMENT AND PLAN:  1.  Right knee pain and right hip pain with right sacroiliac pain. Will get x-rays of the right knee and pelvis, get orthopedic consultation and physical therapy consultation. I did give 1 dose of IV Solu-Medrol to see if this is inflammatory to see if that helps. This could be a bursitis of the right knee with sacroiliac inflammation and the right knee I think is more arthritis. We will admit as an observation for right now.  2.  Elevation of CPK could be a mild rhabdomyolysis. We will check another CPK in the a.m. hold the statin, and give vigorous IV fluid hydration.  3.  Cystitis with leukocytosis. We will give IV Rocephin and check a urine culture.  4.  Hypertension. Blood pressure borderline low, will continue to monitor.  5.  Alzheimer's disease without behavioral disturbance.  Continue current medication.  6.  Hyperlipidemia, unspecified, hold Lescol at this point.  7.  History of hypothyroidism, not on any medication.  I will check a TSH in the a.m.   TIME SPENT ON ADMISSION: 55 minutes.   CODE STATUS:  The patient is a FULL CODE.     ____________________________ Tana Conch. Leslye Peer,  MD rjw:DT D: 05/15/2014 14:26:21 ET T: 05/15/2014 14:44:34 ET JOB#: [id number]  cc: Tana Conch. Leslye Peer, MD, <Dictator> Marisue Brooklyn MD ELECTRONICALLY SIGNED 05/23/2014 15:11

## 2014-09-12 NOTE — Discharge Summary (Signed)
PATIENT NAME:  Kaitlin, [name] MR#:  [id number] DATE OF BIRTH:  [birth year]  DATE OF ADMISSION:  05/15/2014 DATE OF DISCHARGE:  05/18/2014  ADMITTING DIAGNOSES: 1. Right knee pain and hip pain with right sacroiliac pain.  2. Elevated CPK.  3. Acute cystitis with leukocytosis.  4. Hypertension.  5. Alzheimer disease.  6. Hyperlipidemia. 7. Hypothyroidism, not on any medications.   DISCHARGE DIAGNOSES: 1. Right knee pain and hip pain with right sacroiliac pain, probably from degenerative joint disease.  2. Elevated CPK with mild rhabdomyolysis back to normal with IV fluids.  3. Acute cystitis with Escherichia coli and Klebsiella.  4. Hypertension.  5. Alzheimer disease.  6. Hyperlipidemia. 7. Hypothyroidism, not on any medications, below normal TSH, but FT4 was normal. T3 is pending.   CONSULTATIONS: 1. Orthopedics.  2. Physical therapy.   PROCEDURES: None.   BRIEF HISTORY AND PHYSICAL AND HOSPITAL COURSE: The patient is a 80 year old female who came into the ED with right knee pain and right hip pain. The patient's pain was 10 out of 10 and with tramadol and etodolac, it did not improve. CPK was elevated at 893. Please review history and physical for details. The patient was admitted to the hospital. One dose of IV Solu-Medrol was given and IV fluids were given.   1. Right hip pain and knee pain with right sacroiliac joint x-rays did not reveal any fractures, probably from DJD as per x-ray results. Orthopedics consultation and physical therapy consult was placed. The patient was given IV Solu-Medrol and subsequently she was placed on prednisone to give benefit of the doubt for inflammatory conditions. . Orthopedics has recommended symptomatic treatment with pain medication,  Ace wrap, ice packs and outpatient follow-up with them. Physical therapy has evaluated the patient and recommended home PT.  2. Elevation of CPK probably secondary to mild rhabdomyolysis. Statin was held during the  hospital course  . IV fluids were given and repeat CPK was trending toward normal.  3. Acute cystitis with leukocytosis. Urine cultures were ordered. The patient was given IV Rocephin. Culture has revealed Escherichia coli and Klebsiella. IV Rocephin was changed to p.o. ciprofloxacin at the time of discharge.  4. Hypertension. Blood pressure is borderline low.  5. Hyperlipidemia. Lescol was held in view of rhabdomyolysis. 6. Hypothyroidism. TSH was low, but FT4 was normal. Free T3 is pending, which needs to be followed up by the primary care physician.   The hospital course was uneventful. The decision was made to discharge the patient home with home health.  CODE STATUS: Full Code.   PHYSICAL EXAMINATION:  VITAL SIGNS: On January 5, on the day of discharge, temperature 97.6, pulse 77, respirations 18, blood pressure 135/85, pulse oximetry 92% at rest.  GENERAL APPEARANCE: Not in any acute distress. Moderately built and nourished.  HEENT: Normocephalic, atraumatic. Pupils are equally reacting to light and accommodation. No scleral icterus. No postnasal drip. Moist mucous membranes.  NECK: Supple. No JVD. No thyromegaly. Range of motion is intact.  LUNGS: Clear to auscultation. No anterior chest wall tenderness  CARDIAC: S1, S2 normal. Regular rate and rhythm. No murmurs.  GASTROINTESTINAL: Soft. Bowel sounds are positive in all 4 quadrants. Nontender, nondistended. No hepatosplenomegaly. No masses felt.  NEUROLOGIC: Awake, alert, and oriented x 3. Cranial nerves II through XII are grossly intact. Motor and sensory intact. Reflexes are 2+.  EXTREMITIES: Complaining of tenderness, but ambulated with the help of physical therapy.  LABORATORIES AND IMAGING STUDIES: CPK total 893. Repeat CPK was 390.  CPK-MB 13.4. Troponin less than 0.02. TSH was below nml. FT4 of 1.31. T3 was ordered.   WBC 12.8, hemoglobin 12.9, hematocrit and platelets were normal.   Urine culture: Greater than 100,000 colonies  of Escherichia coli and greater than 100,000 colonies of Klebsiella pneumonia sensitive to ceftriaxone and ciprofloxacin.  FULL CODE.  MEDICATIONS AT THE TIME OF DISCHARGE: 1. Ciprofloxacin 500 mg p.o. q.12 h.  2. Oxycodone 5 mg 1 tablet p.o. every 6 hours as needed for moderate to severe pain. 3. Tylenol 325 mg 2 tablets p.o. every 4 hours as needed for mild pain.  4. Prednisone 50 mg 1 tablet p.o. once daily for 5 days. 5. Garlic 9417 mg 1 capsule p.o. once daily. 6. Black cohosh 1 capsule p.o. once daily.  7. Claritin 10 mg p.o. once daily.  8. Amlodipine 5 mg p.o. once daily in the a.m.  9. Fosamax 70 mg 1 tablet p.o. once a week on Sunday.  10. Aricept 10 mg 1 tablet p.o. once daily.  11. Potassium chloride 10 mEq p.o. once daily.  12. Aspirin 81 mg 1 tablet p.o. once daily.  13. Lescol 80 mg extended release was resumed.   Home health was arranged. The patient is to get a physical therapy nurse and nurse's aide.   DIET: Regular diet.   ACTIVITY: As tolerated, as recommended by physical therapy.   FOLLOWUP INSTRUCTIONS: Follow up with primary care physician in 1 to 2 weeks.   The plan of care was discussed in detail with the patient. She verbalized understanding of the plan.   TOTAL TIME SPENT ON DISCHARGE AND COORDINATION OF CARE: 45 minutes.   ____________________________ Nicholes Mango, MD ag:JT D: 05/22/2014 20:17:00 ET T: 05/23/2014 08:15:49 ET JOB#: [id number]  cc: Unknown cc Nicholes Mango, MD, <Dictator>    Nicholes Mango MD ELECTRONICALLY SIGNED 05/24/2014 22:52

## 2014-09-12 NOTE — Consult Note (Signed)
PATIENT NAME:  Kaitlin [name], Kaitlin [name] MR#:  [id number] DATE OF BIRTH:  1934/07/03  DATE OF CONSULTATION:  05/16/2014  CONSULTING PHYSICIAN:  Maebelle Munroe, MD  CHIEF COMPLAINT: Right buttock pain.   HISTORY OF PRESENT ILLNESS: Kaitlin [name] is a 80 year old female who was admitted for leukocytosis and right posterior hip pain yesterday.  I was consulted by Dr. Leslye Peer for right posterior hip pain and right knee pain. The patient did have a fall out of bed on Friday, 05/14/2014.  The patient denies any history of neoplasm.   The patient's pain has improved substantially since come into the hospital. She reports  occasional sharp pain in the mid right buttock. No lateral hip pain. No groin pain. Patient was able to ambulate with physical therapy today including stair walking.  The patient has not had any fevers or chills or shakes. She does note that a UTI has been diagnosed since her admission.   PAST MEDICAL HISTORY: Remarkable for hypertension, Alzheimer disease, osteoporosis, hypothyroidism, hyperlipidemia.   PAST SURGICAL HISTORY: Kidney stone surgery, sinus surgery, and hysterectomy.   CURRENT ALLERGIES: AUGMENTIN, TETRACYCLINE AND DUST.  CURRENT MEDICATIONS:  Include amlodipine, Aricept, aspirin, supplements, Claritin, Fosamax, garlic, Lescol, and potassium chloride.   SOCIAL HISTORY: Nonsmoker, nondrinker, no drug use. Retired, married.   FAMILY HISTORY: Positive for coronary artery disease and brain aneurysm.   PHYSICAL EXAMINATION: GENERAL: A pleasant, alert, elderly female appearing stated age.  PSYCHIATRIC: Mood and affect appropriate.  VITAL SIGNS: On presentation to the ward, temperature 98.7, pulse of 81, respirations of 19, blood pressure 138/78,  95% on room air pulse oximetry. RIGHT KNEE:  Evaluation demonstrates active range of motion, stable anterior and posterior drawer. Range of motion is 5-135 degrees.  Moderate distress to palpation over the medial and lateral joint line. No  effusion. Marked patellofemoral crepitus with palpation. Stable varus ad valgus stress at zero and 30 degrees, stable Lachman. No deformity.  RIGHT HIP:  Examination reveals painless range of motion of the hip, including internal and external rotation. Patrick's test is negative for pain in the posterior hip and SI joint. There is no groin pain. Straight leg raise, she is able to hold. There is a negative Stinchfield test. There is no tenderness to palpation in the groin or over the greater trochanter. There is mild tenderness to palpation over the abductor region. There is focal tenderness to palpation over the SI joint in the mid buttock. No masses palpated.  LUMBAR SPINE: Examination reveals no lumbar spine tenderness to palpation. Straight leg raise is negative. Motor and light touch sensation examination are intact in bilateral lower extremities including 5/5 motor strength bilateral lower extremities. Negative clonus bilaterally.   IMAGING:  Review of radiographs including AP inlet and outlet pelvis reveal no acute fracture and mild degenerative changes of the right hip without acute fracture. There is mild to moderate sclerosis of bilateral SI joints with well-preserved SI joints. There is significant lumbar spondylosis noted, especially on the right side. Right hip radiographs revealed no fracture or dislocation. There are mild degenerative changes consistent mild DJD, as well as cam-type femoral acetabular impingement as well. Right knee radiographs demonstrate no acute fracture or dislocation. There are moderate degenerative changes tricompartmentally especially in the medial compartment and the patellofemoral joint, and also chondrocalcinosis consistent with CPPD or pseudogout in the knee.   IMPRESSION: Exacerbation of lumbar spondylosis, as well as mild sacroiliac joint, spondylosis. There is a mild right hip degenerative joint disease, which I do not  think is contributing to her symptoms. There is  also moderate right knee degenerative joint disease secondary to pseudogout which I think is causing her some symptoms.   PLAN: Continue supportive care. No acute intervention or further workup indicated. Please consult [place] if there are further questions.    ____________________________ Maebelle Munroe, MD jfs:LT D: 05/16/2014 18:54:00 ET T: 05/16/2014 19:27:51 ET JOB#: [id number]  cc: Maebelle Munroe, MD, <Dictator> Maebelle Munroe MD ELECTRONICALLY SIGNED 06/02/2014 23:54

## 2014-09-12 NOTE — Consult Note (Signed)
Brief Consult Note: Diagnosis: lumbar spondylosis exacerbation, mild R hip DJD, moderate R knee DJD/CPPD, r SI jt spondylosis.   Patient was seen by consultant.   Consult note dictated.   Comments: 1. no acute fx identified 2. continue supportive care 3. signing off, call Dr Jonny Ruiz if further questions.  Electronic Signatures: Maebelle Munroe (MD)  (Signed 03-Jan-16 18:49)  Authored: Brief Consult Note   Last Updated: 03-Jan-16 18:49 by Maebelle Munroe (MD)

## 2017-01-02 DIAGNOSIS — E785 Hyperlipidemia, unspecified: Secondary | ICD-10-CM | POA: Insufficient documentation

## 2017-01-02 DIAGNOSIS — I340 Nonrheumatic mitral (valve) insufficiency: Secondary | ICD-10-CM | POA: Insufficient documentation

## 2017-07-08 ENCOUNTER — Encounter: Payer: Self-pay | Admitting: Urology

## 2017-07-08 ENCOUNTER — Ambulatory Visit (INDEPENDENT_AMBULATORY_CARE_PROVIDER_SITE_OTHER): Payer: Medicare Other | Admitting: Urology

## 2017-07-08 VITALS — BP 128/82 | HR 82 | Resp 13 | Ht 60.0 in | Wt 178.0 lb

## 2017-07-08 DIAGNOSIS — N390 Urinary tract infection, site not specified: Secondary | ICD-10-CM | POA: Diagnosis not present

## 2017-07-08 DIAGNOSIS — N200 Calculus of kidney: Secondary | ICD-10-CM

## 2017-07-08 DIAGNOSIS — N3941 Urge incontinence: Secondary | ICD-10-CM

## 2017-07-08 DIAGNOSIS — N952 Postmenopausal atrophic vaginitis: Secondary | ICD-10-CM

## 2017-07-08 LAB — URINALYSIS, COMPLETE
BILIRUBIN UA: NEGATIVE
Glucose, UA: NEGATIVE
KETONES UA: NEGATIVE
Nitrite, UA: NEGATIVE
PROTEIN UA: NEGATIVE
RBC UA: NEGATIVE
SPEC GRAV UA: 1.015 (ref 1.005–1.030)
Urobilinogen, Ur: 0.2 mg/dL (ref 0.2–1.0)
pH, UA: 6 (ref 5.0–7.5)

## 2017-07-08 LAB — MICROSCOPIC EXAMINATION

## 2017-07-08 LAB — BLADDER SCAN AMB NON-IMAGING: SCAN RESULT: 0

## 2017-07-08 NOTE — Progress Notes (Signed)
07/08/2017 3:05 PM   Kaitlin [name] 25-Jul-1934 [id number]  Referring provider: Ellamae Sia, MD [address] [place], [place] [address]  Chief Complaint  Patient presents with  . Urinary Frequency    HPI: Patient is a 83 -year-old Caucasian female who is referred to [place] by, Dr. Smith Mince, for recurrent urinary tract infections with her husband, Buddy.   She is a difficult historian due to her Alzheimer's.    She states that she did not know that she had two UTI's.  Reviewing the records, on 02/07/2017, she was seen by nephrology and was noted to have [facility] and TNTC RBC's in her UA.  Urine culture was positive for E. Coli.  She was placed on Cipro.  She then had a recheck of her urine and UA was positive for 13 RBC's and 87 WBC's.  Urine culture was positive for Cirtobacter koseri on 06/18/2017.    Her husband and her denies dysuria, gross hematuria, suprapubic pain, back pain, abdominal pain or flank pain.  They also denied recent fevers, chills, nausea or vomiting.   Her UA today is contains 11-30 WBC's and moderate bacteria.   Her PVR is 0 mL.    She does have a history of nephrolithiasis.  She has underwent URS and possibility PCNL or nephrostomy tube placement in the 90's.  She states that she has had her bladder tacked up over ten years ago.    She is not sexually active.  She is postmenopausal.  She had been on vaginal estrogen cream in the past but she was taken off the medication for unknown reasons.    She admits to constipation.  She does engage in good perineal hygiene. She does not take tub baths.   She does have incontinence.  She is using incontinence pads daily.  She has had incontinence for over one year.    She is not having pain with bladder filling.    RUS performed in 02/2017 at [facility] noted bilaterally echogenic kidneys which can be seen in setting of medical renal disease.   Nonobstructing 0.5 cm calculus in the right lower pole.   She has been  diagnosed with primary hyperparathyroidism.  She does not meet surgical criteria at this time.    She is drinking two 32 ounces of water daily.   Some coffee in the winter.     PMH: Past Medical History:  Diagnosis Date  . Cystitis   . Diffuse cystic mastopathy   . Gout   . Hypertension     Surgical History: Past Surgical History:  Procedure Laterality Date  . ABDOMINAL HYSTERECTOMY  1980  . BLADDER STONE REMOVAL  2008  . BREAST LUMPECTOMY  1974  . COLONOSCOPY  1994  . LAPAROSCOPY  2013   gallstone removal  . NASAL SINUS SURGERY    . OOPHORECTOMY Bilateral     Home Medications:  Allergies as of 07/08/2017      Reactions   Augmentin [amoxicillin-pot Clavulanate] Nausea And Vomiting, Rash   Tetracyclines & Related Rash      Medication List        Accurate as of 07/08/17  3:05 PM. Always use your most recent med list.          alendronate 40 MG tablet Commonly known as:  FOSAMAX Take 40 mg by mouth every 7 (seven) days. Take with a full glass of water on an empty stomach.   aspirin 81 MG tablet Take 81 mg  by mouth daily.   cholecalciferol 400 UNIT/ML Liqd Commonly known as:  D-VI-SOL Take 400 Units by mouth daily.   conjugated estrogens vaginal cream Commonly known as:  PREMARIN Place 0.5 g vaginally daily.   donepezil 10 MG tablet Commonly known as:  ARICEPT Take 10 mg by mouth at bedtime as needed.   fluticasone 50 MCG/BLIST diskus inhaler Commonly known as:  FLOVENT DISKUS Inhale 1 puff into the lungs 2 (two) times daily.   loratadine 10 MG tablet Commonly known as:  CLARITIN Take 10 mg by mouth daily.   potassium chloride 10 MEQ tablet Commonly known as:  K-DUR Take 10 mEq by mouth 2 (two) times daily.   vitamin E 400 UNIT capsule Generic drug:  vitamin E Take 400 Units by mouth daily.       Allergies:  Allergies  Allergen Reactions  . Augmentin [Amoxicillin-Pot Clavulanate] Nausea And Vomiting and Rash  . Tetracyclines & Related Rash      Family History: History reviewed. No pertinent family history.  Social History:  reports that she has quit smoking. Her smoking use included cigarettes. She smoked 0.50 packs per day. she has never used smokeless tobacco. She reports that she does not drink alcohol or use drugs.  ROS: UROLOGY Frequent Urination?: No Hard to postpone urination?: No Burning/pain with urination?: No Get up at night to urinate?: No Leakage of urine?: Yes Urine stream starts and stops?: No Trouble starting stream?: No Do you have to strain to urinate?: No Blood in urine?: No Urinary tract infection?: No Sexually transmitted disease?: No Injury to kidneys or bladder?: Yes Painful intercourse?: No Weak stream?: No Currently pregnant?: No Vaginal bleeding?: No Last menstrual period?: n  Gastrointestinal Nausea?: No Vomiting?: No Indigestion/heartburn?: No Diarrhea?: No Constipation?: No  Constitutional Fever: No Night sweats?: No Weight loss?: No Fatigue?: No  Skin Skin rash/lesions?: No Itching?: No  Eyes Blurred vision?: No Double vision?: No  Ears/Nose/Throat Sore throat?: No Sinus problems?: No  Hematologic/Lymphatic Swollen glands?: No Easy bruising?: No  Cardiovascular Leg swelling?: No Chest pain?: No  Respiratory Cough?: No Shortness of breath?: No  Endocrine Excessive thirst?: No  Musculoskeletal Back pain?: No Joint pain?: Yes  Neurological Headaches?: No Dizziness?: No  Psychologic Depression?: No Anxiety?: No  Physical Exam: BP 128/82   Pulse 82   Resp 13   Ht 5' (1.524 m)   Wt 178 lb (80.7 kg)   BMI 34.76 kg/m   Constitutional: Well nourished. Alert and oriented, No acute distress. HEENT: [place] AT, moist mucus membranes. Trachea midline, no masses. Cardiovascular: No clubbing, cyanosis, or edema. Respiratory: Normal respiratory effort, no increased work of breathing. GI: Abdomen is soft, non tender, non distended, no abdominal masses.  Liver and spleen not palpable.  No hernias appreciated.  Stool sample for occult testing is not indicated.   GU: No CVA tenderness.  No bladder fullness or masses.  Atrophic external genitalia, normal pubic hair distribution, no lesions.  Normal urethral meatus, no lesions, no prolapse, no discharge.   No urethral masses, tenderness and/or tenderness. No bladder fullness, tenderness or masses. Normal vagina mucosa, good estrogen effect, no discharge, no lesions, good pelvic support, no cystocele or rectocele noted.  Cervix, uterus and adnexa are surgically absent.   Anus and perineum are without rashes or lesions.    Skin: No rashes, bruises or suspicious lesions. Lymph: No cervical or inguinal adenopathy. Neurologic: Grossly intact, no focal deficits, moving all 4 extremities. Psychiatric: Normal mood and affect.  Laboratory  Data: Lab Results  Component Value Date   WBC 12.8 (H) 05/16/2014   HGB 12.9 05/16/2014   HCT 40.7 05/16/2014   MCV 88 05/16/2014   PLT 267 05/16/2014    Lab Results  Component Value Date   CREATININE 0.66 05/16/2014    No results found for: PSA  No results found for: TESTOSTERONE  Lab Results  Component Value Date   HGBA1C 5.7 09/01/2011    Lab Results  Component Value Date   TSH 0.325 (L) 05/16/2014       Component Value Date/Time   CHOL 94 09/01/2011 0340   HDL 4 (L) 09/01/2011 0340   VLDL 33 09/01/2011 0340   LDLCALC 57 09/01/2011 0340    Lab Results  Component Value Date   AST 68 (H) 05/15/2014   Lab Results  Component Value Date   ALT 54 05/15/2014   No components found for: ALKALINEPHOPHATASE No components found for: BILIRUBINTOTAL  No results found for: ESTRADIOL  Urinalysis    Component Value Date/Time   COLORURINE Yellow 05/15/2014 1034   APPEARANCEUR Cloudy 05/15/2014 1034   LABSPEC 1.017 05/15/2014 1034   PHURINE 6.0 05/15/2014 1034   GLUCOSEU Negative 05/15/2014 1034   HGBUR 3+ 05/15/2014 1034   BILIRUBINUR Negative  05/15/2014 1034   KETONESUR 1+ 05/15/2014 1034   PROTEINUR 30 mg/dL 05/15/2014 1034   NITRITE Negative 05/15/2014 1034   LEUKOCYTESUR 2+ 05/15/2014 1034    I have reviewed the labs.   Pertinent Imaging: --Bilaterally echogenic kidneys which can be seen in setting of medical renal disease.  -- Nonobstructing 0.5 cm calculus in the right lower pole.    Please see below for data measurements:  Right kidney: 10.9 cm Left kidney:9.3 cm Bladder volume: 14.28m   Result Narrative  EXAM: [place]IVC[place] DATE: 02/14/2017 10:05 AM ACCESSION: [id number] DICTATED: 02/14/2017 10:15 AM INTERPRETATION LOCATION: [place] CLINICAL INDICATION: 83years old Female with evaluate size and echogenicity of kidneys-N18.3-CKD (chronic kidney disease) stage 3, GFR 30-59 ml/min (CMS-HCC)  COMPARISON: None.  TECHNIQUE: Static and cine images of the kidneys, bladder and retroperitoneum were performed.  FINDINGS:  KIDNEYS: Echogenic kidneys bilaterally. No solid masses. Right lower pole nonobstructing 0.5 cm calculus. No left renal calculi. No hydronephrosis.  BLADDER: Decompressed, limiting evaluation.  VESSELS: The visualized portions of the abdominal aorta were unremarkable. The IVC was not well visualized due to overlying bowel gas.  Other Result Information  Interface, Rad Results In - 02/14/2017 10:58 AM EDT EXAM: [place]AO/IVC[place] DATE: 02/14/2017 10:05 AM ACCESSION: [id number] DICTATED: 02/14/2017 10:15 AM INTERPRETATION LOCATION: [place] CLINICAL INDICATION: 83years old Female with evaluate size and echogenicity of kidneys-N18.3-CKD (chronic kidney disease) stage 3, GFR 30-59 ml/min (CMS-HCC)    COMPARISON: None.  TECHNIQUE: Static and cine images of the kidneys, bladder and retroperitoneum were performed.  FINDINGS:  KIDNEYS: Echogenic kidneys bilaterally. No solid masses. Right lower pole nonobstructing 0.5 cm calculus.  No left renal calculi. No hydronephrosis.  BLADDER: Decompressed, limiting evaluation.  VESSELS: The visualized portions of the abdominal aorta were unremarkable. The IVC was not well visualized due to overlying bowel gas.   IMPRESSION:  --Bilaterally echogenic kidneys which can be seen in setting of medical renal disease.  -- Nonobstructing 0.5 cm calculus in the right lower pole.    Please see below for data measurements:  Right kidney: 10.9 cm Left kidney:  9.3 cm Bladder volume: 14.2  mL     Assessment & Plan:  1. Recurrent UTI's  - criteria for recurrent UTI has been met with 2 or more infections in 6 months or 3 or greater infections in one year   - Patient to continue their water intake until the urine is pale yellow or clear (10 to 12 cups daily)   - She is advised to take probiotics (yogurt, oral pills or vaginal suppositories), take cranberry pills or drink the juice and Vitamin C 1,000 mg daily to acidify the urine should be added to their daily regimen   - avoid soaking in tubs and wipe front to back after urinating   - discussed the phenomena of colonization and asked her to only have her urine cultured for symptoms, gross hematuria, dysuria, fevers/chills, suprapubic or low back pain or mental status changes  - advised them to have CATH UA's for urinalysis and culture to prevent skin contamination of the specimen  - reviewed symptoms of UTI and advised not to have urine checked or be treated for UTI if not experiencing symptoms  - discussed antibiotic stewardship with the patient and husband                            - Urinalysis, Complete - will not send for culture at this time as she is asymptomatic - will obtain a CATH UA upon return  - Bladder Scan (Post Void Residual) in office  2. Vaginal atrophy  - she was taken off the vaginal cream for unknown reasons   3. Right lower renal pole stone  - non obstructing calculus in the right lower pole, 5 mm in  size  - no intervention warranted at this time   - would be difficult to perform a 24 hour urine metabolic work up due to patient's Alzheimer  - will obtain KUB on a yearly basis as she had primary hyperparathyroidism  - Advised to contact our office or seek treatment in the ED if becomes febrile or pain/ vomiting are difficult control in order to arrange for emergent/urgent intervention   4. Urge incontinence  - offered behavioral therapies, bladder training, bladder control strategies and pelvic floor muscle training - not a candidate due to Alzheimer's   - fluid management - good water intake  - offered medical therapy with beta-3 adrenergic receptor agonist and the potential side effects of therapy - would like to try the beta-3 adrenergic receptor agonist (Myrbetriq).  Given Myrbetriq 25 mg samples, #28.  I have reviewed with the patient of the side effects of Myrbetriq, such as: elevation in BP, urinary retention and/or HA.    - RTC in 3 weeks for PVR and symptom recheck   Return in about 3 weeks (around 07/29/2017) for PVR and OAB questionnaire.  These notes generated with voice recognition software. I apologize for typographical errors.  Zara Council, [facility] [address], [place], [place] [address] [phone]

## 2017-07-09 ENCOUNTER — Other Ambulatory Visit: Payer: Self-pay

## 2017-07-29 NOTE — Progress Notes (Signed)
07/30/2017 11:08 AM   Ernest Pine 29-Sep-1934 [id number]  Referring provider: Ellamae Sia, MD [address] [place], [place] [address]  No chief complaint on file.   HPI: Patient is an 83 year old Caucasian female with recurrent UTIs, vaginal atrophy, right renal stone and urgency who presents today for a 3-week follow-up after trial of Myrbetriq 25 mg daily  Background history Patient is a 49 -year-old Caucasian female who is referred to [place] by, Dr. Smith Mince, for recurrent urinary tract infections with her husband, Buddy.   She is a difficult historian due to her Alzheimer's.  She states that she did not know that she had two UTI's.  Reviewing the records, on 02/07/2017, she was seen by nephrology and was noted to have [facility] and TNTC RBC's in her UA.  Urine culture was positive for E. Coli.  She was placed on Cipro.  She then had a recheck of her urine and UA was positive for 13 RBC's and 87 WBC's.  Urine culture was positive for Cirtobacter koseri on 06/18/2017.  Her husband and her denies dysuria, gross hematuria, suprapubic pain, back pain, abdominal pain or flank pain.  They also denied recent fevers, chills, nausea or vomiting.  Her UA was positive 11-30 WBC's and moderate bacteria.   Her PVR is 0 mL.  She does have a history of nephrolithiasis.  She has underwent URS and possibility PCNL or nephrostomy tube placement in the 90's.  She states that she has had her bladder tacked up over ten years ago.   She is not sexually active.  She is postmenopausal.  She had been on vaginal estrogen cream in the past but she was taken off the medication for unknown reasons.   She admits to constipation.  She does engage in good perineal hygiene. She does not take tub baths.  She does have incontinence.  She is using incontinence pads daily.  She has had incontinence for over one year.   She is not having pain with bladder filling.   RUS performed in 02/2017 at [facility] noted bilaterally  echogenic kidneys which can be seen in setting of medical renal disease.   Nonobstructing 0.5 cm calculus in the right lower pole.  She has been diagnosed with primary hyperparathyroidism.  She does not meet surgical criteria at this time.  She is drinking two 32 ounces of water daily.   Some coffee in the winter.    Today, she states she is not leaking as much.   Her husband agrees.  Patient denies any gross hematuria, dysuria or suprapubic/flank pain.  Patient denies any fevers, chills, nausea or vomiting.  Her PVR is 0 mL.   Her BP is 133/88.      PMH: Past Medical History:  Diagnosis Date  . Cystitis   . Diffuse cystic mastopathy   . Gout   . Hypertension     Surgical History: Past Surgical History:  Procedure Laterality Date  . ABDOMINAL HYSTERECTOMY  1980  . BLADDER STONE REMOVAL  2008  . BREAST LUMPECTOMY  1974  . COLONOSCOPY  1994  . LAPAROSCOPY  2013   gallstone removal  . NASAL SINUS SURGERY    . OOPHORECTOMY Bilateral     Home Medications:  Allergies as of 07/30/2017      Reactions   Augmentin [amoxicillin-pot Clavulanate] Nausea And Vomiting, Rash   Tetracyclines & Related Rash      Medication List        Accurate  as of 07/30/17 11:08 AM. Always use your most recent med list.          alendronate 40 MG tablet Commonly known as:  FOSAMAX Take 40 mg by mouth every 7 (seven) days. Take with a full glass of water on an empty stomach.   aspirin 81 MG tablet Take 81 mg by mouth daily.   cholecalciferol 400 UNIT/ML Liqd Commonly known as:  D-VI-SOL Take 400 Units by mouth daily.   conjugated estrogens vaginal cream Commonly known as:  PREMARIN Place 0.5 g vaginally daily.   donepezil 10 MG tablet Commonly known as:  ARICEPT Take 10 mg by mouth at bedtime as needed.   fluticasone 50 MCG/BLIST diskus inhaler Commonly known as:  FLOVENT DISKUS Inhale 1 puff into the lungs 2 (two) times daily.   loratadine 10 MG tablet Commonly known as:  CLARITIN Take  10 mg by mouth daily.   mirabegron ER 25 MG Tb24 tablet Commonly known as:  MYRBETRIQ Take 1 tablet (25 mg total) by mouth daily.   potassium chloride 10 MEQ tablet Commonly known as:  K-DUR Take 10 mEq by mouth 2 (two) times daily.   vitamin E 400 UNIT capsule Generic drug:  vitamin E Take 400 Units by mouth daily.       Allergies:  Allergies  Allergen Reactions  . Augmentin [Amoxicillin-Pot Clavulanate] Nausea And Vomiting and Rash  . Tetracyclines & Related Rash    Family History: No family history on file.  Social History:  reports that she has quit smoking. Her smoking use included cigarettes. She smoked 0.50 packs per day. she has never used smokeless tobacco. She reports that she does not drink alcohol or use drugs.  ROS: UROLOGY Frequent Urination?: No Hard to postpone urination?: No Burning/pain with urination?: No Get up at night to urinate?: No Leakage of urine?: No Urine stream starts and stops?: No Trouble starting stream?: No Do you have to strain to urinate?: No Blood in urine?: No Urinary tract infection?: No Sexually transmitted disease?: No Injury to kidneys or bladder?: No Painful intercourse?: No Weak stream?: No Currently pregnant?: No Vaginal bleeding?: No Last menstrual period?: n  Gastrointestinal Nausea?: No Vomiting?: No Indigestion/heartburn?: No Diarrhea?: No Constipation?: No  Constitutional Fever: No Night sweats?: No Weight loss?: No Fatigue?: No  Skin Skin rash/lesions?: No Itching?: No  Eyes Blurred vision?: No Double vision?: No  Ears/Nose/Throat Sore throat?: No Sinus problems?: No  Hematologic/Lymphatic Swollen glands?: No Easy bruising?: No  Cardiovascular Leg swelling?: No Chest pain?: No  Respiratory Cough?: No Shortness of breath?: No  Endocrine Excessive thirst?: No  Musculoskeletal Back pain?: No Joint pain?: No  Neurological Headaches?: No Dizziness?:  No  Psychologic Depression?: No Anxiety?: No  Physical Exam: BP 133/88   Pulse 67   Resp 16   Wt 186 lb 11.2 oz (84.7 kg)   SpO2 98%   BMI 36.46 kg/m   Constitutional: Well nourished. Alert and oriented, No acute distress. HEENT: [place] AT, moist mucus membranes. Trachea midline, no masses. Cardiovascular: No clubbing, cyanosis, or edema. Respiratory: Normal respiratory effort, no increased work of breathing. Skin: No rashes, bruises or suspicious lesions. Lymph: No cervical or inguinal adenopathy. Neurologic: Grossly intact, no focal deficits, moving all 4 extremities. Psychiatric: Normal mood and affect.  Laboratory Data: Lab Results  Component Value Date   WBC 12.8 (H) 05/16/2014   HGB 12.9 05/16/2014   HCT 40.7 05/16/2014   MCV 88 05/16/2014   PLT 267 05/16/2014  Lab Results  Component Value Date   CREATININE 0.66 05/16/2014    No results found for: PSA  No results found for: TESTOSTERONE  Lab Results  Component Value Date   HGBA1C 5.7 09/01/2011    Lab Results  Component Value Date   TSH 0.325 (L) 05/16/2014       Component Value Date/Time   CHOL 94 09/01/2011 0340   HDL 4 (L) 09/01/2011 0340   VLDL 33 09/01/2011 0340   LDLCALC 57 09/01/2011 0340    Lab Results  Component Value Date   AST 68 (H) 05/15/2014   Lab Results  Component Value Date   ALT 54 05/15/2014   No components found for: ALKALINEPHOPHATASE No components found for: BILIRUBINTOTAL  No results found for: ESTRADIOL  Urinalysis    Component Value Date/Time   COLORURINE Yellow 05/15/2014 1034   APPEARANCEUR Cloudy (A) 07/08/2017 1431   LABSPEC 1.017 05/15/2014 1034   PHURINE 6.0 05/15/2014 1034   GLUCOSEU Negative 07/08/2017 1431   GLUCOSEU Negative 05/15/2014 1034   HGBUR 3+ 05/15/2014 1034   BILIRUBINUR Negative 07/08/2017 1431   BILIRUBINUR Negative 05/15/2014 1034   KETONESUR 1+ 05/15/2014 1034   PROTEINUR Negative 07/08/2017 1431   PROTEINUR 30 mg/dL  05/15/2014 1034   NITRITE Negative 07/08/2017 1431   NITRITE Negative 05/15/2014 1034   LEUKOCYTESUR 2+ (A) 07/08/2017 1431   LEUKOCYTESUR 2+ 05/15/2014 1034    I have reviewed the labs.   Pertinent Imaging: --Bilaterally echogenic kidneys which can be seen in setting of medical renal disease.  -- Nonobstructing 0.5 cm calculus in the right lower pole.    Please see below for data measurements:  Right kidney: 10.9 cm Left kidney:9.3 cm Bladder volume: 14.23mL   Result Narrative  EXAM: [place] RETROPERITONEAL COMPLETE (AO/IVC) DATE: 02/14/2017 10:05 AM ACCESSION: [id number] DICTATED: 02/14/2017 10:15 AM INTERPRETATION LOCATION: [place]  CLINICAL INDICATION: 83 years old Female with evaluate size and echogenicity of kidneys-N18.3-CKD (chronic kidney disease) stage 3, GFR 30-59 ml/min (CMS-HCC)  COMPARISON: None.  TECHNIQUE: Static and cine images of the kidneys, bladder and retroperitoneum were performed.  FINDINGS:  KIDNEYS: Echogenic kidneys bilaterally. No solid masses. Right lower pole nonobstructing 0.5 cm calculus. No left renal calculi. No hydronephrosis.  BLADDER: Decompressed, limiting evaluation.  VESSELS: The visualized portions of the abdominal aorta were unremarkable. The IVC was not well visualized due to overlying bowel gas.  Other Result Information  Interface, Rad Results In - 02/14/2017 10:58 AM EDT EXAM: [place] RETROPERITONEAL COMPLETE (AO/IVC) DATE: 02/14/2017 10:05 AM ACCESSION: [id number] UN DICTATED: 02/14/2017 10:15 AM INTERPRETATION LOCATION: [place]  CLINICAL INDICATION: 83 years old Female with evaluate size and echogenicity of kidneys-N18.3-CKD (chronic kidney disease) stage 3, GFR 30-59 ml/min (CMS-HCC)    COMPARISON: None.  TECHNIQUE: Static and cine images of the kidneys, bladder and retroperitoneum were performed.  FINDINGS:  KIDNEYS: Echogenic kidneys bilaterally. No solid masses. Right lower pole nonobstructing 0.5 cm  calculus. No left renal calculi. No hydronephrosis.  BLADDER: Decompressed, limiting evaluation.  VESSELS: The visualized portions of the abdominal aorta were unremarkable. The IVC was not well visualized due to overlying bowel gas.   IMPRESSION:  --Bilaterally echogenic kidneys which can be seen in setting of medical renal disease.  -- Nonobstructing 0.5 cm calculus in the right lower pole.    Please see below for data measurements:  Right kidney: 10.9 cm Left kidney:  9.3 cm Bladder volume: 14.2  mL     Assessment & Plan:    1. Urge  incontinence  - offered behavioral therapies, bladder training, bladder control strategies and pelvic floor muscle training - not a candidate due to Alzheimer's   - fluid management - good water intake  - continue Myrbetriq 25 mg daily; refills given  - RTC in 3 months for PVR and symptom recheck   Return in about 3 months (around 10/30/2017) for PVR.  These notes generated with voice recognition software. I apologize for typographical errors.  Zara Council, [facility] [address], [place], [place] [address] [phone]

## 2017-07-30 ENCOUNTER — Ambulatory Visit (INDEPENDENT_AMBULATORY_CARE_PROVIDER_SITE_OTHER): Payer: Medicare Other | Admitting: Urology

## 2017-07-30 ENCOUNTER — Encounter: Payer: Self-pay | Admitting: Urology

## 2017-07-30 VITALS — BP 133/88 | HR 67 | Resp 16 | Wt 186.7 lb

## 2017-07-30 DIAGNOSIS — N3941 Urge incontinence: Secondary | ICD-10-CM

## 2017-07-30 LAB — BLADDER SCAN AMB NON-IMAGING: Scan Result: 0

## 2017-07-30 MED ORDER — MIRABEGRON ER 25 MG PO TB24: 25.0000 mg | ORAL_TABLET | Freq: Every day | ORAL | 3 refills | Status: DC

## 2017-08-12 ENCOUNTER — Other Ambulatory Visit: Payer: Self-pay

## 2017-08-12 MED ORDER — MIRABEGRON ER 25 MG PO TB24: 25.0000 mg | ORAL_TABLET | Freq: Every day | ORAL | 3 refills | Status: DC

## 2017-08-23 ENCOUNTER — Telehealth: Payer: Self-pay

## 2017-08-23 NOTE — Telephone Encounter (Signed)
LM[facility] PA for myrbetriq denied and an appeal was completed. [employer] will not completed appeal until pt or POA signs consent for the appeal to be completed.

## 2017-10-28 NOTE — Progress Notes (Signed)
10/29/2017 9:42 AM   Kaitlin [name] 1934/08/10 [id number]  Referring provider: Ellamae Sia, MD [address] [place], [place] [address]  Chief Complaint  Patient presents with  . Urinary Incontinence    HPI: Patient is an 83 year old Caucasian female with recurrent UTIs, vaginal atrophy, right renal stone and urgency who presents today for a 46-month follow-up.   History of recurrent UTIs  + E. Coli in 01/2017 + Cirtobacter koseri in 06/2017  Risk factors for UTI's:  Age, Alzheimer's, vaginal atrophy, incontinence, constipation  RUS in 02/2017 at [facility] noted bilaterally echogenic kidneys which can be seen in the setting of medical renal disease.  Nonobstructing 0.5 cm calculus in the right lower pole.  Patient denies any gross hematuria, dysuria or suprapubic/flank pain.  Patient denies any fevers, chills, nausea or vomiting.   Right renal stone She has underwent URS and possibility PCNL or nephrostomy tube placement in the 90's. RUS in 02/2017 noted non obstructing 0.5 cm calculus in the right lower pole.  She has been diagnosed with primary hyperparathyroidism.  Vaginal atrophy Had been on vaginal estrogen cream in the past but was taken off the medication for unknown reasons  Urgency Currently on Myrbetriq 25 mg daily.  The patient has been experiencing urgency x 8 or more, frequency x 8 or more, not restricting fluids to avoid visits to the restroom, is engaging in toilet mapping, incontinence x 8 or more and nocturia x 0-3. BP is 122/80.  PVR is 14 mL.   Her biggest complaints today are frequency and incontinence.  She states that her insurance would not cover the Myrbetriq.  She is comfortable with controlling her incontinence with the pads.  She does not want to try an anticholinergic at this time after we discussed the side effects of dry eyes, dry mouth, constipation, mental confusion and/or urinary retention.   She was also not interested in PTNS at this  time.    PMH: Past Medical History:  Diagnosis Date  . Cystitis   . Diffuse cystic mastopathy   . Gout   . Hypertension     Surgical History: Past Surgical History:  Procedure Laterality Date  . ABDOMINAL HYSTERECTOMY  1980  . BLADDER STONE REMOVAL  2008  . BREAST LUMPECTOMY  1974  . COLONOSCOPY  1994  . LAPAROSCOPY  2013   gallstone removal  . NASAL SINUS SURGERY    . OOPHORECTOMY Bilateral     Home Medications:  Allergies as of 10/29/2017      Reactions   Augmentin [amoxicillin-pot Clavulanate] Nausea And Vomiting, Rash   Tetracyclines & Related Rash      Medication List        Accurate as of 10/29/17  9:42 AM. Always use your most recent med list.          alendronate 40 MG tablet Commonly known as:  FOSAMAX Take 40 mg by mouth every 7 (seven) days. Take with a full glass of water on an empty stomach.   amLODipine 5 MG tablet Commonly known as:  NORVASC   aspirin 81 MG tablet Take 81 mg by mouth daily.   cholecalciferol 400 UNIT/ML Liqd Commonly known as:  D-VI-SOL Take 400 Units by mouth daily.   donepezil 10 MG tablet Commonly known as:  ARICEPT Take 10 mg by mouth at bedtime as needed.   loratadine 10 MG tablet Commonly known as:  CLARITIN Take 10 mg by mouth daily.   pravastatin 40 MG tablet  Commonly known as:  PRAVACHOL   vitamin E 400 UNIT capsule Generic drug:  vitamin E Take 400 Units by mouth daily.       Allergies:  Allergies  Allergen Reactions  . Augmentin [Amoxicillin-Pot Clavulanate] Nausea And Vomiting and Rash  . Tetracyclines & Related Rash    Family History: History reviewed. No pertinent family history.  Social History:  reports that she has quit smoking. Her smoking use included cigarettes. She smoked 0.50 packs per day. She has never used smokeless tobacco. She reports that she does not drink alcohol or use drugs.  ROS: UROLOGY Frequent Urination?: Yes Hard to postpone urination?: No Burning/pain with  urination?: No Get up at night to urinate?: No Leakage of urine?: Yes Urine stream starts and stops?: No Trouble starting stream?: No Do you have to strain to urinate?: No Blood in urine?: No Urinary tract infection?: No Sexually transmitted disease?: No Injury to kidneys or bladder?: No Painful intercourse?: No Weak stream?: No Currently pregnant?: No Vaginal bleeding?: No Last menstrual period?: n  Gastrointestinal Nausea?: No Vomiting?: No Indigestion/heartburn?: No Diarrhea?: No Constipation?: No  Constitutional Fever: No Night sweats?: No Weight loss?: No Fatigue?: No  Skin Skin rash/lesions?: No Itching?: No  Eyes Blurred vision?: No Double vision?: No  Ears/Nose/Throat Sore throat?: No Sinus problems?: No  Hematologic/Lymphatic Swollen glands?: No Easy bruising?: No  Cardiovascular Leg swelling?: No Chest pain?: No  Respiratory Cough?: No Shortness of breath?: No  Endocrine Excessive thirst?: No  Musculoskeletal Back pain?: No Joint pain?: No  Neurological Headaches?: No Dizziness?: No  Psychologic Depression?: No Anxiety?: No  Physical Exam: BP 122/80 (BP Location: Right Arm, Patient Position: Sitting, Cuff Size: Normal)   Pulse 66   Ht 5' (1.524 m)   Wt 185 lb 1.6 oz (84 kg)   BMI 36.15 kg/m   Constitutional: Well nourished. Alert and oriented, No acute distress. HEENT: [place] AT, moist mucus membranes. Trachea midline, no masses. Cardiovascular: No clubbing, cyanosis, or edema. Respiratory: Normal respiratory effort, no increased work of breathing. Skin: No rashes, bruises or suspicious lesions. Lymph: No cervical or inguinal adenopathy. Neurologic: Grossly intact, no focal deficits, moving all 4 extremities. Psychiatric: Normal mood and affect.  Laboratory Data: Lab Results  Component Value Date   WBC 12.8 (H) 05/16/2014   HGB 12.9 05/16/2014   HCT 40.7 05/16/2014   MCV 88 05/16/2014   PLT 267 05/16/2014    Lab  Results  Component Value Date   CREATININE 0.66 05/16/2014    No results found for: PSA  No results found for: TESTOSTERONE  Lab Results  Component Value Date   HGBA1C 5.7 09/01/2011    Lab Results  Component Value Date   TSH 0.325 (L) 05/16/2014       Component Value Date/Time   CHOL 94 09/01/2011 0340   HDL 4 (L) 09/01/2011 0340   VLDL 33 09/01/2011 0340   LDLCALC 57 09/01/2011 0340    Lab Results  Component Value Date   AST 68 (H) 05/15/2014   Lab Results  Component Value Date   ALT 54 05/15/2014   No components found for: ALKALINEPHOPHATASE No components found for: BILIRUBINTOTAL  No results found for: ESTRADIOL  Urinalysis    Component Value Date/Time   COLORURINE Yellow 05/15/2014 1034   APPEARANCEUR Cloudy (A) 07/08/2017 1431   LABSPEC 1.017 05/15/2014 1034   PHURINE 6.0 05/15/2014 1034   GLUCOSEU Negative 07/08/2017 1431   GLUCOSEU Negative 05/15/2014 1034   HGBUR 3+ 05/15/2014 1034  BILIRUBINUR Negative 07/08/2017 1431   BILIRUBINUR Negative 05/15/2014 1034   KETONESUR 1+ 05/15/2014 1034   PROTEINUR Negative 07/08/2017 1431   PROTEINUR 30 mg/dL 05/15/2014 1034   NITRITE Negative 07/08/2017 1431   NITRITE Negative 05/15/2014 1034   LEUKOCYTESUR 2+ (A) 07/08/2017 1431   LEUKOCYTESUR 2+ 05/15/2014 1034    I have reviewed the labs.   Pertinent Imaging: Results for CORBYN, STEEDMAN (MRN [id number]) as of 10/29/2017 09:33  Ref. Range 10/29/2017 09:32  Scan Result Unknown 14    Assessment & Plan:    1. Urge incontinence Controlled with Myrbetriq 25 mg daily - insurance would not cover the Myrbetriq  After discussing the side effect profiles of the anticholinergics that her insurance company was suggesting she take in place of the Myrbetriq, she did not want to pursue that course of treatment We also discussed PTNS therapy and she is not interested in this treatment at this time She will continue to manage her urge incontinence with  incontinence pads RTC in 12 months for OAB questionnaire and PVR   2. Right renal stone RTC in one year for KUB Advised to contact our office or seek treatment in the ED if becomes febrile or pain/ vomiting are difficult control in order to arrange for emergent/urgent intervention  3. History of recurrent UTI's Asked to contact the office for symptoms of an UTI  4. Vaginal atrophy Vaginal estrogen cream discontinued for unknown reasons -we will not restart it unless recurrent UTIs become problematic     Return in about 1 year (around 10/30/2018) for KUB and PVR .  These notes generated with voice recognition software. I apologize for typographical errors.  Zara Council, PA-C  [facility] [address] [place], [place] [address] [phone]

## 2017-10-29 ENCOUNTER — Encounter: Payer: Self-pay | Admitting: Urology

## 2017-10-29 ENCOUNTER — Ambulatory Visit (INDEPENDENT_AMBULATORY_CARE_PROVIDER_SITE_OTHER): Payer: Medicare Other | Admitting: Urology

## 2017-10-29 VITALS — BP 122/80 | HR 66 | Ht 60.0 in | Wt 185.1 lb

## 2017-10-29 DIAGNOSIS — N200 Calculus of kidney: Secondary | ICD-10-CM | POA: Diagnosis not present

## 2017-10-29 DIAGNOSIS — N952 Postmenopausal atrophic vaginitis: Secondary | ICD-10-CM | POA: Diagnosis not present

## 2017-10-29 DIAGNOSIS — N3941 Urge incontinence: Secondary | ICD-10-CM | POA: Diagnosis not present

## 2017-10-29 DIAGNOSIS — Z87440 Personal history of urinary (tract) infections: Secondary | ICD-10-CM

## 2017-10-29 LAB — BLADDER SCAN AMB NON-IMAGING: Scan Result: 14

## 2017-11-22 ENCOUNTER — Other Ambulatory Visit: Payer: Self-pay | Admitting: Internal Medicine

## 2017-11-22 DIAGNOSIS — Z1231 Encounter for screening mammogram for malignant neoplasm of breast: Secondary | ICD-10-CM

## 2017-12-11 ENCOUNTER — Ambulatory Visit
Admission: RE | Admit: 2017-12-11 | Discharge: 2017-12-11 | Disposition: A | Discharge status: Home / Self Care | Payer: Medicare Other | Source: Ambulatory Visit | Attending: Internal Medicine | Admitting: Internal Medicine

## 2017-12-11 DIAGNOSIS — Z1231 Encounter for screening mammogram for malignant neoplasm of breast: Secondary | ICD-10-CM | POA: Diagnosis not present

## 2018-10-28 DIAGNOSIS — Z87442 Personal history of urinary calculi: Secondary | ICD-10-CM | POA: Insufficient documentation

## 2018-10-28 NOTE — Progress Notes (Deleted)
10/29/2018 8:31 AM   West Carbo Stroud 1935-04-02 [id number]  Referring provider: Ellamae Sia, MD [address] [place],  [place] [address]  No chief complaint on file.   HPI: Patient is an 84 year old Caucasian female with a history of recurrent UTIs, vaginal atrophy, right renal stone and urgency who presents today for a 25-month follow-up with her husband,  Buddy.    History of recurrent UTIs  + E. Coli in 01/2017 + Cirtobacter koseri in 06/2017  Risk factors for UTI's:  Age, Alzheimer's, vaginal atrophy, incontinence, constipation  RUS in 02/2017 at [facility] noted bilaterally echogenic kidneys which can be seen in the setting of medical renal disease.  Nonobstructing 0.5 cm calculus in the right lower pole.  She has not had an UTI, since we last saw her one year ago.    Right renal stone She has underwent URS and possibility PCNL or nephrostomy tube placement in the 90's. RUS in 02/2017 noted non obstructing 0.5 cm calculus in the right lower pole.  She has been diagnosed with primary hyperparathyroidism.  Vaginal atrophy Had been on vaginal estrogen cream in the past but was taken off the medication for unknown reasons.  Last Mammogram in 11/2017 was negative.    Urgency The patient is  experiencing urgency x 8 or more (unchanged), frequency x 8 or more (unchanged), not restricting fluids to avoid visits to the restroom, is engaging in toilet mapping, incontinence x 0-3 (improved) and nocturia x 0-3 (improved).   Her BP is 90/59.   Her PVR is 0 mL.  She has stopped wearing pads and has eliminated other fluids from her diet for the exception of water.  Patient denies any gross hematuria, dysuria or suprapubic/flank pain.  Patient denies any fevers, chills, nausea or vomiting.    PMH: Past Medical History:  Diagnosis Date  . Cystitis   . Diffuse cystic mastopathy   . Gout   . Hypertension     Surgical History: Past Surgical History:  Procedure Laterality Date   . ABDOMINAL HYSTERECTOMY  1980  . BLADDER STONE REMOVAL  2008  . BREAST LUMPECTOMY Right 1974   positive-no radiation or chemo per pt  . COLONOSCOPY  1994  . LAPAROSCOPY  2013   gallstone removal  . NASAL SINUS SURGERY    . OOPHORECTOMY Bilateral     Home Medications:  Allergies as of 10/29/2018      Reactions   Augmentin [amoxicillin-pot Clavulanate] Nausea And Vomiting, Rash   Tetracyclines & Related Rash      Medication List       Accurate as of October 29, 2018  8:31 AM. If you have any questions, ask your nurse or doctor.        alendronate 40 MG tablet Commonly known as: FOSAMAX Take 40 mg by mouth every 7 (seven) days. Take with a full glass of water on an empty stomach.   amLODipine 5 MG tablet Commonly known as: NORVASC   aspirin 81 MG tablet Take 81 mg by mouth daily.   cholecalciferol 400 UNIT/ML Liqd Commonly known as: D-VI-SOL Take 400 Units by mouth daily.   donepezil 10 MG tablet Commonly known as: ARICEPT Take 10 mg by mouth at bedtime as needed.   loratadine 10 MG tablet Commonly known as: CLARITIN Take 10 mg by mouth daily.   pravastatin 40 MG tablet Commonly known as: PRAVACHOL   vitamin E 400 UNIT capsule Generic drug: vitamin E Take 400 Units by mouth  daily.       Allergies:  Allergies  Allergen Reactions  . Augmentin [Amoxicillin-Pot Clavulanate] Nausea And Vomiting and Rash  . Tetracyclines & Related Rash    Family History: Family History  Problem Relation Age of Onset  . Breast cancer Neg Hx     Social History:  reports that she has quit smoking. Her smoking use included cigarettes. She smoked 0.50 packs per day. She has never used smokeless tobacco. She reports that she does not drink alcohol or use drugs.  ROS: UROLOGY Frequent Urination?: No Hard to postpone urination?: No Burning/pain with urination?: No Get up at night to urinate?: No Leakage of urine?: No Urine stream starts and stops?: No Trouble starting  stream?: No Do you have to strain to urinate?: No Blood in urine?: No Urinary tract infection?: No Sexually transmitted disease?: No Injury to kidneys or bladder?: No Painful intercourse?: No Weak stream?: No Currently pregnant?: No Vaginal bleeding?: No Last menstrual period?: n  Gastrointestinal Nausea?: No Vomiting?: No Indigestion/heartburn?: No Diarrhea?: No Constipation?: No  Constitutional Fever: No Night sweats?: No Weight loss?: No Fatigue?: No  Skin Skin rash/lesions?: No Itching?: No  Eyes Blurred vision?: No Double vision?: No  Ears/Nose/Throat Sore throat?: No Sinus problems?: No  Hematologic/Lymphatic Swollen glands?: No Easy bruising?: No  Cardiovascular Leg swelling?: No Chest pain?: No  Respiratory Cough?: No Shortness of breath?: No  Endocrine Excessive thirst?: No  Musculoskeletal Back pain?: No Joint pain?: No  Neurological Headaches?: No Dizziness?: No  Psychologic Depression?: No Anxiety?: No  Physical Exam: BP (!) 90/59   Pulse 86   Ht 5\' 6"  (1.676 m)   Wt 180 lb (81.6 kg)   BMI 29.05 kg/m   Constitutional:  Well nourished. Alert and oriented, No acute distress. HEENT: [place] AT, moist mucus membranes.  Trachea midline, no masses. Cardiovascular: No clubbing, cyanosis, or edema. Respiratory: Normal respiratory effort, no increased work of breathing. Neurologic: Grossly intact, no focal deficits, moving all 4 extremities. Psychiatric: Normal mood and affect.   Laboratory Data: Lab Results  Component Value Date   WBC 12.8 (H) 05/16/2014   HGB 12.9 05/16/2014   HCT 40.7 05/16/2014   MCV 88 05/16/2014   PLT 267 05/16/2014    Lab Results  Component Value Date   CREATININE 0.66 05/16/2014    No results found for: PSA  No results found for: TESTOSTERONE  Lab Results  Component Value Date   HGBA1C 5.7 09/01/2011    Lab Results  Component Value Date   TSH 0.325 (L) 05/16/2014       Component Value  Date/Time   CHOL 94 09/01/2011 0340   HDL 4 (L) 09/01/2011 0340   VLDL 33 09/01/2011 0340   LDLCALC 57 09/01/2011 0340    Lab Results  Component Value Date   AST 68 (H) 05/15/2014   Lab Results  Component Value Date   ALT 54 05/15/2014   No components found for: ALKALINEPHOPHATASE No components found for: BILIRUBINTOTAL  No results found for: ESTRADIOL  Urinalysis    Component Value Date/Time   COLORURINE Yellow 05/15/2014 1034   APPEARANCEUR Cloudy (A) 07/08/2017 1431   LABSPEC 1.017 05/15/2014 1034   PHURINE 6.0 05/15/2014 1034   GLUCOSEU Negative 07/08/2017 1431   GLUCOSEU Negative 05/15/2014 1034   HGBUR 3+ 05/15/2014 1034   BILIRUBINUR Negative 07/08/2017 1431   BILIRUBINUR Negative 05/15/2014 1034   KETONESUR 1+ 05/15/2014 1034   PROTEINUR Negative 07/08/2017 1431   PROTEINUR 30 mg/dL 05/15/2014 1034  NITRITE Negative 07/08/2017 1431   NITRITE Negative 05/15/2014 1034   LEUKOCYTESUR 2+ (A) 07/08/2017 1431   LEUKOCYTESUR 2+ 05/15/2014 1034    I have reviewed the labs.   Pertinent Imaging: CLINICAL DATA:  Kidney stones  EXAM: ABDOMEN - 1 VIEW  COMPARISON:  05/15/2014 pelvic radiograph  FINDINGS: 4 x 4 mm calculus projects over upper pole of LEFT kidney.  No additional urinary tract calcifications.  Small pelvic phleboliths noted.  Degenerative disc disease changes of the thoracolumbar spine.  Bowel gas pattern normal.  IMPRESSION: 4 x 4 mm LEFT renal calculus.   Electronically Signed   By: Lavonia Dana M.D.   On: 10/29/2018 14:29  I have independently reviewed the films and note the left renal calculus and possibly a right renal stone.    Results for KEARRA, CALKIN (MRN [id number]) as of 10/29/2018 08:34  Ref. Range 10/29/2017 09:32  Scan Result Unknown 14     Assessment & Plan:    1. Urge incontinence Mrs. Heitzenrater is at goal with her behavioral and dietary changes RTC in one year for OAB questionnaire and PVR  2. Right  renal stone KUB *** RTC in one year for KUB Advised to contact our office or seek treatment in the ED if becomes febrile or pain/ vomiting are difficult control in order to arrange for emergent/urgent intervention  3. History of recurrent UTI's Asked to contact the office for symptoms of an UTI  4. Vaginal atrophy Vaginal estrogen cream discontinued for unknown reasons -we will not restart it unless recurrent UTIs become problematic     No follow-ups on file.  These notes generated with voice recognition software. I apologize for typographical errors.  Zara Council, PA-C  [facility] [address] [place], [place] [address] [phone]

## 2018-10-29 ENCOUNTER — Encounter: Payer: Self-pay | Admitting: Urology

## 2018-10-29 ENCOUNTER — Other Ambulatory Visit: Payer: Self-pay

## 2018-10-29 ENCOUNTER — Telehealth: Payer: Self-pay | Admitting: *Deleted

## 2018-10-29 ENCOUNTER — Ambulatory Visit
Admission: RE | Admit: 2018-10-29 | Discharge: 2018-10-29 | Disposition: A | Discharge status: Home / Self Care | Payer: Medicare Other | Attending: Urology | Admitting: Urology

## 2018-10-29 ENCOUNTER — Ambulatory Visit
Admission: RE | Admit: 2018-10-29 | Discharge: 2018-10-29 | Disposition: A | Discharge status: Home / Self Care | Payer: Medicare Other | Source: Ambulatory Visit | Attending: Urology | Admitting: Urology

## 2018-10-29 ENCOUNTER — Ambulatory Visit (INDEPENDENT_AMBULATORY_CARE_PROVIDER_SITE_OTHER): Payer: Medicare Other | Admitting: Urology

## 2018-10-29 VITALS — BP 90/59 | HR 86 | Ht 66.0 in | Wt 180.0 lb

## 2018-10-29 DIAGNOSIS — N200 Calculus of kidney: Secondary | ICD-10-CM

## 2018-10-29 DIAGNOSIS — N952 Postmenopausal atrophic vaginitis: Secondary | ICD-10-CM

## 2018-10-29 DIAGNOSIS — Z87440 Personal history of urinary (tract) infections: Secondary | ICD-10-CM

## 2018-10-29 DIAGNOSIS — N3941 Urge incontinence: Secondary | ICD-10-CM | POA: Diagnosis not present

## 2018-10-29 LAB — BLADDER SCAN AMB NON-IMAGING

## 2018-10-29 NOTE — Telephone Encounter (Addendum)
Informed patient-verbalized understanding.   ----- Message from Nori Riis, PA-C sent at 10/29/2018  3:02 PM EDT ----- Would you let Mrs. Frieze know that her xray shows a left kidney stone and possibly a right kidney stone?  I would like to have the KUB repeated in 6 months.

## 2018-10-29 NOTE — Progress Notes (Signed)
10/29/2018 3:04 PM   [place] 1935-01-20 [id number]  Referring provider: Ellamae Sia, MD [address] [place],  [place] [address]  No chief complaint on file.   HPI: Patient is an 84 year old Caucasian female with a history of recurrent UTIs, vaginal atrophy, right renal stone and urgency who presents today for a 63-month follow-up with her husband,  Buddy.    History of recurrent UTIs  + E. Coli in 01/2017 + Cirtobacter koseri in 06/2017  Risk factors for UTI's:  Age, Alzheimer's, vaginal atrophy, incontinence, constipation  RUS in 02/2017 at [facility] noted bilaterally echogenic kidneys which can be seen in the setting of medical renal disease.  Nonobstructing 0.5 cm calculus in the right lower pole.  She has not had an UTI, since we last saw her one year ago.    Right renal stone She has underwent URS and possibility PCNL or nephrostomy tube placement in the 90's. RUS in 02/2017 noted non obstructing 0.5 cm calculus in the right lower pole.  She has been diagnosed with primary hyperparathyroidism.  Vaginal atrophy Had been on vaginal estrogen cream in the past but was taken off the medication for unknown reasons.  Last Mammogram in 11/2017 was negative.    Urgency The patient is  experiencing urgency x 8 or more (unchanged), frequency x 8 or more (unchanged), not restricting fluids to avoid visits to the restroom, is engaging in toilet mapping, incontinence x 0-3 (improved) and nocturia x 0-3 (improved).   Her BP is 90/59.   Her PVR is 0 mL.  She has stopped wearing pads and has eliminated other fluids from her diet for the exception of water.  Patient denies any gross hematuria, dysuria or suprapubic/flank pain.  Patient denies any fevers, chills, nausea or vomiting.    PMH: Past Medical History:  Diagnosis Date  . Cystitis   . Diffuse cystic mastopathy   . Gout   . Hypertension     Surgical History: Past Surgical History:  Procedure Laterality Date   . ABDOMINAL HYSTERECTOMY  1980  . BLADDER STONE REMOVAL  2008  . BREAST LUMPECTOMY Right 1974   positive-no radiation or chemo per pt  . COLONOSCOPY  1994  . LAPAROSCOPY  2013   gallstone removal  . NASAL SINUS SURGERY    . OOPHORECTOMY Bilateral     Home Medications:  Allergies as of 10/29/2018      Reactions   Augmentin [amoxicillin-pot Clavulanate] Nausea And Vomiting, Rash   Tetracyclines & Related Rash      Medication List       Accurate as of October 29, 2018  3:04 PM. If you have any questions, ask your nurse or doctor.        alendronate 40 MG tablet Commonly known as: FOSAMAX Take 40 mg by mouth every 7 (seven) days. Take with a full glass of water on an empty stomach.   amLODipine 5 MG tablet Commonly known as: NORVASC   aspirin 81 MG tablet Take 81 mg by mouth daily.   cholecalciferol 400 UNIT/ML Liqd Commonly known as: D-VI-SOL Take 400 Units by mouth daily.   donepezil 10 MG tablet Commonly known as: ARICEPT Take 10 mg by mouth at bedtime as needed.   loratadine 10 MG tablet Commonly known as: CLARITIN Take 10 mg by mouth daily.   pravastatin 40 MG tablet Commonly known as: PRAVACHOL   vitamin E 400 UNIT capsule Generic drug: vitamin E Take 400 Units by mouth  daily.       Allergies:  Allergies  Allergen Reactions  . Augmentin [Amoxicillin-Pot Clavulanate] Nausea And Vomiting and Rash  . Tetracyclines & Related Rash    Family History: Family History  Problem Relation Age of Onset  . Breast cancer Neg Hx     Social History:  reports that she has quit smoking. Her smoking use included cigarettes. She smoked 0.50 packs per day. She has never used smokeless tobacco. She reports that she does not drink alcohol or use drugs.  ROS: UROLOGY Frequent Urination?: No Hard to postpone urination?: No Burning/pain with urination?: No Get up at night to urinate?: No Leakage of urine?: No Urine stream starts and stops?: No Trouble starting  stream?: No Do you have to strain to urinate?: No Blood in urine?: No Urinary tract infection?: No Sexually transmitted disease?: No Injury to kidneys or bladder?: No Painful intercourse?: No Weak stream?: No Currently pregnant?: No Vaginal bleeding?: No Last menstrual period?: n  Gastrointestinal Nausea?: No Vomiting?: No Indigestion/heartburn?: No Diarrhea?: No Constipation?: No  Constitutional Fever: No Night sweats?: No Weight loss?: No Fatigue?: No  Skin Skin rash/lesions?: No Itching?: No  Eyes Blurred vision?: No Double vision?: No  Ears/Nose/Throat Sore throat?: No Sinus problems?: No  Hematologic/Lymphatic Swollen glands?: No Easy bruising?: No  Cardiovascular Leg swelling?: No Chest pain?: No  Respiratory Cough?: No Shortness of breath?: No  Endocrine Excessive thirst?: No  Musculoskeletal Back pain?: No Joint pain?: No  Neurological Headaches?: No Dizziness?: No  Psychologic Depression?: No Anxiety?: No  Physical Exam: BP (!) 90/59   Pulse 86   Ht 5\' 6"  (1.676 m)   Wt 180 lb (81.6 kg)   BMI 29.05 kg/m   Constitutional:  Well nourished. Alert and oriented, No acute distress. HEENT: [place] AT, moist mucus membranes.  Trachea midline, no masses. Cardiovascular: No clubbing, cyanosis, or edema. Respiratory: Normal respiratory effort, no increased work of breathing. Neurologic: Grossly intact, no focal deficits, moving all 4 extremities. Psychiatric: Normal mood and affect.   Laboratory Data: Lab Results  Component Value Date   WBC 12.8 (H) 05/16/2014   HGB 12.9 05/16/2014   HCT 40.7 05/16/2014   MCV 88 05/16/2014   PLT 267 05/16/2014    Lab Results  Component Value Date   CREATININE 0.66 05/16/2014    No results found for: PSA  No results found for: TESTOSTERONE  Lab Results  Component Value Date   HGBA1C 5.7 09/01/2011    Lab Results  Component Value Date   TSH 0.325 (L) 05/16/2014       Component Value  Date/Time   CHOL 94 09/01/2011 0340   HDL 4 (L) 09/01/2011 0340   VLDL 33 09/01/2011 0340   LDLCALC 57 09/01/2011 0340    Lab Results  Component Value Date   AST 68 (H) 05/15/2014   Lab Results  Component Value Date   ALT 54 05/15/2014   No components found for: ALKALINEPHOPHATASE No components found for: BILIRUBINTOTAL  No results found for: ESTRADIOL  Urinalysis    Component Value Date/Time   COLORURINE Yellow 05/15/2014 1034   APPEARANCEUR Cloudy (A) 07/08/2017 1431   LABSPEC 1.017 05/15/2014 1034   PHURINE 6.0 05/15/2014 1034   GLUCOSEU Negative 07/08/2017 1431   GLUCOSEU Negative 05/15/2014 1034   HGBUR 3+ 05/15/2014 1034   BILIRUBINUR Negative 07/08/2017 1431   BILIRUBINUR Negative 05/15/2014 1034   KETONESUR 1+ 05/15/2014 1034   PROTEINUR Negative 07/08/2017 1431   PROTEINUR 30 mg/dL 05/15/2014 1034  NITRITE Negative 07/08/2017 1431   NITRITE Negative 05/15/2014 1034   LEUKOCYTESUR 2+ (A) 07/08/2017 1431   LEUKOCYTESUR 2+ 05/15/2014 1034    I have reviewed the labs.   Pertinent Imaging: CLINICAL DATA:  Kidney stones  EXAM: ABDOMEN - 1 VIEW  COMPARISON:  05/15/2014 pelvic radiograph  FINDINGS: 4 x 4 mm calculus projects over upper pole of LEFT kidney.  No additional urinary tract calcifications.  Small pelvic phleboliths noted.  Degenerative disc disease changes of the thoracolumbar spine.  Bowel gas pattern normal.  IMPRESSION: 4 x 4 mm LEFT renal calculus.   Electronically Signed   By: Lavonia Dana M.D.   On: 10/29/2018 14:29  I have independently reviewed the films and note the left renal calculus and possibly a right renal stone.    Results for LOREEN, BANKSON (MRN [id number]) as of 10/29/2018 08:34  Ref. Range 10/29/2017 09:32  Scan Result Unknown 14     Assessment & Plan:    1. Urge incontinence Mrs. Belgard is at goal with her behavioral and dietary changes RTC in one year for OAB questionnaire and PVR  2. Right  renal stone KUB demonstrates a left renal stone a possibly a right renal stone RTC in 6 months for KUB Advised to contact our office or seek treatment in the ED if becomes febrile or pain/ vomiting are difficult control in order to arrange for emergent/urgent intervention  3. History of recurrent UTI's Asked to contact the office for symptoms of an UTI  4. Vaginal atrophy Vaginal estrogen cream discontinued for unknown reasons -we will not restart it unless recurrent UTIs become problematic  Return in about 1 year (around 10/29/2019) for PVR, KUB and OAB questionnaire.  These notes generated with voice recognition software. I apologize for typographical errors.  Zara Council, PA-C  [facility] [address] [place], [place] [address] [phone]

## 2019-02-11 ENCOUNTER — Other Ambulatory Visit: Payer: Self-pay | Admitting: Internal Medicine

## 2019-02-11 DIAGNOSIS — Z1231 Encounter for screening mammogram for malignant neoplasm of breast: Secondary | ICD-10-CM

## 2019-04-02 ENCOUNTER — Ambulatory Visit
Admission: RE | Admit: 2019-04-02 | Discharge: 2019-04-02 | Disposition: A | Discharge status: Home / Self Care | Payer: Medicare Other | Source: Ambulatory Visit | Attending: Internal Medicine | Admitting: Internal Medicine

## 2019-04-02 DIAGNOSIS — Z1231 Encounter for screening mammogram for malignant neoplasm of breast: Secondary | ICD-10-CM | POA: Insufficient documentation

## 2019-10-09 ENCOUNTER — Other Ambulatory Visit: Payer: Self-pay | Admitting: Radiology

## 2019-10-09 DIAGNOSIS — N200 Calculus of kidney: Secondary | ICD-10-CM

## 2019-10-29 ENCOUNTER — Ambulatory Visit
Admission: RE | Admit: 2019-10-29 | Discharge: 2019-10-29 | Disposition: A | Discharge status: Home / Self Care | Payer: Medicare Other | Source: Ambulatory Visit | Attending: Urology | Admitting: Urology

## 2019-10-29 ENCOUNTER — Encounter: Payer: Self-pay | Admitting: Urology

## 2019-10-29 ENCOUNTER — Ambulatory Visit (INDEPENDENT_AMBULATORY_CARE_PROVIDER_SITE_OTHER): Payer: Medicare Other | Admitting: Urology

## 2019-10-29 ENCOUNTER — Other Ambulatory Visit: Payer: Self-pay

## 2019-10-29 ENCOUNTER — Ambulatory Visit
Admission: RE | Admit: 2019-10-29 | Discharge: 2019-10-29 | Disposition: A | Discharge status: Home / Self Care | Payer: Medicare Other | Attending: Urology | Admitting: Urology

## 2019-10-29 VITALS — BP 103/69 | HR 77 | Ht 66.0 in | Wt 198.0 lb

## 2019-10-29 DIAGNOSIS — N200 Calculus of kidney: Secondary | ICD-10-CM | POA: Diagnosis present

## 2019-10-29 DIAGNOSIS — N3941 Urge incontinence: Secondary | ICD-10-CM

## 2019-10-29 DIAGNOSIS — Z87440 Personal history of urinary (tract) infections: Secondary | ICD-10-CM | POA: Diagnosis not present

## 2019-10-29 DIAGNOSIS — N952 Postmenopausal atrophic vaginitis: Secondary | ICD-10-CM

## 2019-10-29 DIAGNOSIS — R3129 Other microscopic hematuria: Secondary | ICD-10-CM

## 2019-10-29 LAB — BLADDER SCAN AMB NON-IMAGING: Scan Result: 0

## 2019-10-29 NOTE — Progress Notes (Signed)
10/29/2019 1:56 PM   Kaitlin [name] 07/30/1934 [id number]  Referring provider: Ellamae Sia, MD [address] [place],  [place] [address]  Chief Complaint  Patient presents with  . Nephrolithiasis  . Urinary Incontinence    HPI: Patient is an 85 year old female with a history of recurrent UTIs, vaginal atrophy, right renal stone and urgency who presents today for a 62-month follow-up with her husband,  Kaitlin [name].    History of recurrent UTIs  Risk factors for UTI's:  Age, Alzheimer's, vaginal atrophy, incontinence, constipation RUS in 02/2017 at [facility] noted bilaterally echogenic kidneys which can be seen in the setting of medical renal disease.  Nonobstructing 0.5 cm calculus in the right lower pole.  She and her husband deny any UTI's over the last year.  Asymptomatic at this visit.  UA nitrite positive, greater than 30 WBCs, 3-10 RBCs and many bacteria.  Right renal stone She has underwent URS and possible PCNL or nephrostomy tube placement in the 90's. RUS in 02/2017 noted non obstructing 0.5 cm calculus in the right lower pole.  She has been diagnosed with primary hyperparathyroidism.  KUB today 4 mm stone seen in the left kidney and 4 mm stone seen in the right kidney.  No flank pain, no passage of fragment or gross hematuria.  UA 3-10 RBC's  Vaginal atrophy Had been on vaginal estrogen cream in the past but was taken off the medication for unknown reasons.  Last Mammogram in 03/2019 was negative.    Urgency The patient is  experiencing urgency x 0-3 (improved), frequency x 8 or more (unchanged), not restricting fluids to avoid visits to the restroom, is engaging in toilet mapping, incontinence x 0-3 (stable) and nocturia x 0-3 (stable).   Her BP is 103/69.   Her PVR is 0 mL.   She wears incontinence pads on rare occasions.  Patient denies any modifying or aggravating factors.  Patient denies any gross hematuria, dysuria or suprapubic/flank pain.  Patient denies any  fevers, chills, nausea or vomiting.   PMH: Past Medical History:  Diagnosis Date  . Cystitis   . Diffuse cystic mastopathy   . Gout   . Hypertension     Surgical History: Past Surgical History:  Procedure Laterality Date  . ABDOMINAL HYSTERECTOMY  1980  . BLADDER STONE REMOVAL  2008  . BREAST LUMPECTOMY Right 1974   positive-no radiation or chemo per pt  . COLONOSCOPY  1994  . LAPAROSCOPY  2013   gallstone removal  . NASAL SINUS SURGERY    . OOPHORECTOMY Bilateral     Home Medications:  Allergies as of 10/29/2019      Reactions   Augmentin [amoxicillin-pot Clavulanate] Nausea And Vomiting, Rash   Tetracyclines & Related Rash      Medication List       Accurate as of October 29, 2019 11:59 PM. If you have any questions, ask your nurse or doctor.        STOP taking these medications   cholecalciferol 400 UNIT/ML Liqd Commonly known as: D-VI-SOL Stopped by: Kaitlin [name]     TAKE these medications   alendronate 40 MG tablet Commonly known as: FOSAMAX Take 40 mg by mouth every 7 (seven) days. Take with a full glass of water on an empty stomach.   amLODipine 5 MG tablet Commonly known as: NORVASC   aspirin 81 MG tablet Take 81 mg by mouth daily.   donepezil 10 MG tablet Commonly known as: ARICEPT Take  10 mg by mouth at bedtime as needed.   loratadine 10 MG tablet Commonly known as: CLARITIN Take 10 mg by mouth daily.   memantine 5 MG tablet Commonly known as: NAMENDA Take 1 tablet by mouth 2 (two) times daily.   pravastatin 40 MG tablet Commonly known as: PRAVACHOL   vitamin E 180 MG (400 UNITS) capsule Generic drug: vitamin E Take 400 Units by mouth daily.       Allergies:  Allergies  Allergen Reactions  . Augmentin [Amoxicillin-Pot Clavulanate] Nausea And Vomiting and Rash  . Tetracyclines & Related Rash    Family History: Family History  Problem Relation Age of Onset  . Breast cancer Neg Hx     Social History:  reports that  she has quit smoking. Her smoking use included cigarettes. She smoked 0.50 packs per day. She has never used smokeless tobacco. She reports that she does not drink alcohol and does not use drugs.  ROS: For pertinent review of systems please refer to history of present illness   Physical Exam: BP 103/69   Pulse 77   Ht 5\' 6"  (1.676 m)   Wt 198 lb (89.8 kg)   BMI 31.96 kg/m   Constitutional:  Well nourished. Alert and oriented, No acute distress. HEENT:  AT, moist mucus membrane.  Trachea midline Cardiovascular: No clubbing, cyanosis, or edema. Respiratory: Normal respiratory effort, no increased work of breathing. Neurologic: Grossly intact, no focal deficits, moving all 4 extremities. Psychiatric: Normal mood and affect.   Laboratory Data: Lab Results  Component Value Date   WBC 12.8 (H) 05/16/2014   HGB 12.9 05/16/2014   HCT 40.7 05/16/2014   MCV 88 05/16/2014   PLT 267 05/16/2014    Lab Results  Component Value Date   CREATININE 0.66 05/16/2014    Lab Results  Component Value Date   HGBA1C 5.7 09/01/2011    Lab Results  Component Value Date   TSH 0.325 (L) 05/16/2014       Component Value Date/Time   CHOL 94 09/01/2011 0340   HDL 4 (L) 09/01/2011 0340   VLDL 33 09/01/2011 0340   LDLCALC 57 09/01/2011 0340    Lab Results  Component Value Date   AST 68 (H) 05/15/2014   Lab Results  Component Value Date   ALT 54 05/15/2014    Urinalysis Component     Latest Ref Rng & Units 10/29/2019  Specific Gravity, UA     1.005 - 1.030 1.025  pH, UA     5.0 - 7.5 6.0  Color, UA     Yellow Yellow  Appearance Ur     Clear Cloudy (A)  Leukocytes,UA     Negative 2+ (A)  Protein,UA     Negative/Trace 1+ (A)  Glucose, UA     Negative Negative  Ketones, UA     Negative Negative  RBC, UA     Negative Trace (A)  Bilirubin, UA     Negative Negative  Urobilinogen, Ur     0.2 - 1.0 mg/dL 1.0  Nitrite, UA     Negative Positive (A)  Microscopic  Examination      See below:   Component     Latest Ref Rng & Units 10/29/2019  WBC, UA     0 - 5 /hpf >30 (A)  RBC     0 - 2 /hpf 3-10 (A)  Epithelial Cells (non renal)     0 - 10 /hpf 0-10  Bacteria, UA  None seen/Few Many (A)    I have reviewed the labs.   Pertinent Imaging: Results for Kaitlin [name], Kaitlin [name] (MRN [id number]) as of 10/29/2019 08:50  Ref. Range 10/29/2019 08:44  Scan Result Unknown 0   CLINICAL DATA:  History of kidney stones.  EXAM: ABDOMEN - 1 VIEW  COMPARISON:  Abdomen/pelvis CT 09/01/2011  FINDINGS: Unremarkable bowel gas pattern. 4 mm calcification projecting over the upper pole left kidney suggest renal stone. Right kidney largely obscured by bowel gas without definite stone disease evident. No evidence for radiopaque stone over the expected course of either ureter or the bladder. There are some phleboliths in the inferior pelvis bilaterally. Degenerative changes noted lumbar spine.  IMPRESSION: 4 mm stone upper pole left kidney. No other findings to suggest urinary stone disease.   Electronically Signed   By: Misty Stanley M.D.   On: 10/29/2019 10:45 I have independently reviewed the films.  See HPI.    Assessment & Plan:    1. Microscopic hematuria Discussed with patient and her husband the possible causes of micro heme (infection, nephrolithiasis, renal insufficiency and/or malignancy) as she is asymptomatic at this visit, we discussed her options going forward.  She can either choose to accept the findings of the urine and the risk of not pursuing a microscopic hematuria work-up due to her age and other comorbidities or we can send the urine for culture, treat the bacteria and see if the microscopic hematuria resolves.  I explained that if the microscopic hematuria does not resolve, we will need to pursue a CT urogram and a cystoscopy and I described how both of those are performed.  She is wanting to pursue a microscopic hematuria  work-up at this time, so I will send the urine for culture and treat with a culture appropriate antibiotic once sensitivities are available.  She will then return for recheck on her UA to ensure the micro heme has resolved after completion of the antibiotic.  If the micro heme has not resolved, she would like to pursue the CT urogram and cystoscopy.  2. Urge incontinence Mrs. Remillard is at goal with her behavioral and dietary changes RTC in one year for OAB questionnaire and PVR  3.  Nephrolithiasis KUB demonstrates a left renal stone a right renal stone  RTC in 12 months for KUB Advised to contact our office or seek treatment in the ED if becomes febrile or pain/ vomiting are difficult control in order to arrange for emergent/urgent intervention  4. History of recurrent UTI's Asked to contact the office for symptoms of an UTI  5. Vaginal atrophy Vaginal estrogen cream discontinued for unknown reasons -we will not restart it unless recurrent UTIs become problematic  Return for Pending urine culture results.  These notes generated with voice recognition software. I apologize for typographical errors.  Kaitlin [name]  [facility] [address] [place], [place] [address] [phone]  I spent 30 minutes on the day of the encounter to include pre-visit record review, face-to-face time with the patient, and post-visit ordering of tests.

## 2019-11-02 LAB — URINALYSIS, COMPLETE
Bilirubin, UA: NEGATIVE
Glucose, UA: NEGATIVE
Ketones, UA: NEGATIVE
Nitrite, UA: POSITIVE — AB
Specific Gravity, UA: 1.025 (ref 1.005–1.030)
Urobilinogen, Ur: 1 mg/dL (ref 0.2–1.0)
pH, UA: 6 (ref 5.0–7.5)

## 2019-11-02 LAB — MICROSCOPIC EXAMINATION: WBC, UA: >30 /hpf — AB (ref 0–5)

## 2019-11-04 LAB — CULTURE, URINE COMPREHENSIVE

## 2019-11-05 ENCOUNTER — Telehealth: Payer: Self-pay | Admitting: Family Medicine

## 2019-11-05 DIAGNOSIS — R3129 Other microscopic hematuria: Secondary | ICD-10-CM

## 2019-11-05 NOTE — Telephone Encounter (Signed)
Patient notified and voiced understanding. Cysto appointment made, Ct ordered.

## 2019-11-05 NOTE — Telephone Encounter (Signed)
-----   Message from Nori Riis, PA-C sent at 11/04/2019  5:00 PM EDT ----- Please let Mrs. Statz know that her urine culture was negative, so we need to proceed with the CT Urogram and cystoscopy.

## 2019-12-02 ENCOUNTER — Ambulatory Visit
Admission: RE | Admit: 2019-12-02 | Discharge: 2019-12-02 | Disposition: A | Discharge status: Home / Self Care | Payer: Medicare Other | Source: Ambulatory Visit | Attending: Urology | Admitting: Urology

## 2019-12-02 ENCOUNTER — Other Ambulatory Visit: Payer: Self-pay

## 2019-12-02 DIAGNOSIS — R3129 Other microscopic hematuria: Secondary | ICD-10-CM

## 2019-12-02 LAB — POCT I-STAT CREATININE: Creatinine, Ser: 1 mg/dL (ref 0.44–1.00)

## 2019-12-02 MED ORDER — IOHEXOL 300 MG/ML  SOLN
150.0000 mL | Freq: Once | INTRAMUSCULAR | Status: AC | PRN
  Administered 2019-12-02: 150 mL via INTRAVENOUS

## 2019-12-05 NOTE — Progress Notes (Signed)
   12/07/2019  CC:  Chief Complaint  Patient presents with  . Cysto    HPI: Kaitlin [name] is a 85 y.o. female with a history of recurrent UTIs, vaginal atrophy, right renal stone and urgency presents today for a cysto. Refer to Shannon's note 10/29/2019  -CT hematuria workup on 12/02/2019 showed small bilateral nonobstructive renal calculi.  No significant upper tract abnormalities.   Blood pressure 95/66, pulse 87, height 5\' 6"  (1.676 m), weight 194 lb (88 kg). NED. A&Ox3.   No respiratory distress   Abd soft, NT, ND Atrophic external genitalia with patent urethral meatus  Cystoscopy Procedure Note  Patient identification was confirmed, informed consent was obtained, and patient was prepped using Betadine solution.  Lidocaine jelly was administered per urethral meatus.    Procedure: - Flexible cystoscope introduced, without any difficulty.   - Thorough search of the bladder revealed:    normal urethral meatus    normal urothelium    moderate sediment    minimal erythema    no stones    no ulcers     no tumors    no urethral polyps    no trabeculation  - Ureteral orifices were normal in position and appearance.  Post-Procedure: - Patient tolerated the procedure well  Urinalysis  >30 WBCs  Assessment/ Plan: 1. Microscopic hematuria No significant abnormalities on cystoscopy/CTU  2.  Asymptomatic pyuria Cipro 500 mg post procedure  Follow up in 6 months with Zara Council, PA-C.   Fransico Him, am acting as a scribe for Dr. Nicki Reaper C. Stoioff,  I have reviewed the above documentation for accuracy and completeness, and I agree with the above.   Abbie Sons, MD

## 2019-12-07 ENCOUNTER — Ambulatory Visit (INDEPENDENT_AMBULATORY_CARE_PROVIDER_SITE_OTHER): Payer: Medicare Other | Admitting: Urology

## 2019-12-07 ENCOUNTER — Encounter: Payer: Self-pay | Admitting: Urology

## 2019-12-07 ENCOUNTER — Other Ambulatory Visit: Payer: Self-pay

## 2019-12-07 VITALS — BP 95/66 | HR 87 | Ht 66.0 in | Wt 194.0 lb

## 2019-12-07 DIAGNOSIS — R3129 Other microscopic hematuria: Secondary | ICD-10-CM | POA: Diagnosis not present

## 2019-12-07 LAB — URINALYSIS, COMPLETE
Bilirubin, UA: NEGATIVE
Glucose, UA: NEGATIVE
Ketones, UA: NEGATIVE
Nitrite, UA: POSITIVE — AB
Specific Gravity, UA: 1.02 (ref 1.005–1.030)
Urobilinogen, Ur: 0.2 mg/dL (ref 0.2–1.0)
pH, UA: 6 (ref 5.0–7.5)

## 2019-12-07 LAB — MICROSCOPIC EXAMINATION: WBC, UA: >30 /hpf — AB (ref 0–5)

## 2020-05-10 ENCOUNTER — Other Ambulatory Visit: Payer: Self-pay | Admitting: Internal Medicine

## 2020-05-10 DIAGNOSIS — Z1231 Encounter for screening mammogram for malignant neoplasm of breast: Secondary | ICD-10-CM

## 2020-05-11 ENCOUNTER — Ambulatory Visit
Admission: RE | Admit: 2020-05-11 | Discharge: 2020-05-11 | Disposition: A | Discharge status: Home / Self Care | Payer: Medicare Other | Source: Ambulatory Visit | Attending: Internal Medicine | Admitting: Internal Medicine

## 2020-05-11 ENCOUNTER — Other Ambulatory Visit: Payer: Self-pay

## 2020-05-11 DIAGNOSIS — Z1231 Encounter for screening mammogram for malignant neoplasm of breast: Secondary | ICD-10-CM | POA: Diagnosis not present

## 2020-06-07 NOTE — Progress Notes (Deleted)
06/08/2020 10:04 PM   [place] Dec 27, 1934 [id number]  Referring provider: Ellamae Sia, MD [address] [place],  [place] [address]  No chief complaint on file.  Urological history 1. High risk hematuria - Former smoker - CTU 11/2019 Small bilateral nonobstructive renal calculi. There is prominence of the left renal pelvis and the ureters are both mildly patulous to the ureterovesicular junction without overt hydronephrosis. No evidence of obstructing lesion or filling defect on delayed phase imaging - Cysto 11/2019 NED - UA ***  2. rUTI's - contributing factors of age, Alzheimer's, vaginal atrophy, incontinence and constipation - no recent documented positive urine cultures  3. Nephrolithiasis - CTU 11/2019 small bilateral nonobstructive stones  4. Vaginal atrophy - not applying cream   5. Urgency - managing conservatively  HPI: Kaitlin [name] is a 86 y.o. female who presents today for her 6 month follow up with her husband, Vonna Drafts.   ***     PMH: Past Medical History:  Diagnosis Date  . Cystitis   . Diffuse cystic mastopathy   . Gout   . Hypertension     Surgical History: Past Surgical History:  Procedure Laterality Date  . ABDOMINAL HYSTERECTOMY  1980  . BLADDER STONE REMOVAL  2008  . BREAST EXCISIONAL BIOPSY Right    benign; many years ago  . BREAST LUMPECTOMY Right 1974   positive-no radiation or chemo per pt  . COLONOSCOPY  1994  . LAPAROSCOPY  2013   gallstone removal  . NASAL SINUS SURGERY    . OOPHORECTOMY Bilateral     Home Medications:  Allergies as of 06/08/2020      Reactions   Augmentin [amoxicillin-pot Clavulanate] Nausea And Vomiting, Rash   Tetracyclines & Related Rash      Medication List       Accurate as of June 07, 2020 10:04 PM. If you have any questions, ask your nurse or doctor.        alendronate 40 MG tablet Commonly known as: FOSAMAX Take 40 mg by mouth every 7 (seven) days. Take with a  full glass of water on an empty stomach.   amLODipine 5 MG tablet Commonly known as: NORVASC   aspirin 81 MG tablet Take 81 mg by mouth daily.   donepezil 10 MG tablet Commonly known as: ARICEPT Take 10 mg by mouth at bedtime as needed.   loratadine 10 MG tablet Commonly known as: CLARITIN Take 10 mg by mouth daily.   memantine 5 MG tablet Commonly known as: NAMENDA Take 1 tablet by mouth 2 (two) times daily.   pravastatin 40 MG tablet Commonly known as: PRAVACHOL   vitamin E 180 MG (400 UNITS) capsule Generic drug: vitamin E Take 400 Units by mouth daily.       Allergies:  Allergies  Allergen Reactions  . Augmentin [Amoxicillin-Pot Clavulanate] Nausea And Vomiting and Rash  . Tetracyclines & Related Rash    Family History: Family History  Problem Relation Age of Onset  . Breast cancer Neg Hx     Social History:  reports that she has quit smoking. Her smoking use included cigarettes. She smoked 0.50 packs per day. She has never used smokeless tobacco. She reports that she does not drink alcohol and does not use drugs.  ROS: For pertinent review of systems please refer to history of present illness   Physical Exam: There were no vitals taken for this visit.  Constitutional:  Well nourished. Alert and oriented, No  acute distress. HEENT: [place] AT, moist mucus membranes.  Trachea midline, no masses. Cardiovascular: No clubbing, cyanosis, or edema. Respiratory: Normal respiratory effort, no increased work of breathing. GI: Abdomen is soft, non tender, non distended, no abdominal masses. Liver and spleen not palpable.  No hernias appreciated.  Stool sample for occult testing is not indicated.   GU: No CVA tenderness.  No bladder fullness or masses.  *** external genitalia, *** pubic hair distribution, no lesions.  Normal urethral meatus, no lesions, no prolapse, no discharge.   No urethral masses, tenderness and/or tenderness. No bladder fullness, tenderness or masses.  *** vagina mucosa, *** estrogen effect, no discharge, no lesions, *** pelvic support, *** cystocele and *** rectocele noted.  No cervical motion tenderness.  Uterus is freely mobile and non-fixed.  No adnexal/parametria masses or tenderness noted.  Anus and perineum are without rashes or lesions.   ***  Skin: No rashes, bruises or suspicious lesions. Lymph: No cervical or inguinal adenopathy. Neurologic: Grossly intact, no focal deficits, moving all 4 extremities. Psychiatric: Normal mood and affect.   Laboratory Data: Specimen:  Blood  Ref Range & Units 2 mo ago  Glucose 70 - 110 mg/dL 112High   Sodium 136 - 145 mmol/L 146High   Potassium 3.6 - 5.1 mmol/L 4.4   Chloride 97 - 109 mmol/L 111High   Carbon Dioxide (CO2) 22.0 - 32.0 mmol/L 26.8   Urea Nitrogen (BUN) 7 - 25 mg/dL 23   Creatinine 0.6 - 1.1 mg/dL 1.1   Glomerular Filtration Rate (eGFR), MDRD Estimate >60 mL/min/1.73sq m 47Low   Calcium 8.7 - 10.3 mg/dL 11.4High   AST  8 - 39 U/L 23   ALT  5 - 38 U/L 16   Alk Phos (alkaline Phosphatase) 34 - 104 U/L 66   Albumin 3.5 - 4.8 g/dL 4.3   Bilirubin, Total 0.3 - 1.2 mg/dL 0.9   Protein, Total 6.1 - 7.9 g/dL 6.5   A/G Ratio 1.0 - 5.0 gm/dL 2.0   Resulting Agency  [facility]  Specimen Collected: 04/04/20 2:04 PM Last Resulted: 04/04/20 5:07 PM  Received From: [facility]  Result Received: 05/10/20 3:36 PM      Urinalysis ***   I have reviewed the labs.   Pertinent Imaging: ***   Assessment & Plan:    1. High risk hematuria - work up in 11/2019 NED - UA ***  2. rUTI's - asymptomatic at this time  3.  Nephrolithiasis - asymptomatic at this time  4. Vaginal atrophy - asymptomatic at this time  5. Urgency - at goal  No follow-ups on file.  These notes generated with voice recognition software. I apologize for typographical errors.  Zara Council, PA-C  [facility] [address] [place], [place] [address] [phone]

## 2020-06-08 ENCOUNTER — Encounter: Payer: Self-pay | Admitting: Urology

## 2020-06-08 ENCOUNTER — Ambulatory Visit: Payer: Medicare Other | Admitting: Urology

## 2020-06-08 DIAGNOSIS — R319 Hematuria, unspecified: Secondary | ICD-10-CM

## 2020-06-08 DIAGNOSIS — N200 Calculus of kidney: Secondary | ICD-10-CM

## 2020-06-08 DIAGNOSIS — N390 Urinary tract infection, site not specified: Secondary | ICD-10-CM

## 2020-06-08 DIAGNOSIS — N3941 Urge incontinence: Secondary | ICD-10-CM

## 2020-06-08 DIAGNOSIS — N952 Postmenopausal atrophic vaginitis: Secondary | ICD-10-CM

## 2020-06-22 NOTE — Progress Notes (Signed)
06/23/2020 1:53 PM   Kaitlin [name] March 12, 1935 [id number]  Referring provider: Ellamae Sia, MD [address] [place],  [place] [address]  Chief Complaint  Patient presents with  . Urinary Incontinence   Urological history 1. High risk hematuria - Former smoker - CTU 11/2019 Small bilateral nonobstructive renal calculi. There is prominence of the left renal pelvis and the ureters are both mildly patulous to the ureterovesicular junction without overt hydronephrosis. No evidence of obstructing lesion or filling defect on delayed phase imaging - Cysto 11/2019 NED - UA 3-10 RBC's   2. rUTI's - contributing factors of age, Alzheimer's, vaginal atrophy, incontinence and constipation - no recent documented positive urine cultures  3. Nephrolithiasis - CTU 11/2019 small bilateral nonobstructive stones  4. Vaginal atrophy - not applying cream   5. Urgency - managing conservatively  HPI: Kaitlin [name] is a 86 y.o. female who presents today for her 6 month follow up with her   She states she is having her baseline frequency, urgency and leakage of urine.  She states that she wears a depends daily, but is more for safety.  There are days when the depends is barely wet.  Patient denies any modifying or aggravating factors.  Patient denies any gross hematuria, dysuria or suprapubic/flank pain.  Patient denies any fevers, chills, nausea or vomiting.   UA is nitrite positive, greater than 30 WBCs, 3-10 RBCs and many bacteria.  PVR 0 mL.  She states she drinks mostly water until 5:00.  At 5:00 she will drink either lemonade, sweet tea or soda.  She then stops all liquids after 7 PM, so she will not have nocturia.  This is effective for her.   PMH: Past Medical History:  Diagnosis Date  . Cystitis   . Diffuse cystic mastopathy   . Gout   . Hypertension     Surgical History: Past Surgical History:  Procedure Laterality Date  . ABDOMINAL HYSTERECTOMY  1980   . BLADDER STONE REMOVAL  2008  . BREAST EXCISIONAL BIOPSY Right    benign; many years ago  . BREAST LUMPECTOMY Right 1974   positive-no radiation or chemo per pt  . COLONOSCOPY  1994  . LAPAROSCOPY  2013   gallstone removal  . NASAL SINUS SURGERY    . OOPHORECTOMY Bilateral     Home Medications:  Allergies as of 06/23/2020      Reactions   Augmentin [amoxicillin-pot Clavulanate] Nausea And Vomiting, Rash   Tetracyclines & Related Rash      Medication List       Accurate as of June 23, 2020  1:53 PM. If you have any questions, ask your nurse or doctor.        alendronate 40 MG tablet Commonly known as: FOSAMAX Take 40 mg by mouth every 7 (seven) days. Take with a full glass of water on an empty stomach.   amLODipine 5 MG tablet Commonly known as: NORVASC   aspirin 81 MG tablet Take 81 mg by mouth daily.   donepezil 10 MG tablet Commonly known as: ARICEPT Take 10 mg by mouth at bedtime as needed.   loratadine 10 MG tablet Commonly known as: CLARITIN Take 10 mg by mouth daily.   memantine 5 MG tablet Commonly known as: NAMENDA Take 1 tablet by mouth 2 (two) times daily.   pravastatin 40 MG tablet Commonly known as: PRAVACHOL   vitamin E 180 MG (400 UNITS) capsule Take 400 Units by mouth daily.  Allergies:  Allergies  Allergen Reactions  . Augmentin [Amoxicillin-Pot Clavulanate] Nausea And Vomiting and Rash  . Tetracyclines & Related Rash    Family History: Family History  Problem Relation Age of Onset  . Breast cancer Neg Hx     Social History:  reports that she has quit smoking. Her smoking use included cigarettes. She smoked 0.50 packs per day. She has never used smokeless tobacco. She reports that she does not drink alcohol and does not use drugs.  ROS: For pertinent review of systems please refer to history of present illness   Physical Exam: BP 107/70   Pulse 77   Ht '5\' 6"'  (1.676 m)   Wt 167 lb (75.8 kg)   BMI 26.95 kg/m    Constitutional:  Well nourished. Alert and oriented, No acute distress. HEENT: [place] AT, mask in place.  Trachea midline Cardiovascular: No clubbing, cyanosis, or edema. Respiratory: Normal respiratory effort, no increased work of breathing. Neurologic: Grossly intact, no focal deficits, moving all 4 extremities. Psychiatric: Normal mood and affect.   Laboratory Data: Specimen:  Blood  Ref Range & Units 2 mo ago  Glucose 70 - 110 mg/dL 112High   Sodium 136 - 145 mmol/L 146High   Potassium 3.6 - 5.1 mmol/L 4.4   Chloride 97 - 109 mmol/L 111High   Carbon Dioxide (CO2) 22.0 - 32.0 mmol/L 26.8   Urea Nitrogen (BUN) 7 - 25 mg/dL 23   Creatinine 0.6 - 1.1 mg/dL 1.1   Glomerular Filtration Rate (eGFR), MDRD Estimate >60 mL/min/1.73sq m 47Low   Calcium 8.7 - 10.3 mg/dL 11.4High   AST  8 - 39 U/L 23   ALT  5 - 38 U/L 16   Alk Phos (alkaline Phosphatase) 34 - 104 U/L 66   Albumin 3.5 - 4.8 g/dL 4.3   Bilirubin, Total 0.3 - 1.2 mg/dL 0.9   Protein, Total 6.1 - 7.9 g/dL 6.5   A/G Ratio 1.0 - 5.0 gm/dL 2.0   Resulting Agency  [facility]  Specimen Collected: 04/04/20 2:04 PM Last Resulted: 04/04/20 5:07 PM  Received From: [facility]  Result Received: 05/10/20 3:36 PM      Urinalysis Component     Latest Ref Rng & Units 06/23/2020  Specific Gravity, UA     1.005 - 1.030 1.025  pH, UA     5.0 - 7.5 6.0  Color, UA     Yellow Yellow  Appearance Ur     Clear Cloudy (A)  Leukocytes,UA     Negative 1+ (A)  Protein,UA     Negative/Trace 2+ (A)  Glucose, UA     Negative Negative  Ketones, UA     Negative Negative  RBC, UA     Negative 2+ (A)  Bilirubin, UA     Negative Negative  Urobilinogen, Ur     0.2 - 1.0 mg/dL 0.2  Nitrite, UA     Negative Positive (A)  Microscopic Examination      See below:   Component     Latest Ref Rng & Units 06/23/2020  WBC, UA     0 - 5 /hpf >30 (A)  RBC     0 - 2 /hpf 3-10 (A)  Epithelial Cells  (non renal)     0 - 10 /hpf >10 (A)  Casts     None seen /lpf Present (A)  Cast Type     N/A Hyaline casts  Mucus, UA     Not  Estab. Present (A)  Bacteria, UA     None seen/Few Many (A)  I have reviewed the labs.   Pertinent Imaging: Results for LATIKA, KRONICK (MRN [id number]) as of 06/23/2020 13:52  Ref. Range 06/23/2020 13:11  Scan Result Unknown 0 ml     Assessment & Plan:    1. High risk hematuria - work up in 11/2019 NED - UA 3-10 RBC's - likely due to nephrolithiasis  2. rUTI's - asymptomatic at this time -Likely colonized, will send for culture in case she develops UTI symptoms  3.  Nephrolithiasis - bilateral nephrolithiasis on CTU 11/2019 - asymptomatic at this time  4. Vaginal atrophy - asymptomatic at this time  5. Urgency - managing conservatively  - at goal  Return for Pending urine culture results.  These notes generated with voice recognition software. I apologize for typographical errors.  Zara Council, PA-C  [facility] [address] [place], [place] [address] [phone]

## 2020-06-23 ENCOUNTER — Other Ambulatory Visit: Payer: Self-pay

## 2020-06-23 ENCOUNTER — Ambulatory Visit (INDEPENDENT_AMBULATORY_CARE_PROVIDER_SITE_OTHER): Payer: Medicare Other | Admitting: Urology

## 2020-06-23 ENCOUNTER — Encounter: Payer: Self-pay | Admitting: Urology

## 2020-06-23 VITALS — BP 107/70 | HR 77 | Ht 66.0 in | Wt 167.0 lb

## 2020-06-23 DIAGNOSIS — R3129 Other microscopic hematuria: Secondary | ICD-10-CM

## 2020-06-23 DIAGNOSIS — N3941 Urge incontinence: Secondary | ICD-10-CM

## 2020-06-23 DIAGNOSIS — R319 Hematuria, unspecified: Secondary | ICD-10-CM | POA: Diagnosis not present

## 2020-06-23 LAB — URINALYSIS, COMPLETE
Bilirubin, UA: NEGATIVE
Glucose, UA: NEGATIVE
Ketones, UA: NEGATIVE
Nitrite, UA: POSITIVE — AB
Specific Gravity, UA: 1.025 (ref 1.005–1.030)
Urobilinogen, Ur: 0.2 mg/dL (ref 0.2–1.0)
pH, UA: 6 (ref 5.0–7.5)

## 2020-06-23 LAB — MICROSCOPIC EXAMINATION
Epithelial Cells (non renal): >10 /hpf — AB (ref 0–10)
WBC, UA: >30 /hpf — AB (ref 0–5)

## 2020-06-23 LAB — BLADDER SCAN AMB NON-IMAGING: Scan Result: 0

## 2020-06-29 ENCOUNTER — Telehealth: Payer: Self-pay | Admitting: Family Medicine

## 2020-06-29 LAB — CULTURE, URINE COMPREHENSIVE

## 2020-06-29 NOTE — Telephone Encounter (Signed)
-----   Message from Nori Riis, PA-C sent at 06/29/2020  2:14 PM EST ----- Please let Mrs. Pichette know that her urine culture was positive for infection.  If she is experiencing UTI symptoms, we should go ahead and treat.  I would recommend Septra DS, twice daily x 7 days.

## 2020-06-29 NOTE — Telephone Encounter (Signed)
Patient notified and states she is not having any symptoms.

## 2020-07-03 ENCOUNTER — Telehealth: Payer: Self-pay | Admitting: Urology

## 2020-07-03 NOTE — Telephone Encounter (Signed)
Would you schedule Kaitlin [name] for a follow up in 6 months?

## 2020-07-07 ENCOUNTER — Encounter: Payer: Self-pay | Admitting: General Surgery

## 2020-07-07 ENCOUNTER — Other Ambulatory Visit: Payer: Self-pay

## 2020-07-07 ENCOUNTER — Ambulatory Visit: Payer: Self-pay

## 2020-07-07 ENCOUNTER — Ambulatory Visit (INDEPENDENT_AMBULATORY_CARE_PROVIDER_SITE_OTHER): Payer: Medicare Other | Admitting: General Surgery

## 2020-07-07 VITALS — BP 130/74 | HR 54 | Temp 97.9°F | Ht 66.0 in | Wt 168.0 lb

## 2020-07-07 DIAGNOSIS — E210 Primary hyperparathyroidism: Secondary | ICD-10-CM | POA: Diagnosis not present

## 2020-07-07 NOTE — Patient Instructions (Addendum)
We have spoken with you about having surgery to have part of your Parathyroid removed. This will be done at [facility] with Dr Celine Ahr.  You will most likely be out of work 1 week for this surgery.  You will be able to eat anything you would like to following surgery.  You will be on a Calcium supplement following surgery.  Please see the (blue)pre-care form that you have been given today. Our surgery scheduler will call you to look at surgery dates and to go over information.   If you have any questions, please call our office.      Parathyroidectomy  A parathyroidectomy is a surgery to remove one or more parathyroid glands. These glands are in the neck. Each gland is very small, about the size of a pea. Most people have four parathyroid glands. The glands produce parathyroid hormone, which helps to control the level of calcium in the body. You may have a parathyroidectomy if your body produces too much parathyroid hormone (hyperparathyroidism). This usually occurs when one or more of your parathyroid glands becomes enlarged from a type of noncancerous tumor (adenoma). Tell a health care provider about:  Any allergies you have.  All medicines you are taking, including vitamins, herbs, eye drops, creams, and over-the-counter medicines.  Any problems you or family members have had with anesthetic medicines.  Any blood disorders you have.  Any surgeries you have had.  Any medical conditions you have.  Whether you are pregnant or may be pregnant. What are the risks? Generally, this is a safe procedure. However, problems may occur, including:  Bleeding.  Infection.  Allergic reactions to medicines.  Damage to the nerves of your voice box (larynx). This can be temporary or long-term (rare).  Damage to nearby structures and organs, such as the skin (scarring), surrounding blood vessels, and nerves in the neck.  Hoarseness. This usually resolves in 24-48 hours.  A  condition in which your body does not make enough parathyroid hormone (hypoparathyroidism). This is rare.  Difficulty breathing. This is rare. What happens before the procedure? Staying hydrated Follow instructions from your health care provider about hydration, which may include:  Up to 2 hours before the procedure - you may continue to drink clear liquids, such as water, clear fruit juice, black coffee, and plain tea. Medicines Ask your health care provider about:  Changing or stopping your regular medicines. This is especially important if you are taking diabetes medicines or blood thinners.  Taking medicines such as aspirin and ibuprofen. These medicines can thin your blood. Do not take these medicines unless your health care provider tells you to take them.  Taking over-the-counter medicines, vitamins, herbs, and supplements. General instructions  You may be asked to shower with a germ-killing soap.  Plan to have someone take you home from the hospital or clinic.  Plan to have a responsible adult care for you for at least 24 hours after you leave the hospital or clinic. This is important. What happens during the procedure? 1. To lower your risk of infection: ? Your health care team will wash or sanitize their hands. ? Hair may be removed from the surgical area. ? Your skin will be washed with soap. 2. An IV will be inserted into one of your veins. 3. You will be given one or more of the following: ? A medicine to help you relax (sedative). ? A medicine to make you fall asleep (general anesthetic). 4. An incision will be made according  to the type of parathyroidectomy procedure you are having. There are four methods that may be used: ? Open surgery. A single incision will be made in the center of your neck. The incision will be about 2-4 inches long. ? Minimally invasive surgery. A small incision will be made in the side of your neck. This incision will be about 1-2 inches long.  Before the procedure, you might be given an injection of a type of medicine that will help the surgeon to locate the gland. 5. Your health care provider may monitor laryngeal nerve function during the procedure for safety reasons. 6. The gland or glands that are causing problems will be removed. 7. The incisions will be closed using stitches (sutures) or other methods. The sutures will often be hidden under the skin. The procedure may vary among health care providers and hospitals. What happens after the procedure?  Your blood pressure, heart rate, breathing rate, and blood oxygen level will be monitored until the medicines you were given have worn off.  You will be given pain medicine as needed.  Your provider will check your ability to talk and swallow after the procedure.  You will gradually start to drink liquids and have soft foods as tolerated.  Your blood will be tested to check the calcium level in your body.  Do not drive for 24 hours if you were given a sedative during your procedure. Summary  The parathyroid glands are located in the neck and produce parathyroid hormone, which helps to control the level of calcium in the body.  A parathyroidectomy is a surgery to remove one or more parathyroid glands.  You may have a parathyroidectomy if your body produces too much parathyroid hormone (hyperparathyroidism).  There are four surgical methods that may be used for a parathyroidectomy: open, minimally invasive, video-assisted, and endoscopic.  Generally, this is a safe procedure. However, problems may occur, including bleeding, infection, and a hoarse or weak voice. This information is not intended to replace advice given to you by your health care provider. Make sure you discuss any questions you have with your health care provider. Document Revised: 04/12/2017 Document Reviewed: 03/05/2017 Elsevier Patient Education  2020 [employer]

## 2020-07-08 NOTE — Progress Notes (Signed)
Patient ID: Kaitlin [name], female   DOB: 12-Jan-1935, 86 y.o.   MRN: [id number]  Chief Complaint  Patient presents with  . New Patient (Initial Visit)    Hyperparathyroidism     HPI Kaitlin [name] is a 86 y.o. female.   She has been referred by Dr. Gabriel Carina for further evaluation of hyperparathyroidism.  Her calcium has been elevated for a number of years.  Appears to have ranged from about 10.3-10.9, but more recently, it was up to 11.4.  She does have a history of nephrolithiasis.  Bone density studies show osteoporosis with a T score of -4.4 in the distal nondominant forearm in 2019.  This improved slightly to -4.2 in 2021.  The patient has been receiving Prolia injections.  She was on Fosamax in the past.  She has never had a pathologic fracture.  No long bone pain she endorses chronic constipation.  She has never had pancreatitis.  She endorses frequent thirst.  She has difficulty falling asleep, but when she is sleeping, she sleeps soundly without frequent waking through the night.  She does have Alzheimer's disease and therefore has some memory changes, but this has been stable for some time on medications.  She has no family history of thyroid, parathyroid, adrenal, pancreas, or other endocrine neoplasia syndromes.  No jaw tumors.  She does report that her brother had problems with kidney stones.  She denies use of Tums, calcium supplements, or thiazide diuretics.  A nuclear medicine parathyroid scan was performed in 2015 that did not show an obvious parathyroid adenoma.  It did, however, identify a large right-sided thyroid nodule.  This has been biopsied in the past and was benign.     Past Medical History:  Diagnosis Date  . Alzheimer disease ([place])   . Cystitis   . Diffuse cystic mastopathy   . Gout   . Hypertension   . Osteopetrosis     Past Surgical History:  Procedure Laterality Date  . ABDOMINAL HYSTERECTOMY  1980  . BLADDER STONE REMOVAL  2008  . BREAST EXCISIONAL BIOPSY  Right    benign; many years ago  . BREAST LUMPECTOMY Right 1974   positive-no radiation or chemo per pt  . COLONOSCOPY  1994  . LAPAROSCOPY  2013   gallstone removal  . NASAL SINUS SURGERY    . OOPHORECTOMY Bilateral     Family History  Problem Relation Age of Onset  . Kidney Stones Brother   . Breast cancer Neg Hx     Social History Social History   Tobacco Use  . Smoking status: Former Smoker    Packs/day: 0.50    Types: Cigarettes  . Smokeless tobacco: Never Used  Vaping Use  . Vaping Use: Never used  Substance Use Topics  . Alcohol use: No  . Drug use: No    Allergies  Allergen Reactions  . Augmentin [Amoxicillin-Pot Clavulanate] Nausea And Vomiting and Rash  . Tetracyclines & Related Rash    Current Outpatient Medications  Medication Sig Dispense Refill  . alendronate (FOSAMAX) 40 MG tablet Take 40 mg by mouth every 7 (seven) days. Take with a full glass of water on an empty stomach.    Marland Kitchen amLODipine (NORVASC) 5 MG tablet     . aspirin 81 MG tablet Take 81 mg by mouth daily.    Marland Kitchen donepezil (ARICEPT) 10 MG tablet Take 10 mg by mouth at bedtime as needed.    . loratadine (CLARITIN) 10 MG tablet Take 10 mg  by mouth daily.    . memantine (NAMENDA) 5 MG tablet Take 1 tablet by mouth 2 (two) times daily.    . pravastatin (PRAVACHOL) 40 MG tablet     . vitamin E 180 MG (400 UNITS) capsule Take 400 Units by mouth daily.     No current facility-administered medications for this visit.    Review of Systems Review of Systems  Cardiovascular: Positive for palpitations.  Gastrointestinal: Positive for constipation.  Genitourinary: Positive for dysuria.  Neurological:       Memory changes secondary to Alzheimer's disease  All other systems reviewed and are negative. Or as discussed in the history of present illness.  Blood pressure 130/74, pulse (!) 54, temperature 97.9 F (36.6 C), height '5\' 6"'  (1.676 m), weight 168 lb (76.2 kg), SpO2 97 %. Body mass index is  27.12 kg/m. Physical Exam Physical Exam Vitals reviewed.  Constitutional:      General: She is not in acute distress.    Appearance: Normal appearance.  HENT:     Head: Normocephalic and atraumatic.     Nose:     Comments: Covered with a mask    Mouth/Throat:     Comments: Covered with a mask Eyes:     General: No scleral icterus.       Right eye: No discharge.        Left eye: No discharge.  Neck:     Comments: No palpable cervical or supraclavicular lymphadenopathy.  The trachea is midline.  The right lobe of the thyroid is significantly enlarged compared to the left.  The gland moves freely with deglutition. Cardiovascular:     Rate and Rhythm: Regular rhythm. Bradycardia present.     Pulses: Normal pulses.  Pulmonary:     Effort: Pulmonary effort is normal. No respiratory distress.     Breath sounds: Normal breath sounds.  Abdominal:     General: Bowel sounds are normal.     Palpations: Abdomen is soft.     Tenderness: There is no abdominal tenderness.  Genitourinary:    Comments: Deferred Musculoskeletal:        General: No swelling or tenderness.  Skin:    General: Skin is warm and dry.  Neurological:     General: No focal deficit present.     Mental Status: She is alert and oriented to person, place, and time.  Psychiatric:        Mood and Affect: Mood normal.        Behavior: Behavior normal.     Data Reviewed Glucose 112High   95 88 81 96 Load older lab results  Sodium 146High   142 143 143 144 Load older lab results  Potassium 4.4 4.1 4.5 4.2 4.5 Load older lab results  Chloride 111High   109 109 108 106 Load older lab results  Carbon Dioxide (CO2) 26.8 30.3 30.4 31.3 29.6 Load older lab results  Calcium 11.4High   10.9High   10.8High   10.3 10.7High   Load older lab results  Urea Nitrogen (BUN) 23 22 27High   23 23 Load older lab results  Creatinine 1.1 1.0 0.9 0.9 0.9 Load older lab results  Glomerular Filtration Rate (eGFR),  MDRD Estimate 47Low   53Low   60Low   60Low   60Low   Load older lab results  BUN/Crea Ratio - - - - - Load older lab results  Anion Gap w/K - - - - - Load older lab results  AST  23  '14 21 21 16   ' ALT  '16 12 21 19 16   ' Alk Phos (alkaline Phosphatase) 66 66 78 57 63   Albumin 4.3 4.1 3.9 3.9 4.1   Bilirubin, Total 0.9 0.8 0.6 0.6 0.7   Protein, Total 6.5 6.6 6.0Low   5.9Low   6.4   A/G Ratio 2.0 1.6 1.9      These labs show a progressive increase in her calcium levels, with a concurrent decrease in her renal function.  Vitamin D, 25-Hydroxy - [facility] 52.8 - - 56.4 - Load older lab results  Parathyroid Hormone, Intact - [facility] - 52 57      Vitamin D is replete.  PTH is inappropriately normal for the degree of hypercalcemia.  Dr. Gabriel Carina forwarded the results of her bone density studies and these are discussed in the history of present illness.  I am not able to view the imaging, but a head and neck ultrasound was performed in July 2017 and the report is copied here: Indication: Multinodular goiter    Comparison: Neck ultrasounds on 08/04/14 and 07/15/13    Technique: Gray-scale and color Doppler images of the neck were obtained.    Findings:  THYROID:  The right lobe of the thyroid measures 5.94 x 1.72 x 2.96 cm.  The left lobe of the thyroid measures 5.66 x 1.08 x 1.89 cm.  The isthmus measures 0.20 cm in AP depth.    Echotexture of the thyroid is homogeneous.    Within the right lobe, there is nodule in the mid pole measuring 2.78 x  1.77 x 2.22 cm. This nodule is heterogeneous with grade 2 Doppler flow.    Within the left lobe, there are three hypoechoic nodules: 0.53 x 0.29 x  0.37 cm in the mid-upper pole, 0.78 x 0.51 x 0.89 cm in the mid pole  laterally, and 0.66 x 0.47 x 0.80 cm in the mid-lower polemedially.    There are no significantly enlarged lymph nodes in the neck.    Impression:  Homogeneous thyroid gland which is modestly enlarged.  There are multiple  nodules throughout the thyroid gland. This is consistent with a  multinodular goiter.    A dominant nodule is located in the right mid pole measuring up to 2.78 cm  in greatest dimension. The nodule is fairly stable in size in comparison  to a prior ultrasound in 07/2014.   In the left lobe there are three sub-centimeter nodules.   I performed a bedside clinical ultrasound using the GE LOGIQ ultrasound and a 12 MHz linear array transducer.  No discrete parathyroid adenoma was appreciated, but there was a potential left superior candidate, as well as a possible intrathyroidal candidate.  Assessment This is an 86 year old woman with biochemical evidence of primary hyperparathyroidism.  She meets criteria for surgery on several levels, including history of nephrolithiasis, severe osteoporosis, decreased renal function, and an elevation of her calcium level greater than 1 mg/dL above the upper limit of normal.  A prior localizing study was nondiagnostic, likely secondary to the large right-sided thyroid nodule which can often confound nuclear medicine testing.  In office ultrasound was also not particularly revealing, but there was a potential left superior candidate within the tracheoesophageal groove.  I have recommended that Kaitlin [name] undergo parathyroidectomy.  With the lack of localization, we may have to perform a full 4 gland exploration, but we will also use intraoperative PTH monitoring to guide our exploration.The risks of parathyroid surgery were discussed with the  patient, including (but not limited to): bleeding, infection, damage to surrounding structures/tissues, injury (temporary or permanent) to the recurrent laryngeal nerve, hypocalcemia (temporary or permanent), need to take calcium and/or vitamin D supplementation, recurrent hyperparathyroidism, failure to correct hyperparathyroidism, need for additional surgery.  The patient had the opportunity to ask any  questions and these were answered to their satisfaction.  Plan We will work on getting her scheduled for surgery.  We are now back to a full elective schedule in the operating room, therefore I do not anticipate any delay.    Fredirick Maudlin 07/08/2020, 2:48 PM

## 2020-07-11 ENCOUNTER — Telehealth: Payer: Self-pay | Admitting: General Surgery

## 2020-07-11 NOTE — Telephone Encounter (Signed)
Outgoing call is made, spoke with Lalania's friend, Burnard Hawthorne. They have been informed of Pre-Admission date/time, COVID Testing date and Surgery date.  Surgery Date: 08/12/20 Preadmission Testing Date: 08/05/20 (phone 8a-1p) Covid Testing Date: 08/10/20 - patient advised to go to the [facility] ([place]) between 8a-1p  Patient has been made aware to call [phone], between 1-3:00pm the day before surgery, to find out what time to arrive for surgery.

## 2020-08-05 ENCOUNTER — Other Ambulatory Visit: Payer: Self-pay

## 2020-08-05 ENCOUNTER — Other Ambulatory Visit
Admission: RE | Admit: 2020-08-05 | Discharge: 2020-08-05 | Disposition: A | Discharge status: Home / Self Care | Payer: Medicare Other | Source: Ambulatory Visit | Attending: General Surgery | Admitting: General Surgery

## 2020-08-05 NOTE — Patient Instructions (Addendum)
Your procedure is scheduled on: August 12, 2020 FRIDAY Report to the Registration Desk on the 1st floor of the [facility]. To find out your arrival time, please call [phone] between 1PM - 3PM on: August 11, 2020  Thursday   REMEMBER: Instructions that are not followed completely may result in serious medical risk, up to and including death; or upon the discretion of your surgeon and anesthesiologist your surgery may need to be rescheduled.  Do not eat OR DRINK after midnight the night before surgery.  No gum chewing, lozengers or hard candies.  ONE THE MORNING OF SURGERY TAKE THESE MEDICATIONS WITH A SIP OF WATER: AMLODIPINE NAMENDA   One week prior to surgery: Stop Anti-inflammatories (NSAIDS) such as Advil, Aleve, Ibuprofen, Motrin, Naproxen, Naprosyn and  ASPIRIN OR Aspirin based products such as Excedrin, Goodys Powder, BC Powder. Stop ANY OVER THE COUNTER supplements until after surgery.  No Alcohol for 24 hours before or after surgery.  No Smoking including e-cigarettes for 24 hours prior to surgery.  No chewable tobacco products for at least 6 hours prior to surgery.  No nicotine patches on the day of surgery.  Do not use any "recreational" drugs for at least a week prior to your surgery.  Please be advised that the combination of cocaine and anesthesia may have negative outcomes, up to and including death. If you test positive for cocaine, your surgery will be cancelled.  On the morning of surgery brush your teeth with toothpaste and water, you may rinse your mouth with mouthwash if you wish. Do not swallow any toothpaste or mouthwash.  Do not wear jewelry, make-up, hairpins, clips or nail polish.  Do not wear lotions, powders, or perfumes OR DEODORANT   Do not shave body from the neck down 48 hours prior to surgery just in case you cut yourself which could leave a site for infection.  Also, freshly shaved skin may become irritated if using the CHG soap.  Contact  lenses, hearing aids and dentures may not be worn into surgery.  Do not bring valuables to the hospital. [facility] is not responsible for any missing/lost belongings or valuables.   SHOWER DAY OF SURGERY  Notify your doctor if there is any change in your medical condition (cold, fever, infection).  Wear comfortable clothing (specific to your surgery type) to the hospital.  Plan for stool softeners for home use; pain medications have a tendency to cause constipation. You can also help prevent constipation by eating foods high in fiber such as fruits and vegetables and drinking plenty of fluids as your diet allows.  After surgery, you can help prevent lung complications by doing breathing exercises.  Take deep breaths and cough every 1-2 hours. Your doctor may order a device called an Incentive Spirometer to help you take deep breaths. When coughing or sneezing, hold a pillow firmly against your incision with both hands. This is called "splinting." Doing this helps protect your incision. It also decreases belly discomfort.  If you are being admitted to the hospital overnight, [place].  If you are being discharged the day of surgery, you will not be allowed to drive home. You will need a responsible adult (18 years or older) to drive you home and stay with you that night.   Please call the [facility]. at [phone] if you have any questions about these instructions.  Surgery Visitation Policy:  Patients undergoing a surgery or procedure may have  one family member or support person with them as long as that person is not COVID-19 positive or experiencing its symptoms.  That person may remain in the waiting area during the procedure.  Inpatient Visitation:    Visiting hours are 7 a.m. to 8 p.m. Inpatients will be allowed two visitors daily. The visitors may change each day during the patient's stay. No visitors under the age of 46. Any  visitor under the age of 39 must be accompanied by an adult. The visitor must pass COVID-19 screenings, use hand sanitizer when entering and exiting the patient's room and wear a mask at all times, including in the patient's room. Patients must also wear a mask when staff or their visitor are in the room. Masking is required regardless of vaccination status.

## 2020-08-09 ENCOUNTER — Other Ambulatory Visit: Payer: Self-pay

## 2020-08-09 ENCOUNTER — Other Ambulatory Visit
Admission: RE | Admit: 2020-08-09 | Discharge: 2020-08-09 | Disposition: A | Discharge status: Home / Self Care | Payer: Medicare Other | Source: Ambulatory Visit | Attending: General Surgery | Admitting: General Surgery

## 2020-08-09 DIAGNOSIS — Z20822 Contact with and (suspected) exposure to covid-19: Secondary | ICD-10-CM | POA: Diagnosis not present

## 2020-08-09 DIAGNOSIS — Z87891 Personal history of nicotine dependence: Secondary | ICD-10-CM | POA: Diagnosis not present

## 2020-08-09 DIAGNOSIS — Z7982 Long term (current) use of aspirin: Secondary | ICD-10-CM | POA: Diagnosis not present

## 2020-08-09 DIAGNOSIS — Z01812 Encounter for preprocedural laboratory examination: Secondary | ICD-10-CM | POA: Diagnosis present

## 2020-08-09 DIAGNOSIS — Z79899 Other long term (current) drug therapy: Secondary | ICD-10-CM | POA: Diagnosis not present

## 2020-08-09 DIAGNOSIS — M810 Age-related osteoporosis without current pathological fracture: Secondary | ICD-10-CM | POA: Diagnosis not present

## 2020-08-09 DIAGNOSIS — Z880 Allergy status to penicillin: Secondary | ICD-10-CM | POA: Diagnosis not present

## 2020-08-09 DIAGNOSIS — Z7983 Long term (current) use of bisphosphonates: Secondary | ICD-10-CM | POA: Diagnosis not present

## 2020-08-09 DIAGNOSIS — E210 Primary hyperparathyroidism: Secondary | ICD-10-CM | POA: Diagnosis not present

## 2020-08-09 DIAGNOSIS — Z881 Allergy status to other antibiotic agents status: Secondary | ICD-10-CM | POA: Diagnosis not present

## 2020-08-10 ENCOUNTER — Other Ambulatory Visit: Payer: Medicare Other

## 2020-08-10 LAB — SARS CORONAVIRUS 2 (TAT 6-24 HRS): SARS Coronavirus 2: NEGATIVE

## 2020-08-11 MED ORDER — FAMOTIDINE 20 MG PO TABS: 20.0000 mg | ORAL_TABLET | Freq: Once | ORAL | Status: AC

## 2020-08-11 MED ORDER — CHLORHEXIDINE GLUCONATE CLOTH 2 % EX PADS: 6.0000 | MEDICATED_PAD | Freq: Once | CUTANEOUS | Status: DC

## 2020-08-12 ENCOUNTER — Ambulatory Visit: Payer: Medicare Other | Admitting: Anesthesiology

## 2020-08-12 ENCOUNTER — Ambulatory Visit
Admission: RE | Admit: 2020-08-12 | Discharge: 2020-08-12 | Disposition: A | Discharge status: Home / Self Care | Payer: Medicare Other | Attending: General Surgery | Admitting: General Surgery

## 2020-08-12 ENCOUNTER — Encounter
Admission: RE | Disposition: A | Discharge status: Home / Self Care | Payer: Self-pay | Source: Home / Self Care | Attending: General Surgery

## 2020-08-12 ENCOUNTER — Encounter: Payer: Self-pay | Admitting: General Surgery

## 2020-08-12 DIAGNOSIS — Z881 Allergy status to other antibiotic agents status: Secondary | ICD-10-CM | POA: Insufficient documentation

## 2020-08-12 DIAGNOSIS — M810 Age-related osteoporosis without current pathological fracture: Secondary | ICD-10-CM | POA: Insufficient documentation

## 2020-08-12 DIAGNOSIS — E210 Primary hyperparathyroidism: Secondary | ICD-10-CM

## 2020-08-12 DIAGNOSIS — Z880 Allergy status to penicillin: Secondary | ICD-10-CM | POA: Insufficient documentation

## 2020-08-12 DIAGNOSIS — Z79899 Other long term (current) drug therapy: Secondary | ICD-10-CM | POA: Insufficient documentation

## 2020-08-12 DIAGNOSIS — Z7983 Long term (current) use of bisphosphonates: Secondary | ICD-10-CM | POA: Insufficient documentation

## 2020-08-12 DIAGNOSIS — Z7982 Long term (current) use of aspirin: Secondary | ICD-10-CM | POA: Insufficient documentation

## 2020-08-12 DIAGNOSIS — D34 Benign neoplasm of thyroid gland: Secondary | ICD-10-CM

## 2020-08-12 DIAGNOSIS — Z87891 Personal history of nicotine dependence: Secondary | ICD-10-CM | POA: Insufficient documentation

## 2020-08-12 HISTORY — PX: PARATHYROIDECTOMY: SHX19

## 2020-08-12 LAB — PARATHYROID HORMONE, INTRAOP (ARMC ONLY)
Parathyroid Hormone: 124 pg/mL — ABNORMAL HIGH (ref 12–88)
Parathyroid Hormone: 132 pg/mL — ABNORMAL HIGH (ref 12–88)
Parathyroid Hormone: 116 pg/mL — ABNORMAL HIGH (ref 12–88)
Parathyroid Hormone: 63 pg/mL (ref 12–88)
Parathyroid Hormone: 78 pg/mL (ref 12–88)

## 2020-08-12 SURGERY — PARATHYROIDECTOMY
Anesthesia: General

## 2020-08-12 MED ORDER — PROPOFOL 500 MG/50ML IV EMUL
INTRAVENOUS | Status: DC | PRN
  Administered 2020-08-12: 150 ug/kg/min via INTRAVENOUS

## 2020-08-12 MED ORDER — LIDOCAINE HCL (CARDIAC) PF 100 MG/5ML IV SOSY
PREFILLED_SYRINGE | INTRAVENOUS | Status: DC | PRN
  Administered 2020-08-12: 80 mg via INTRATRACHEAL

## 2020-08-12 MED ORDER — PROPOFOL 500 MG/50ML IV EMUL
INTRAVENOUS | Status: AC
  Filled 2020-08-12: qty 50

## 2020-08-12 MED ORDER — LACTATED RINGERS IV SOLN: INTRAVENOUS | Status: DC | PRN

## 2020-08-12 MED ORDER — LIDOCAINE HCL (PF) 2 % IJ SOLN
INTRAMUSCULAR | Status: AC
  Filled 2020-08-12: qty 5

## 2020-08-12 MED ORDER — PROPOFOL 10 MG/ML IV BOLUS
INTRAVENOUS | Status: DC | PRN
  Administered 2020-08-12: 20 mg via INTRAVENOUS
  Administered 2020-08-12: 120 mg via INTRAVENOUS
  Administered 2020-08-12: 30 mg via INTRAVENOUS

## 2020-08-12 MED ORDER — "VISTASEAL 4 ML SINGLE DOSE KIT "
PACK | CUTANEOUS | Status: DC | PRN
  Administered 2020-08-12: 4 mL via TOPICAL

## 2020-08-12 MED ORDER — ONDANSETRON HCL 4 MG/2ML IJ SOLN
INTRAMUSCULAR | Status: AC
  Filled 2020-08-12: qty 2

## 2020-08-12 MED ORDER — EPHEDRINE 5 MG/ML INJ
INTRAVENOUS | Status: AC
  Filled 2020-08-12: qty 10

## 2020-08-12 MED ORDER — TRAMADOL HCL 50 MG PO TABS
50.0000 mg | ORAL_TABLET | Freq: Four times a day (QID) | ORAL | 0 refills | Status: DC | PRN
Start: 2020-08-12 — End: 2020-09-01

## 2020-08-12 MED ORDER — DEXAMETHASONE SODIUM PHOSPHATE 10 MG/ML IJ SOLN
INTRAMUSCULAR | Status: DC | PRN
  Administered 2020-08-12: 10 mg via INTRAVENOUS

## 2020-08-12 MED ORDER — PROPOFOL 10 MG/ML IV BOLUS
INTRAVENOUS | Status: AC
  Filled 2020-08-12: qty 40

## 2020-08-12 MED ORDER — DEXAMETHASONE SODIUM PHOSPHATE 10 MG/ML IJ SOLN
INTRAMUSCULAR | Status: AC
  Filled 2020-08-12: qty 1

## 2020-08-12 MED ORDER — FAMOTIDINE 20 MG PO TABS
ORAL_TABLET | ORAL | Status: AC
  Administered 2020-08-12: 20 mg via ORAL
  Filled 2020-08-12: qty 1

## 2020-08-12 MED ORDER — HEMOSTATIC AGENTS (NO CHARGE) OPTIME
TOPICAL | Status: DC | PRN
  Administered 2020-08-12: 1 via TOPICAL

## 2020-08-12 MED ORDER — CHLORHEXIDINE GLUCONATE 0.12 % MT SOLN
OROMUCOSAL | Status: AC
  Administered 2020-08-12: 15 mL via OROMUCOSAL
  Filled 2020-08-12: qty 15

## 2020-08-12 MED ORDER — CHLORHEXIDINE GLUCONATE 0.12 % MT SOLN: 15.0000 mL | Freq: Once | OROMUCOSAL | Status: AC

## 2020-08-12 MED ORDER — OXYCODONE HCL 5 MG/5ML PO SOLN: 5.0000 mg | Freq: Once | ORAL | Status: DC | PRN

## 2020-08-12 MED ORDER — FENTANYL CITRATE (PF) 100 MCG/2ML IJ SOLN
INTRAMUSCULAR | Status: DC | PRN
  Administered 2020-08-12 (×4): 25 ug via INTRAVENOUS

## 2020-08-12 MED ORDER — IBUPROFEN 800 MG PO TABS: 800.0000 mg | ORAL_TABLET | Freq: Three times a day (TID) | ORAL | 0 refills | Status: DC | PRN

## 2020-08-12 MED ORDER — OXYCODONE HCL 5 MG PO TABS
5.0000 mg | ORAL_TABLET | Freq: Once | ORAL | Status: DC | PRN
Start: 2020-08-12 — End: 2020-08-12

## 2020-08-12 MED ORDER — ONDANSETRON HCL 4 MG/2ML IJ SOLN
INTRAMUSCULAR | Status: DC | PRN
  Administered 2020-08-12: 4 mg via INTRAVENOUS

## 2020-08-12 MED ORDER — LACTATED RINGERS IV SOLN: INTRAVENOUS | Status: DC

## 2020-08-12 MED ORDER — FENTANYL CITRATE (PF) 100 MCG/2ML IJ SOLN
INTRAMUSCULAR | Status: AC
  Filled 2020-08-12: qty 2

## 2020-08-12 MED ORDER — ONDANSETRON HCL 4 MG/2ML IJ SOLN: 4.0000 mg | Freq: Once | INTRAMUSCULAR | Status: DC | PRN

## 2020-08-12 MED ORDER — DEXMEDETOMIDINE (PRECEDEX) IN NS 20 MCG/5ML (4 MCG/ML) IV SYRINGE
PREFILLED_SYRINGE | INTRAVENOUS | Status: DC | PRN
  Administered 2020-08-12 (×4): 4 ug via INTRAVENOUS

## 2020-08-12 MED ORDER — SEVOFLURANE IN SOLN
RESPIRATORY_TRACT | Status: AC
  Filled 2020-08-12: qty 250

## 2020-08-12 MED ORDER — FENTANYL CITRATE (PF) 100 MCG/2ML IJ SOLN
25.0000 ug | INTRAMUSCULAR | Status: DC | PRN
Start: 2020-08-12 — End: 2020-08-12

## 2020-08-12 MED ORDER — LIDOCAINE HCL 4 % EX SOLN
CUTANEOUS | Status: DC | PRN
  Administered 2020-08-12: 2 mL via TOPICAL

## 2020-08-12 MED ORDER — DEXMEDETOMIDINE (PRECEDEX) IN NS 20 MCG/5ML (4 MCG/ML) IV SYRINGE
PREFILLED_SYRINGE | INTRAVENOUS | Status: AC
  Filled 2020-08-12: qty 5

## 2020-08-12 MED ORDER — ROCURONIUM BROMIDE 100 MG/10ML IV SOLN
INTRAVENOUS | Status: DC | PRN
  Administered 2020-08-12: 30 mg via INTRAVENOUS
  Administered 2020-08-12: 10 mg via INTRAVENOUS
  Administered 2020-08-12: 50 mg via INTRAVENOUS

## 2020-08-12 MED ORDER — CALCIUM CARBONATE-VITAMIN D 500-200 MG-UNIT PO TABS: 1.0000 | ORAL_TABLET | Freq: Three times a day (TID) | ORAL | 0 refills | Status: DC

## 2020-08-12 MED ORDER — SODIUM CHLORIDE 0.9 % IV SOLN
INTRAVENOUS | Status: DC | PRN
  Administered 2020-08-12: 50 ug/min via INTRAVENOUS

## 2020-08-12 MED ORDER — CEPACOL 15-2.3 MG MT LOZG: 1.0000 | LOZENGE | OROMUCOSAL | 1 refills | Status: DC | PRN

## 2020-08-12 MED ORDER — EPHEDRINE SULFATE 50 MG/ML IJ SOLN
INTRAMUSCULAR | Status: DC | PRN
  Administered 2020-08-12 (×2): 10 mg via INTRAVENOUS

## 2020-08-12 MED ORDER — ORAL CARE MOUTH RINSE: 15.0000 mL | Freq: Once | OROMUCOSAL | Status: AC

## 2020-08-12 MED ORDER — SUGAMMADEX SODIUM 200 MG/2ML IV SOLN
INTRAVENOUS | Status: DC | PRN
  Administered 2020-08-12: 200 mg via INTRAVENOUS

## 2020-08-12 SURGICAL SUPPLY — 52 items
ADH SKN CLS APL DERMABOND .7 (GAUZE/BANDAGES/DRESSINGS) ×1
BACTOSHIELD CHG 4% 4OZ (MISCELLANEOUS) ×1
BASIN GRAD PLASTIC 32OZ STRL (MISCELLANEOUS) ×2 IMPLANT
BLADE SURG 15 STRL LF DISP TIS (BLADE) ×1 IMPLANT
BLADE SURG 15 STRL SS (BLADE) ×2
CANISTER SUCT 1200ML W/VALVE (MISCELLANEOUS) ×2 IMPLANT
CLIP VESOCCLUDE SM WIDE 6/CT (CLIP) ×4 IMPLANT
CNTNR SPEC 2.5X3XGRAD LEK (MISCELLANEOUS) ×1
CONT SPEC 4OZ STER OR WHT (MISCELLANEOUS) ×1
CONT SPEC 4OZ STRL OR WHT (MISCELLANEOUS) ×1
CONTAINER SPEC 2.5X3XGRAD LEK (MISCELLANEOUS) ×1 IMPLANT
COVER WAND RF STERILE (DRAPES) ×2 IMPLANT
DERMABOND ADVANCED (GAUZE/BANDAGES/DRESSINGS) ×1
DERMABOND ADVANCED .7 DNX12 (GAUZE/BANDAGES/DRESSINGS) ×1 IMPLANT
DRAPE LAPAROTOMY 77X122 PED (DRAPES) ×2 IMPLANT
DRAPE MAG INST 16X20 L/F (DRAPES) ×2 IMPLANT
DRSG TEGADERM 2-3/8X2-3/4 SM (GAUZE/BANDAGES/DRESSINGS) IMPLANT
ELECT CAUTERY BLADE TIP 2.5 (TIP) ×2
ELECT LARYNGEAL DUAL CHAN (ELECTRODE) IMPLANT
ELECT NEEDLE 20X.3 GREEN (MISCELLANEOUS)
ELECT REM PT RETURN 9FT ADLT (ELECTROSURGICAL) ×2
ELECTRODE CAUTERY BLDE TIP 2.5 (TIP) ×1 IMPLANT
ELECTRODE NEEDLE 20X.3 GREEN (MISCELLANEOUS) IMPLANT
ELECTRODE REM PT RTRN 9FT ADLT (ELECTROSURGICAL) ×1 IMPLANT
GAUZE 4X4 16PLY RFD (DISPOSABLE) ×4 IMPLANT
GLOVE SURG ENC MOIS LTX SZ6.5 (GLOVE) ×6 IMPLANT
GLOVE SURG UNDER LTX SZ7 (GLOVE) ×6 IMPLANT
GOWN STRL REUS W/ TWL LRG LVL3 (GOWN DISPOSABLE) ×2 IMPLANT
GOWN STRL REUS W/TWL LRG LVL3 (GOWN DISPOSABLE) ×4
HEMOSTAT SNOW SURGICEL 2X4 (HEMOSTASIS) ×2 IMPLANT
KIT TURNOVER KIT A (KITS) ×2 IMPLANT
LABEL OR SOLS (LABEL) ×2 IMPLANT
MANIFOLD NEPTUNE II (INSTRUMENTS) ×2 IMPLANT
NDL SAFETY ECLIPSE 18X1.5 (NEEDLE) ×4 IMPLANT
NEEDLE HYPO 18GX1.5 SHARP (NEEDLE) ×8
NEEDLE HYPO 25X1 1.5 SAFETY (NEEDLE) ×10 IMPLANT
NS IRRIG 500ML POUR BTL (IV SOLUTION) ×2 IMPLANT
PACK BASIN MINOR ARMC (MISCELLANEOUS) ×2 IMPLANT
PROBE NEUROSIGN BIPOL (MISCELLANEOUS) IMPLANT
PROBE NEUROSIGN BIPOLAR (MISCELLANEOUS)
SCRUB CHG 4% DYNA-HEX 4OZ (MISCELLANEOUS) ×1 IMPLANT
SHEARS HARMONIC 9CM CVD (BLADE) IMPLANT
SPONGE KITTNER 5P (MISCELLANEOUS) ×4 IMPLANT
STRIP CLOSURE SKIN 1/2X4 (GAUZE/BANDAGES/DRESSINGS) ×2 IMPLANT
SUT MNCRL AB 4-0 PS2 18 (SUTURE) IMPLANT
SUT PROLENE 4 0 PS 2 18 (SUTURE) ×2 IMPLANT
SUT SILK 2 0 (SUTURE) ×2
SUT SILK 2-0 18XBRD TIE 12 (SUTURE) ×1 IMPLANT
SUT VIC AB 4-0 RB1 27 (SUTURE) ×2
SUT VIC AB 4-0 RB1 27X BRD (SUTURE) ×1 IMPLANT
SYR 3ML LL SCALE MARK (SYRINGE) ×10 IMPLANT
SYR BULB IRRIG 60ML STRL (SYRINGE) ×2 IMPLANT

## 2020-08-12 NOTE — Anesthesia Postprocedure Evaluation (Signed)
Anesthesia Post Note  Patient: Kaitlin [name]  Procedure(s) Performed: PARATHYROIDECTOMY with RNFA to assist (N/A )  Patient location during evaluation: PACU Anesthesia Type: General Level of consciousness: awake and alert Pain management: pain level controlled Vital Signs Assessment: post-procedure vital signs reviewed and stable Respiratory status: spontaneous breathing, nonlabored ventilation and respiratory function stable Cardiovascular status: blood pressure returned to baseline and stable Postop Assessment: no apparent nausea or vomiting Anesthetic complications: no   No complications documented.   Last Vitals:  Vitals:   08/12/20 1033 08/12/20 1045  BP: 123/66 134/61  Pulse: (!) 54 (!) 57  Resp: 15 20  Temp:  (!) 36.1 C  SpO2: 94% 94%    Last Pain:  Vitals:   08/12/20 1045  TempSrc: Temporal  PainSc: 0-No pain                 Brett Canales Adom Schoeneck

## 2020-08-12 NOTE — Anesthesia Preprocedure Evaluation (Addendum)
Anesthesia Evaluation  Patient identified by MRN, date of birth, ID band Patient awake    Reviewed: Allergy & Precautions, H&P , NPO status , Patient's Chart, lab work & pertinent test results  History of Anesthesia Complications Negative for: history of anesthetic complications  Airway Mallampati: II  TM Distance: >3 FB Neck ROM: full    Dental  (+) Chipped   Pulmonary neg sleep apnea, neg COPD, former smoker,    breath sounds clear to auscultation       Cardiovascular hypertension, (-) angina(-) Past MI and (-) Cardiac Stents (-) dysrhythmias  Rhythm:regular Rate:Normal     Neuro/Psych PSYCHIATRIC DISORDERS Dementia negative neurological ROS     GI/Hepatic negative GI ROS, Neg liver ROS,   Endo/Other  negative endocrine ROS  Renal/GU      Musculoskeletal  (+) Arthritis ,   Abdominal   Peds  Hematology negative hematology ROS (+)   Anesthesia Other Findings Past Medical History: No date: Alzheimer disease ([place]) No date: Cystitis No date: Diffuse cystic mastopathy No date: Gout No date: Hypertension No date: Osteopetrosis  Past Surgical History: 1980: ABDOMINAL HYSTERECTOMY 2008: BLADDER STONE REMOVAL No date: BREAST EXCISIONAL BIOPSY; Right     Comment:  benign; many years ago 1974: BREAST LUMPECTOMY; Right     Comment:  positive-no radiation or chemo per pt 1994: COLONOSCOPY 2013: LAPAROSCOPY     Comment:  gallstone removal No date: NASAL SINUS SURGERY No date: OOPHORECTOMY; Bilateral No date: TONSILLECTOMY  BMI    Body Mass Index: 28.73 kg/m      Reproductive/Obstetrics negative OB ROS                            Anesthesia Physical Anesthesia Plan  ASA: II  Anesthesia Plan: General ETT   Post-op Pain Management:    Induction:   PONV Risk Score and Plan: Ondansetron, Dexamethasone, Treatment may vary due to age or medical condition, Propofol infusion and  TIVA  Airway Management Planned:   Additional Equipment:   Intra-op Plan:   Post-operative Plan:   Informed Consent: I have reviewed the patients History and Physical, chart, labs and discussed the procedure including the risks, benefits and alternatives for the proposed anesthesia with the patient or authorized representative who has indicated his/her understanding and acceptance.     Dental Advisory Given  Plan Discussed with: Anesthesiologist, CRNA and Surgeon  Anesthesia Plan Comments: (Pt makes her own medical decisions.  She lives independently.  Alert and oriented x 3. KR)       Anesthesia Quick Evaluation

## 2020-08-12 NOTE — H&P (Signed)
Chief Complaint  Patient presents with  . New Patient (Initial Visit)    Hyperparathyroidism     HPI Kaitlin [name] is a 86 y.o. female.   She has been referred by Dr. Gabriel Carina for further evaluation of hyperparathyroidism.  Her calcium has been elevated for a number of years.  Appears to have ranged from about 10.3-10.9, but more recently, it was up to 11.4.  She does have a history of nephrolithiasis.  Bone density studies show osteoporosis with a T score of -4.4 in the distal nondominant forearm in 2019.  This improved slightly to -4.2 in 2021.  The patient has been receiving Prolia injections.  She was on Fosamax in the past.  She has never had a pathologic fracture.  No long bone pain she endorses chronic constipation.  She has never had pancreatitis.  She endorses frequent thirst.  She has difficulty falling asleep, but when she is sleeping, she sleeps soundly without frequent waking through the night.  She does have Alzheimer's disease and therefore has some memory changes, but this has been stable for some time on medications.  She has no family history of thyroid, parathyroid, adrenal, pancreas, or other endocrine neoplasia syndromes.  No jaw tumors.  She does report that her brother had problems with kidney stones.  She denies use of Tums, calcium supplements, or thiazide diuretics.  A nuclear medicine parathyroid scan was performed in 2015 that did not show an obvious parathyroid adenoma.  It did, however, identify a large right-sided thyroid nodule.  This has been biopsied in the past and was benign.         Past Medical History:  Diagnosis Date  . Alzheimer disease ([place])   . Cystitis   . Diffuse cystic mastopathy   . Gout   . Hypertension   . Osteopetrosis          Past Surgical History:  Procedure Laterality Date  . ABDOMINAL HYSTERECTOMY  1980  . BLADDER STONE REMOVAL  2008  . BREAST EXCISIONAL BIOPSY Right    benign; many years ago  . BREAST LUMPECTOMY  Right 1974   positive-no radiation or chemo per pt  . COLONOSCOPY  1994  . LAPAROSCOPY  2013   gallstone removal  . NASAL SINUS SURGERY    . OOPHORECTOMY Bilateral          Family History  Problem Relation Age of Onset  . Kidney Stones Brother   . Breast cancer Neg Hx     Social History Social History        Tobacco Use  . Smoking status: Former Smoker    Packs/day: 0.50    Types: Cigarettes  . Smokeless tobacco: Never Used  Vaping Use  . Vaping Use: Never used  Substance Use Topics  . Alcohol use: No  . Drug use: No        Allergies  Allergen Reactions  . Augmentin [Amoxicillin-Pot Clavulanate] Nausea And Vomiting and Rash  . Tetracyclines & Related Rash          Current Outpatient Medications  Medication Sig Dispense Refill  . alendronate (FOSAMAX) 40 MG tablet Take 40 mg by mouth every 7 (seven) days. Take with a full glass of water on an empty stomach.    Marland Kitchen amLODipine (NORVASC) 5 MG tablet     . aspirin 81 MG tablet Take 81 mg by mouth daily.    Marland Kitchen donepezil (ARICEPT) 10 MG tablet Take 10 mg by mouth at bedtime as needed.    Marland Kitchen  loratadine (CLARITIN) 10 MG tablet Take 10 mg by mouth daily.    . memantine (NAMENDA) 5 MG tablet Take 1 tablet by mouth 2 (two) times daily.    . pravastatin (PRAVACHOL) 40 MG tablet     . vitamin E 180 MG (400 UNITS) capsule Take 400 Units by mouth daily.     No current facility-administered medications for this visit.    Review of Systems Review of Systems  Cardiovascular: Positive for palpitations.  Gastrointestinal: Positive for constipation.  Genitourinary: Positive for dysuria.  Neurological:       Memory changes secondary to Alzheimer's disease  All other systems reviewed and are negative. Or as discussed in the history of present illness.  Today's Vitals   08/12/20 0641  BP: 117/75  Pulse: 63  Resp: 12  Temp: 97.8 F (36.6 C)  TempSrc: Temporal  SpO2: 98%  Weight:  80.7 kg  Height: '5\' 6"'  (1.676 m)  PainSc: 0-No pain   Body mass index is 28.73 kg/m.  Physical Exam Physical Exam Vitals reviewed.  Constitutional:      General: She is not in acute distress.    Appearance: Normal appearance.  HENT:     Head: Normocephalic and atraumatic.     Nose:     Comments: Covered with a mask    Mouth/Throat:     Comments: Covered with a mask Eyes:     General: No scleral icterus.       Right eye: No discharge.        Left eye: No discharge.  Neck:     Comments: No palpable cervical or supraclavicular lymphadenopathy.  The trachea is midline.  The right lobe of the thyroid is significantly enlarged compared to the left.  The gland moves freely with deglutition. Cardiovascular:     Rate and Rhythm: Regular rhythm. Bradycardia present.     Pulses: Normal pulses.  Pulmonary:     Effort: Pulmonary effort is normal. No respiratory distress.     Breath sounds: Normal breath sounds.  Abdominal:     General: Bowel sounds are normal.     Palpations: Abdomen is soft.     Tenderness: There is no abdominal tenderness.  Genitourinary:    Comments: Deferred Musculoskeletal:        General: No swelling or tenderness.  Skin:    General: Skin is warm and dry.  Neurological:     General: No focal deficit present.     Mental Status: She is alert and oriented to person, place, and time.  Psychiatric:        Mood and Affect: Mood normal.        Behavior: Behavior normal.     Data Reviewed Glucose 112High   95 88 81 96 Load older lab results  Sodium 146High   142 143 143 144 Load older lab results  Potassium 4.4 4.1 4.5 4.2 4.5 Load older lab results  Chloride 111High   109 109 108 106 Load older lab results  Carbon Dioxide (CO2) 26.8 30.3 30.4 31.3 29.6 Load older lab results  Calcium 11.4High   10.9High   10.8High   10.3 10.7High   Load older lab results  Urea Nitrogen (BUN) 23 22 27High   23 23 Load older lab results  Creatinine  1.1 1.0 0.9 0.9 0.9 Load older lab results  Glomerular Filtration Rate (eGFR), MDRD Estimate 47Low   53Low   60Low   60Low   60Low   Load older lab results  BUN/Crea Ratio -- -- -- -- -- Load older lab results  Anion Gap w/K -- -- -- -- -- Load older lab results  AST  '23 14 21 21 16   ' ALT  '16 12 21 19 16   ' Alk Phos (alkaline Phosphatase) 66 66 78 57 63   Albumin 4.3 4.1 3.9 3.9 4.1   Bilirubin, Total 0.9 0.8 0.6 0.6 0.7   Protein, Total 6.5 6.6 6.0Low   5.9Low   6.4   A/G Ratio 2.0 1.6 1.9      These labs show a progressive increase in her calcium levels, with a concurrent decrease in her renal function.  Vitamin D, 25-Hydroxy - [facility] 52.8 -- -- 56.4 -- Load older lab results  Parathyroid Hormone, Intact - [facility] -- 52 57      Vitamin D is replete.  PTH is inappropriately normal for the degree of hypercalcemia.  Dr. Gabriel Carina forwarded the results of her bone density studies and these are discussed in the history of present illness.  I am not able to view the imaging, but a head and neck ultrasound was performed in July 2017 and the report is copied here: Indication: Multinodular goiter    Comparison: Neck ultrasounds on 08/04/14 and 07/15/13    Technique: Gray-scale and color Doppler images of the neck were obtained.    Findings:  THYROID:  The right lobe of the thyroid measures 5.94 x 1.72 x 2.96 cm.  The left lobe of the thyroid measures 5.66 x 1.08 x 1.89 cm.  The isthmus measures 0.20 cm in AP depth.    Echotexture of the thyroid is homogeneous.    Within the right lobe, there is nodule in the mid pole measuring 2.78 x  1.77 x 2.22 cm. This nodule is heterogeneous with grade 2 Doppler flow.    Within the left lobe, there are three hypoechoic nodules: 0.53 x 0.29 x  0.37 cm in the mid-upper pole, 0.78 x 0.51 x 0.89 cm in the mid pole  laterally, and 0.66 x 0.47 x 0.80 cm in the mid-lower polemedially.    There are no  significantly enlarged lymph nodes in the neck.    Impression:  Homogeneous thyroid gland which is modestly enlarged. There are multiple  nodules throughout the thyroid gland. This is consistent with a  multinodular goiter.    A dominant nodule is located in the right mid pole measuring up to 2.78 cm  in greatest dimension. The nodule is fairly stable in size in comparison  to a prior ultrasound in 07/2014.   In the left lobe there are three sub-centimeter nodules.   I performed a bedside clinical ultrasound using the GE LOGIQ ultrasound and a 12 MHz linear array transducer.  No discrete parathyroid adenoma was appreciated, but there was a potential left superior candidate, as well as a possible intrathyroidal candidate.  Assessment This is an 86 year old woman with biochemical evidence of primary hyperparathyroidism.  She meets criteria for surgery on several levels, including history of nephrolithiasis, severe osteoporosis, decreased renal function, and an elevation of her calcium level greater than 1 mg/dL above the upper limit of normal.  A prior localizing study was nondiagnostic, likely secondary to the large right-sided thyroid nodule which can often confound nuclear medicine testing.  In office ultrasound was also not particularly revealing, but there was a potential left superior candidate within the tracheoesophageal groove.  I have recommended that Kaitlin [name] undergo parathyroidectomy.  With the lack of localization, we may  have to perform a full 4 gland exploration, but we will also use intraoperative PTH monitoring to guide our exploration.The risks of parathyroid surgery were discussed with the patient, including (but not limited to): bleeding, infection, damage to surrounding structures/tissues, injury (temporary or permanent) to the recurrent laryngeal nerve, hypocalcemia (temporary or permanent), need to take calcium and/or vitamin D supplementation, recurrent  hyperparathyroidism, failure to correct hyperparathyroidism, need for additional surgery.  The patient had the opportunity to ask any questions and these were answered to their satisfaction.  Plan We will work on getting her scheduled for surgery.  We are now back to a full elective schedule in the operating room, therefore I do not anticipate any delay.

## 2020-08-12 NOTE — Discharge Instructions (Signed)
AMBULATORY SURGERY  DISCHARGE INSTRUCTIONS   1) The drugs that you were given will stay in your system until tomorrow so for the next 24 hours you should not:  A) Drive an automobile B) Make any legal decisions C) Drink any alcoholic beverage   2) You may resume regular meals tomorrow.  Today it is better to start with liquids and gradually work up to solid foods.  You may eat anything you prefer, but it is better to start with liquids, then soup and crackers, and gradually work up to solid foods.   3) Please notify your doctor immediately if you have any unusual bleeding, trouble breathing, redness and pain at the surgery site, drainage, fever, or pain not relieved by medication.    4) Additional Instructions:        Please contact your physician with any problems or Same Day Surgery at [phone], Monday through Friday 6 am to 4 pm, or [place] [facility] number at [phone].Post-operative Home Care After Thyroid or Parathyroid Surgery  What should I expect after my operation?   Marland Kitchen You will see swelling under the incision and/or bruising under it in a few days. This is usually greatest on the second or third day after the operation. You may also feel the sensation of swelling and/or of firmness that can last for a month or more.    . Neck incisions heal rapidly, within a week or two. You can get them damp after about 24 hours, however do not submerge the incision or allow it to become soaked or saturated with water or sweat for 2 weeks.   . Your scar will be most visible 1-2 months after the operation and gradually fade over the next 6-8 months. As it heals, a scar looks more pink or red than the skin around it.   Dennis Bast may feel a firm 'healing ridge' directly under the incision. This is normal and will soften and go away when healing is complete within 3-6 months.   . All incisions are sensitive to sunlight.  For one year after surgery, you should use sunscreen  when outdoors for long periods to prevent darkening of the scar area.    . We recommend that you not expose the incision to the ultraviolet lights used in tanning booths.    Will my neck hurt?  . Most patients experience very little pain, but you may feel some neck stiffness/soreness in your shoulder, back or neck and tension headache that takes a few days or weeks to go away completely.    . Some patients also notice minor changes in swallowing, which improve over time.   . The skin just above and below your incision will feel numb. This will improve over several months, but some persons may have a longterm decrease in sensation.   .  You may apply cold pack over your incision to improve the pain.    How will I manage my pain at home?  . Take NSAIDS like ibuprofen (Motrin, Advil), naproxen (Naprosyn, Aleve) or acetaminophen (Tylenol) every 6 hours for the first 3-5 days after operation. This will minimize the pain you feel.      ? To prevent Tylenol overdose, do not take a Tylenol doses at the same time as a combination narcotic dose that contains Tylenol, like Vicodin and Percocet. However, You may take them 4-6 hours apart.     . If you have sore/stiff muscles in your back, shoulder or neck, you  may use moist warm heat or heating pad on the affected areas 15-20 minutes at a time several times a day.   . Gently massaging your neck muscles will improve the neck stiffness.    . Do not be afraid to move your neck. Gently flexing and stretching your neck muscles will prevent stiffness.    . Stronger pain medication or narcotic (like Vicodin, Darvocet) for severe pain is rarely needed. If it is, however, DO NOT drive a car or drink alcohol while taking these medications.    . Narcotics also cause constipation. Stool softeners (Colace) and fiber (fruits, bran, vegetables) and extra fluid intake helps. A stimulant laxative (Milk of Magnesia, Senokot) may be needed as well.    Will my voice be  affected?  Your voice may be hoarse or weak at first, because the surgery took place near the voice box, but usually recovers within weeks. Some patients also notice a change in the pitch of their voices that affects singing. Rarely, these changes can be permanent.   Are there any diet restrictions?  No, return to your previous diet and always eat a well balanced diet, low in fat, etc.    How will I care for my incision?  . If you have paper "steri strips" on your incision, leave them in place until they begin to fall off naturally. If they become discolored or messy, you may remove them 7-10 days after your operation.   . If you have a skin glue (Dermabond) closure, you may notice tiny pieces of yellow material on your washcloth. Any sutures (stitches) you may have are dissolvable and do not need to be removed.  . You may shower then gently pat dry your incision.   . Do not apply ointments or powders.   . Avoid using Vitamin E cream or other moisturizers on the incision until after your first follow-up visit.   What new medications might I take home?   ? Calcium supplement:  Your body's calcium levels may fall after a total thyroidectomy or parathyroid operation. We recommend you purchase Os-Cal 500 (one tablet equals 500 mg of elemental calcium) or Citracal (2 tablets equal 630 mg of elemental calcium; the "Petites" version contains 500 mg elemental calcium per 2 tablets). You may be taking 3-6 (or more) tablets per day, depending on your doctor's recommendation. You will need to take calcium at different times to avoid medication interaction. Ask your pharmacists, nurse, or doctor about specific interactions.   ? Thyroid Hormone:  If you have had a thyroid operation, you may be prescribed thyroid hormone replacement, called levothyroxine (Synthroid, Levothroid, etc.). A blood test will be done in 6-8 weeks to ensure the dosage is correct  by your doctor or your surgeon.   ? Vitamin D:  If  your doctor has prescribed a Vitamin D supplement, like Calcitriol (Rocaltrol), try to get it filled at the hospital pharmacy before you leave.  Sometimes regular pharmacies do not stock it and will need to order it in.  When can I go back to normal activities?  Marland Kitchen You may return to work in 5-7 days or sooner if desired. Contact the clinic coordinator if you need employer forms completed.   . You may drive as long as you are not taking any narcotics and your neck stiffness is resolved    Can I resume my previous medications?   . Yes, unless directed not to by your doctor.   . Before discharge, be sure  to review your previous medications with your doctor or inpatient medical team.    When do I call for advice?   - If your temperature > 101.5  - You have trouble talking or breathing  - Your fingers/ hands or face or around your lips becomes numb and tingling. (This may mean your calcium level is low.)  - You have trouble swallowing  - Your incision becomes swollen, red or drainage occurs.      Please start taking 1 tablets of either Os-Cal 500 or Citrical 3 times daily. You may use Tums, instead, but please be sure to get the "ultra" variety.  Calcium can be quite constipating, so be sure to drink at least 64 oz of water or other non-caffeinated beverage daily. You can still drink caffeine in addition to this.  If necessary, you may use over the counter stool softeners or fiber supplements to help with constipation.  If you have any numbness or tingling in your fingertips, around your mouth, or at the tip of your nose, this is a symptom of low calcium.  Please chew 2-3 Tums.  Wait 30 minutes and if your symptoms have not resolved, she will another 2-3 Tums.  If your symptoms persist, please contact our office for further instructions.

## 2020-08-12 NOTE — Transfer of Care (Signed)
Immediate Anesthesia Transfer of Care Note  Patient: Kaitlin [name]  Procedure(s) Performed: PARATHYROIDECTOMY with RNFA to assist (N/A )  Patient Location: PACU  Anesthesia Type:General  Level of Consciousness: drowsy  Airway & Oxygen Therapy: Patient Spontanous Breathing and Patient connected to face mask oxygen  Post-op Assessment: Post -op Vital signs reviewed and stable  Post vital signs: Reviewed and stable  Last Vitals:  Vitals Value Taken Time  BP 108/64 08/12/20 0951  Temp    Pulse 59 08/12/20 0954  Resp 18 08/12/20 0954  SpO2 98 % 08/12/20 0954  Vitals shown include unvalidated device data.  Last Pain:  Vitals:   08/12/20 0641  TempSrc: Temporal  PainSc: 0-No pain         Complications: No complications documented.

## 2020-08-12 NOTE — Anesthesia Procedure Notes (Signed)
Procedure Name: Intubation Date/Time: 08/12/2020 7:37 AM Performed by: Jerrye Noble, CRNA Pre-anesthesia Checklist: Patient identified, Emergency Drugs available, Suction available and Patient being monitored Patient Re-evaluated:Patient Re-evaluated prior to induction Oxygen Delivery Method: Circle system utilized Preoxygenation: Pre-oxygenation with 100% oxygen Induction Type: IV induction Ventilation: Mask ventilation without difficulty Laryngoscope Size: McGraph and 3 Grade View: Grade I Tube type: Oral Number of attempts: 1 Airway Equipment and Method: Stylet,  Oral airway and LTA kit utilized Placement Confirmation: ETT inserted through vocal cords under direct vision,  positive ETCO2 and breath sounds checked- equal and bilateral Secured at: 21 cm Tube secured with: Tape Dental Injury: Teeth and Oropharynx as per pre-operative assessment

## 2020-08-12 NOTE — Op Note (Signed)
Operative Note  Preoperative Diagnosis:  primary hyperparathyroidism  Postoperative Diagnosis:  primary hyperparathyroidism  Operation:  parathyroidectomy (4-gland exploration) with removal of less than 4 glands  Surgeon: Fredirick Maudlin, MD  Assistant: Armida Sans, RNFA  Anesthesia: General endotracheal  Findings: All 4 parathyroid glands were identified.  The right inferior was the most abnormal appearing and was confirmed by frozen section after removal.  Intraoperative PTH levels are as follows: Baseline left: 124 Baseline right: 132 Preexcision: 116 5-minute: 78 10-minute: 63   Indications: Kaitlin [name] is an 86 year old woman with biochemical evidence of primary hyperparathyroidism.  She also has severe osteoporosis and meets criteria for operative intervention.  Preoperatively, localizing studies were not revealing and therefore a 4 gland parathyroid exploration was planned.  The risks of the surgery were discussed with her and she agreed to proceed.  Procedure In Detail: The patient was identified in the preoperative holding area and brought to the operating room where she was placed supine in the OR table.  Bony prominences were padded and bilateral sequential compression devices were placed on the lower extremities.  General endotracheal anesthesia was induced without incident.  The patient was positioned appropriately for the operation.  She was sterilely prepped and draped in standard fashion.  A timeout was performed confirming the patient's identity, the procedure being performed, her allergies, all necessary equipment was available, and that maintenance anesthesia was adequate.  A 5 cm transverse incision was made midway between the sternal notch and thyroid cartilage.  This was carried down through the subcutaneous tissues and platysma using electrocautery.  Subplatysmal flaps were elevated and the strap muscles were divided in the median raphae.  We elevated the  sternohyoid off the sternothyroid on both the left and the right and drew baseline PTH values from each internal jugular vein.  These were sent to the lab.  As there had been a suggestion of a possible left-sided adenoma on an in office ultrasound, we began the exploration on that side.  The strap muscles were elevated off the thyroid tissue and the thyroid was rotated medially.  The middle thyroid vein was divided between silk ties.  At the inferior pole, an enlarged parathyroid gland was appreciated.  We explored cranially and identified a normal-appearing superior parathyroid gland.  Within turned to the right and explored that side as well.  The patient has a large thyroid nodule on that side which made exploration and exposure somewhat difficult.  The inferior parathyroid gland was immediately identified and appeared normal.  Is explored more cranially, I initially thought I appreciated a subcapsular parathyroid gland and began to dissected out.  It came clear that it was simply a thyroid nodule.  There was some bleeding from the thyroid that was addressed with electrocautery and oxidized cellulose.  Just above this location, however, I did identify the normal appearing superior gland.  We turned to the left again and the inferior parathyroid gland was dissected free from the surrounding tissues.  It was elevated on its vascular pedicle which was then divided.  A fragment was sent to the lab for frozen section confirmation.  And the remainder handed off as a specimen.  At the time of removal, a PTH level was drawn.  We irrigated our wound beds and worked on achieving hemostasis.  5 and 10 minutes post excision, additional PTH levels were drawn and sent to the lab.  Valsalva maneuvers from the anesthesia team confirmed no ongoing surgical bleeding. SnoW and Vistaseal were applied for additional  hemostasis.  The strap muscles were closed in the midline with running 4-0 Vicryl.  The platysma was closed with  interrupted Vicryl.  The skin was closed with running subcuticular Prolene.  The skin was cleaned.  Our PTH values returned as discussed under findings and therefore we terminated the procedure.  Dermabond and Steri-Strips were applied.  The Prolene suture was removed.  The patient was awakened, extubated, and taken to the postanesthesia care unit in good condition.  EBL: Less than 5 cc  IVF: See anesthesia record  Specimen(s): Left inferior parathyroid gland to pathology  Complications: none immediately apparent.   Counts: all needles, instruments, and sponges were counted and reported to be correct in number at the end of the case.   I was present for and participated in the entire operation.  Fredirick Maudlin 10:03 AM

## 2020-08-13 ENCOUNTER — Other Ambulatory Visit: Payer: Self-pay

## 2020-08-13 ENCOUNTER — Encounter: Payer: Self-pay | Admitting: Emergency Medicine

## 2020-08-13 ENCOUNTER — Ambulatory Visit
Admission: EM | Admit: 2020-08-13 | Discharge: 2020-08-13 | Disposition: A | Discharge status: Home / Self Care | Payer: Medicare Other | Attending: Sports Medicine | Admitting: Sports Medicine

## 2020-08-13 DIAGNOSIS — T8130XA Disruption of wound, unspecified, initial encounter: Secondary | ICD-10-CM

## 2020-08-13 DIAGNOSIS — Z5189 Encounter for other specified aftercare: Secondary | ICD-10-CM

## 2020-08-13 NOTE — ED Provider Notes (Signed)
[facility]    CSN: [id number] Arrival date & time: 08/13/20  1300      History   Chief Complaint Chief Complaint  Patient presents with  . Wound Check    HPI Kaitlin [name] is a 86 y.o. female.   Patient is a pleasant 86 year old female who presents for evaluation of the above issue.  She is accompanied by her neighbor.  The patient reports that she had surgery yesterday.  Had a parathyroidectomy.  When she got up today the dressings had fallen off.  They called the on-call number and the nurse triage told her to come to the urgent care.  She denies any fever shakes chills.  No nausea vomiting diarrhea.  No significant discharge from the wound.  No bleeding noted by the patient.  Other than some discomfort and bruising around the site she is clinically stable upon arrival.  No red flag signs or symptoms elicited on history.     Past Medical History:  Diagnosis Date  . Alzheimer disease ([employer])   . Cystitis   . Diffuse cystic mastopathy   . Gout   . Hypertension   . Osteopetrosis     Patient Active Problem List   Diagnosis Date Noted  . History of nephrolithiasis 10/28/2018  . Hyperlipidemia 01/02/2017  . Mitral regurgitation 01/02/2017  . Heart palpitations 06/23/2014  . SOB (shortness of breath) on exertion 06/23/2014  . Hyperparathyroidism, primary ([place]) 05/12/2014  . Multiple thyroid nodules 05/12/2014  . Osteoporosis 05/12/2014  . Primary osteoarthritis of left knee 03/23/2014  . Internal derangement of left knee 01/14/2014  . Alzheimer disease ([place]) 09/21/2013  . Atrophic vaginitis 07/23/2013  . Benign neoplasm of colon, unspecified 07/23/2013  . Essential (primary) hypertension 07/23/2013  . Gout 07/23/2013  . Nephrolithiasis 07/23/2013  . Urinary incontinence 07/23/2013  . Hypercalcemia 07/23/2013  . Fibrocystic breast disease 12/18/2012    Past Surgical History:  Procedure Laterality Date  . ABDOMINAL HYSTERECTOMY  1980  . BLADDER  STONE REMOVAL  2008  . BREAST EXCISIONAL BIOPSY Right    benign; many years ago  . BREAST LUMPECTOMY Right 1974   positive-no radiation or chemo per pt  . COLONOSCOPY  1994  . LAPAROSCOPY  2013   gallstone removal  . NASAL SINUS SURGERY    . OOPHORECTOMY Bilateral   . PARATHYROIDECTOMY N/A 08/12/2020   Procedure: PARATHYROIDECTOMY with RNFA to assist;  Surgeon: Fredirick Maudlin, MD;  Location: [facility] ORS;  Service: General;  Laterality: N/A;  . TONSILLECTOMY      OB History    Gravida  2   Para  2   Term      Preterm      AB      Living  2     SAB      IAB      Ectopic      Multiple      Live Births           Obstetric Comments  Menstrual: 14 Age 1st Pregnancy: 26         Home Medications    Prior to Admission medications   Medication Sig Start Date End Date Taking? Authorizing Provider  amLODipine (NORVASC) 5 MG tablet Take 5 mg by mouth daily. 09/04/17  Yes [provider]  aspirin 81 MG tablet Take 81 mg by mouth daily.   Yes [provider]  Benzocaine-Menthol (CEPACOL) 15-2.3 MG LOZG Use as directed 1 lozenge in the mouth or throat  every 2 (two) hours as needed (sore throat). 08/12/20  Yes Fredirick Maudlin, MD  Biotin 1000 MCG tablet Take 1,000 mcg by mouth daily.   Yes [provider]  calcium-vitamin D (OSCAL WITH D) 500-200 MG-UNIT tablet Take 1 tablet by mouth 3 (three) times daily for 21 days. 08/12/20 09/02/20 Yes Fredirick Maudlin, MD  donepezil (ARICEPT) 10 MG tablet Take 10 mg by mouth at bedtime.   Yes [provider]  ibuprofen (ADVIL) 800 MG tablet Take 1 tablet (800 mg total) by mouth every 8 (eight) hours as needed. 08/12/20  Yes Fredirick Maudlin, MD  memantine (NAMENDA) 5 MG tablet Take 5 mg by mouth 2 (two) times daily. 10/20/19  Yes [provider]  Misc Natural Products (OSTEO BI-FLEX ADV TRIPLE ST) TABS Take 1 tablet by mouth in the morning and at bedtime.   Yes [provider]  pravastatin  (PRAVACHOL) 40 MG tablet Take 40 mg by mouth daily. 08/13/17  Yes [provider]  traMADol (ULTRAM) 50 MG tablet Take 1 tablet (50 mg total) by mouth every 6 (six) hours as needed. 08/12/20 08/12/21 Yes Fredirick Maudlin, MD  vitamin B-12 (CYANOCOBALAMIN) 500 MCG tablet Take 500 mcg by mouth daily.   Yes [provider]  vitamin E 180 MG (400 UNITS) capsule Take 400 Units by mouth daily.   Yes [provider]    Family History Family History  Problem Relation Age of Onset  . Kidney Stones Brother   . Dementia Mother   . Aneurysm Mother   . Other Father        unknown medical history  . Breast cancer Neg Hx     Social History Social History   Tobacco Use  . Smoking status: Former Smoker    Packs/day: 0.50    Types: Cigarettes    Quit date: 08/14/1990    Years since quitting: 30.0  . Smokeless tobacco: Never Used  Vaping Use  . Vaping Use: Never used  Substance Use Topics  . Alcohol use: No  . Drug use: No     Allergies   Augmentin [amoxicillin-pot clavulanate], Tetracyclines & related, and Tylenol [acetaminophen]   Review of Systems Review of Systems  Constitutional: Positive for activity change. Negative for chills, diaphoresis, fatigue and fever.  HENT: Negative for congestion, drooling, ear pain, rhinorrhea, sinus pressure and sinus pain.   Eyes: Negative.  Negative for pain.  Respiratory: Negative.  Negative for cough, shortness of breath and wheezing.   Cardiovascular: Negative.  Negative for chest pain and palpitations.  Gastrointestinal: Negative.  Negative for abdominal pain, diarrhea, nausea and vomiting.  Genitourinary: Negative.  Negative for dysuria.  Musculoskeletal: Positive for neck pain. Negative for arthralgias and joint swelling.  Skin: Positive for color change and wound. Negative for pallor and rash.  Neurological: Negative for dizziness, seizures, syncope, speech difficulty, weakness, numbness and headaches.  Hematological:  Negative for adenopathy. Does not bruise/bleed easily.  All other systems reviewed and are negative.    Physical Exam Triage Vital Signs ED Triage Vitals  Enc Vitals Group     BP 08/13/20 1324 124/68     Pulse Rate 08/13/20 1324 73     Resp 08/13/20 1324 18     Temp 08/13/20 1324 98.3 F (36.8 C)     Temp Source 08/13/20 1324 Oral     SpO2 --      Weight 08/13/20 1326 160 lb (72.6 kg)     Height 08/13/20 1326 5\' 6"  (1.676 m)  Head Circumference --      Peak Flow --      Pain Score 08/13/20 1325 8     Pain Loc --      Pain Edu? --      Excl. in [place]? --    No data found.  Updated Vital Signs BP 124/68 (BP Location: Left Arm)   Pulse 73   Temp 98.3 F (36.8 C) (Oral)   Resp 18   Ht 5\' 6"  (1.676 m)   Wt 72.6 kg   BMI 25.82 kg/m   Visual Acuity Right Eye Distance:   Left Eye Distance:   Bilateral Distance:    Right Eye Near:   Left Eye Near:    Bilateral Near:     Physical Exam Vitals and nursing note reviewed.  Constitutional:      General: She is not in acute distress.    Appearance: Normal appearance. She is not ill-appearing, toxic-appearing or diaphoretic.  HENT:     Nose: Nose normal.     Mouth/Throat:     Mouth: Mucous membranes are moist.     Pharynx: No oropharyngeal exudate or posterior oropharyngeal erythema.  Eyes:     Extraocular Movements: Extraocular movements intact.     Pupils: Pupils are equal, round, and reactive to light.  Cardiovascular:     Rate and Rhythm: Normal rate and regular rhythm.     Pulses: Normal pulses.     Heart sounds: Normal heart sounds. No murmur heard. No friction rub. No gallop.   Pulmonary:     Effort: Pulmonary effort is normal. No respiratory distress.     Breath sounds: Normal breath sounds. No stridor. No wheezing, rhonchi or rales.  Musculoskeletal:     Cervical back: Edema present. No erythema or crepitus. Pain with movement present. Decreased range of motion.  Skin:    General: Skin is warm.      Capillary Refill: Capillary refill takes less than 2 seconds.     Findings: Ecchymosis and wound present. No abscess, erythema, signs of injury, lesion or rash.     Comments: Patient has a large surgical opening on the anterior aspect of her neck from her parathyroidectomy surgery.  There is some surrounding soft tissue swelling.  There are some ecchymosis.  There is no active bleeding.  She has 1 Steri-Strip in place that is very loose.  She is holding gauze over the wound prior to me coming into the exam room.  Neurological:     General: No focal deficit present.     Mental Status: She is alert.      UC Treatments / Results  Labs (all labs ordered are listed, but only abnormal results are displayed) Labs Reviewed - No data to display  EKG   Radiology No results found.  Procedures Procedures (including critical care time)  Medications Ordered in UC Medications - No data to display  Initial Impression / Assessment and Plan / UC Course  I have reviewed the triage vital signs and the nursing notes.  Pertinent labs & imaging results that were available during my care of the patient were reviewed by me and considered in my medical decision making (see chart for details).  Clinical impression: Open wound in the anterior aspect of her neck status post parathyroidectomy.  Her surgery was yesterday.  Unfortunately her surgical dressings have fallen off.  Treatment plan: 1.  The findings and treatment plan were discussed in detail with the patient and her neighbor who drove  her here.  Both parties were in agreement and voiced verbal understanding. 2.  I contacted the on-call physician for her surgeon Dr. Lysle Pearl and he wanted the patient to come directly to his office.  I gave the patient and her neighbor the contact information including his cell phone and the allergist to meet him at the office.  In addition I left the number here of the urgent care if they had any problems to contact us  directly. 3.  I placed Steri-Strips over the wound and put a Tegaderm over that area until the patient could be seen by Dr. Lysle Pearl. 4.  Patient was discharged in stable condition.    Final Clinical Impressions(s) / UC Diagnoses   Final diagnoses:  Visit for wound check  Wound dehiscence     Discharge Instructions     Please go directly to [facility] clinic, you will see Dr. Lysle Pearl who is the on-call physician for general surgery and is covering for Dr. Celine Ahr.  The address is [address] in [place].  Please call Dr. Ines Bloomer cell phone number when you arrive and he will meet you at the front door of [place] clinic.  His cell phone number is [phone].  If you have any problems please call us before 3:30 PM at [phone]    ED Prescriptions    None     PDMP not reviewed this encounter.   Verda Cumins, MD 08/15/20 2059

## 2020-08-13 NOTE — ED Triage Notes (Signed)
Patient in today after having surgery yesterday for a parathyroidectomy. Patient here today after the steri-strips came off and the wound is bleeding and gaping open.

## 2020-08-13 NOTE — Discharge Instructions (Signed)
Please go directly to [facility] clinic, you will see Dr. Lysle Pearl who is the on-call physician for general surgery and is covering for Dr. Celine Ahr.  The address is [address] in [place].  Please call Dr. Ines Bloomer cell phone number when you arrive and he will meet you at the front door of [facility] clinic.  His cell phone number is [phone].  If you have any problems please call us before 3:30 PM at [phone]

## 2020-08-15 ENCOUNTER — Telehealth: Payer: Self-pay | Admitting: General Surgery

## 2020-08-15 LAB — SURGICAL PATHOLOGY

## 2020-08-15 NOTE — Telephone Encounter (Signed)
Spoke with [facility] care giver- and patient is added to scheduled 08/16/20 Dr.Cannon.

## 2020-08-15 NOTE — Telephone Encounter (Signed)
Tye Maryland is calling she is the patients friend, patient ended up going to the urgent care over the weekend due to her neck wound had opened up and saw Dr. Lysle Pearl and Tye Maryland is asking for the patient to be seen and if it would be okay for the patient to wait another day to be seen by Dr. Celine Ahr or if she would be able to come in today. Please call and advise.

## 2020-08-15 NOTE — Progress Notes (Signed)
This RN Spoke with Tye Maryland (patient states this is her close friend) regarding patient having issues with open wound over the course of the weekend s/p parathyroidectomy on Friday.  Tye Maryland states they were seen on Saturday by Dr. Lysle Pearl as per instructions of the urgent care in [facility].  Patient was assessed and wound was redressed with instructions to follow up with Dr. Celine Ahr this AM (Monday 08/15/20) Tye Maryland states that she is calling this morning to have an appointment established asap for wound assessment by Dr. Celine Ahr. Patient was slightly confused about what the plan was and asked this RN to call [facility].

## 2020-08-16 ENCOUNTER — Ambulatory Visit (INDEPENDENT_AMBULATORY_CARE_PROVIDER_SITE_OTHER): Payer: Medicare Other | Admitting: General Surgery

## 2020-08-16 ENCOUNTER — Other Ambulatory Visit: Payer: Self-pay

## 2020-08-16 ENCOUNTER — Encounter: Payer: Self-pay | Admitting: General Surgery

## 2020-08-16 VITALS — BP 151/93 | HR 69 | Temp 98.2°F | Ht 66.0 in | Wt 165.0 lb

## 2020-08-16 DIAGNOSIS — E892 Postprocedural hypoparathyroidism: Secondary | ICD-10-CM

## 2020-08-16 NOTE — Progress Notes (Signed)
Kaitlin [name] is here today for a wound check.  She is an 86 year old woman who had a parathyroidectomy on Friday.  Unfortunately, somehow her dressing fell off and the wound edges separated.  She was seen at urgent care who asked Dr. Lysle Pearl to see her over the weekend.  He applied a dressing and she is here today for my evaluation.  She denies any fevers or chills.  No nausea or vomiting.  She has not experienced any paresthesias.  She has noticed some serous drainage from the site.  She reports a foreign body sensation in her throat and some mild soreness at the incision.  Today's Vitals   08/16/20 0946  BP: (!) 151/93  Pulse: 69  Temp: 98.2 F (36.8 C)  TempSrc: Oral  SpO2: 95%  Weight: 165 lb (74.8 kg)  Height: 5\' 6"  (1.676 m)   Body mass index is 26.63 kg/m. Focused examination of the neck demonstrates that there is a skin edge separation on the right lateral aspect of the wound.  There is a fair amount of periwound bruising.  There is no erythema, induration, or drainage present.  As the skin separation was minimal, I elected to reclose the wound with Dermabond and reapplied Steri-Strips.  I reiterated that these should be left in situ for 10 days to 2 weeks.  She may decrease her calcium from 3 times a day to twice a day, I will see her as scheduled on April 21.  She or her friend Juliann Pulse will contact us if any further questions or concerns arise in the interim.

## 2020-08-16 NOTE — Patient Instructions (Addendum)
We have placed glue over the incision as well as steri strips. You may shower as usual. Please do not scrub over these.   You may begin taking your calcium twice a day. But if you begin to feel tingling then go back to three times a day.  See your next appointment listed below. Please give [place] a call if you have any questions or concerns.

## 2020-09-01 ENCOUNTER — Encounter: Payer: Self-pay | Admitting: General Surgery

## 2020-09-01 ENCOUNTER — Other Ambulatory Visit: Payer: Self-pay

## 2020-09-01 ENCOUNTER — Ambulatory Visit (INDEPENDENT_AMBULATORY_CARE_PROVIDER_SITE_OTHER): Payer: Medicare Other | Admitting: General Surgery

## 2020-09-01 VITALS — BP 115/80 | HR 72 | Temp 98.1°F | Ht 66.0 in | Wt 160.8 lb

## 2020-09-01 DIAGNOSIS — E892 Postprocedural hypoparathyroidism: Secondary | ICD-10-CM

## 2020-09-01 NOTE — Progress Notes (Signed)
Kaitlin [name] is here today for a postoperative visit.  She is an 86 year old woman who underwent parathyroidectomy for primary hyperparathyroidism on August 12, 2020.  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  Intraoperative PTH levels dropped from 132 to 63.  Unfortunately, for unclear reasons, her wound opened a day later.  It was reglued and re- Steri-Stripped.  She is currently taking 1 tablet of calcium carbonate twice a day.  She denies any paresthesias or other symptoms of hypocalcemia.  Her voice and swallowing are normal.  She denies any fevers or chills.  No nausea or vomiting.  No concerns with pain control.  She saw nephrology last week and has follow-up scheduled with Dr. Gabriel Carina coming up.  Today's Vitals   09/01/20 0922  BP: 115/80  Pulse: 72  Temp: 98.1 F (36.7 C)  TempSrc: Oral  SpO2: 98%  Weight: 160 lb 12.8 oz (72.9 kg)  Height: 5\' 6"  (1.676 m)   Body mass index is 25.95 kg/m. Focused examination demonstrates that the Steri-Strips are off at this time.  The incision is well approximated.  There is no erythema, induration, or drainage present.  There is a small amount of scabbing over the incision line.  No healing ridge palpated.  Impression and plan: This is an 86 year old woman status post 4 gland exploration with removal of a single parathyroid adenoma.  She had an appropriate drop of her intraoperative PTH.  I have recommended that she remain on calcium supplementation for bone health, but will defer any further intervention to Dr. Gabriel Carina.  We discussed scar care today, including massage with a topical emollient agent such as vitamin E oil to improve cosmesis, as well as protection from ultraviolet radiation to avoid altered pigmentation.  I will see her on an as-needed basis.

## 2020-09-01 NOTE — Patient Instructions (Addendum)
Please call if you have any questions or concerns. Apply Viatmin E oil or cocoa butter and massage the scar and this will help smooth the scar. Take the 2 calcium tablets daily. Please follow up with Dr.Sollum.  Apply sunscreen on the area if you are out in the sun to prevent sunburn.

## 2021-01-02 NOTE — Progress Notes (Signed)
01/03/2021 11:37 AM   [name] 30-May-1934 [id number]  Referring provider: Ellamae Sia, MD [address] [place],  [place] [address]  Urological history: 1. High risk hematuria - Former smoker - CTU 11/2019 Small bilateral nonobstructive renal calculi. There is prominence of the left renal pelvis and the ureters are both mildly patulous to the ureterovesicular junction without overt hydronephrosis. No evidence of obstructing lesion or filling defect on delayed phase imaging - Cysto 11/2019 NED - UA 3-10 RBC's  2. rUTI's - contributing factors of age, Alzheimer's, vaginal atrophy, incontinence and constipation - one documented positive urine culture over the last year   3. Nephrolithiasis - CTU 11/2019 small bilateral nonobstructive stones  4. Vaginal atrophy - not applying cream   5. Urgency - managing conservatively  Chief Complaint  Patient presents with   Follow-up     HPI: Kaitlin [name] is a 86 y.o. female who presents today for her 6 month follow up.  She continues to experience leakage.  She wears a Kotex pads daily.  One pad will last the whole day.  She typically wears them two days in a row if they are not wet.    Patient denies any modifying or aggravating factors.  Patient denies any gross hematuria, dysuria or suprapubic/flank pain.  Patient denies any fevers, chills, nausea or vomiting.    UA nitrite positive, greater than 30 WBCs, 3-10 RBCs and many bacteria.    PMH: Past Medical History:  Diagnosis Date   Alzheimer disease ([place])    Cystitis    Diffuse cystic mastopathy    Gout    Hypertension    Osteopetrosis     Surgical History: Past Surgical History:  Procedure Laterality Date   ABDOMINAL HYSTERECTOMY  1980   BLADDER STONE REMOVAL  2008   BREAST EXCISIONAL BIOPSY Right    benign; many years ago   BREAST LUMPECTOMY Right 1974   positive-no radiation or chemo per pt   COLONOSCOPY  1994   LAPAROSCOPY  2013    gallstone removal   NASAL SINUS SURGERY     OOPHORECTOMY Bilateral    PARATHYROIDECTOMY N/A 08/12/2020   Procedure: PARATHYROIDECTOMY with RNFA to assist;  Surgeon: Kaitlin Maudlin, MD;  Location: [facility] ORS;  Service: General;  Laterality: N/A;   TONSILLECTOMY      Home Medications:  Allergies as of 01/03/2021       Reactions   Augmentin [amoxicillin-pot Clavulanate] Nausea And Vomiting, Rash   Tetracyclines & Related Rash   Tylenol [acetaminophen] Rash        Medication List        Accurate as of January 03, 2021 11:37 AM. If you have any questions, ask your nurse or doctor.          amLODipine 5 MG tablet Commonly known as: NORVASC Take 5 mg by mouth daily.   aspirin 81 MG tablet Take 81 mg by mouth daily.   Biotin 1000 MCG tablet Take 1,000 mcg by mouth daily.   calcium-vitamin D 500-200 MG-UNIT tablet Commonly known as: OSCAL WITH D Take 1 tablet by mouth 3 (three) times daily for 21 days.   Cepacol 15-2.3 MG Lozg Generic drug: Benzocaine-Menthol Use as directed 1 lozenge in the mouth or throat every 2 (two) hours as needed (sore throat).   Sore Throat 15-3.6 MG Lozg Generic drug: Benzocaine-Menthol SMARTSIG:1 Lozenge(s) By Mouth Every 2 Hours PRN   donepezil 10 MG tablet Commonly known as: ARICEPT Take 10  mg by mouth at bedtime.   GNP Calcium 500 +D3 500-600 MG-UNIT Tabs Generic drug: Calcium Carb-Cholecalciferol Take by mouth.   ibuprofen 800 MG tablet Commonly known as: ADVIL Take 1 tablet (800 mg total) by mouth every 8 (eight) hours as needed.   memantine 5 MG tablet Commonly known as: NAMENDA Take 5 mg by mouth 2 (two) times daily.   memantine 10 MG tablet Commonly known as: NAMENDA Take 10 mg by mouth 2 (two) times daily.   Osteo Bi-Flex Adv Triple St Tabs Take 1 tablet by mouth in the morning and at bedtime.   pravastatin 40 MG tablet Commonly known as: PRAVACHOL Take 40 mg by mouth daily.   vitamin B-12 500 MCG tablet Commonly  known as: CYANOCOBALAMIN Take 500 mcg by mouth daily.   vitamin E 180 MG (400 UNITS) capsule Take 400 Units by mouth daily.        Allergies:  Allergies  Allergen Reactions   Augmentin [Amoxicillin-Pot Clavulanate] Nausea And Vomiting and Rash   Tetracyclines & Related Rash   Tylenol [Acetaminophen] Rash    Family History: Family History  Problem Relation Age of Onset   Kidney Stones Brother    Dementia Mother    Aneurysm Mother    Other Father        unknown medical history   Breast cancer Neg Hx     Social History:  reports that she quit smoking about 30 years ago. Her smoking use included cigarettes. She smoked an average of .5 packs per day. She has never used smokeless tobacco. She reports that she does not drink alcohol and does not use drugs.  ROS: For pertinent review of systems please refer to history of present illness   Physical Exam: BP 104/73   Pulse 69   Ht '5\' 6"'  (1.676 m)   Wt 153 lb (69.4 kg)   BMI 24.69 kg/m   Constitutional:  Well nourished. Alert and oriented, No acute distress. HEENT: [place] AT, mask in place.  Trachea midline Cardiovascular: No clubbing, cyanosis, or edema. Respiratory: Normal respiratory effort, no increased work of breathing. Neurologic: Grossly intact, no focal deficits, moving all 4 extremities. Psychiatric: Normal mood and affect.    Laboratory Data: Glucose 70 - 110 mg/dL 91   Sodium 136 - 145 mmol/L 142   Potassium 3.6 - 5.1 mmol/L 3.9   Chloride 97 - 109 mmol/L 108   Carbon Dioxide (CO2) 22.0 - 32.0 mmol/L 28.4   Urea Nitrogen (BUN) 7 - 25 mg/dL 17   Creatinine 0.6 - 1.1 mg/dL 0.9   Glomerular Filtration Rate (eGFR), MDRD Estimate >60 mL/min/1.73sq m 60 Low    Calcium 8.7 - 10.3 mg/dL 9.9   AST  8 - 39 U/L 17   ALT  5 - 38 U/L 10   Alk Phos (alkaline Phosphatase) 34 - 104 U/L 72   Albumin 3.5 - 4.8 g/dL 4.0   Bilirubin, Total 0.3 - 1.2 mg/dL 0.9   Protein, Total 6.1 - 7.9 g/dL 6.3   A/G Ratio 1.0 - 5.0 gm/dL  1.7   Resulting Agency  [facility]  Specimen Collected: 09/28/20 09:05 Last Resulted: 09/28/20 11:16  Received From: [place]  Result Received: 01/02/21 13:19   Urinalysis Component     Latest Ref Rng & Units 01/03/2021  Specific Gravity, UA     1.005 - 1.030 1.015  pH, UA     5.0 - 7.5 6.0  Color, UA  Yellow Yellow  Appearance Ur     Clear Cloudy (A)  Leukocytes,UA     Negative 3+ (A)  Protein,UA     Negative/Trace Trace (A)  Glucose, UA     Negative Negative  Ketones, UA     Negative Negative  RBC, UA     Negative 1+ (A)  Bilirubin, UA     Negative Negative  Urobilinogen, Ur     0.2 - 1.0 mg/dL 0.2  Nitrite, UA     Negative Positive (A)  Microscopic Examination      See below:   Component     Latest Ref Rng & Units 01/03/2021  WBC, UA     0 - 5 /hpf >30 (A)  RBC     0 - 2 /hpf 3-10 (A)  Epithelial Cells (non renal)     0 - 10 /hpf 0-10  Bacteria, UA     None seen/Few Many (A)  I have reviewed the labs.   Pertinent Imaging: N/A    Assessment & Plan:    1. High risk hematuria - work up in 11/2019 NED - UA 3-10 RBC's - likely due to nephrolithiasis or UTI  2. rUTI's - UA is nitrite positive with pyuria and micro heme   3.  Nephrolithiasis - bilateral nephrolithiasis on CTU 11/2019 - asymptomatic at this time  4. Vaginal atrophy - asymptomatic at this time  5. Urgency - managing conservatively  - at goal  Return for pending urine culture results .  These notes generated with voice recognition software. I apologize for typographical errors.  Zara Council, PA-C  [facility] [address] [place], [place] [address] [phone]

## 2021-01-03 ENCOUNTER — Other Ambulatory Visit: Payer: Self-pay

## 2021-01-03 ENCOUNTER — Ambulatory Visit (INDEPENDENT_AMBULATORY_CARE_PROVIDER_SITE_OTHER): Payer: Medicare Other | Admitting: Urology

## 2021-01-03 ENCOUNTER — Encounter: Payer: Self-pay | Admitting: Urology

## 2021-01-03 VITALS — BP 104/73 | HR 69 | Ht 66.0 in | Wt 153.0 lb

## 2021-01-03 DIAGNOSIS — N3941 Urge incontinence: Secondary | ICD-10-CM | POA: Diagnosis not present

## 2021-01-03 DIAGNOSIS — N200 Calculus of kidney: Secondary | ICD-10-CM

## 2021-01-03 DIAGNOSIS — R3129 Other microscopic hematuria: Secondary | ICD-10-CM | POA: Diagnosis not present

## 2021-01-03 LAB — URINALYSIS, COMPLETE
Bilirubin, UA: NEGATIVE
Glucose, UA: NEGATIVE
Ketones, UA: NEGATIVE
Nitrite, UA: POSITIVE — AB
Specific Gravity, UA: 1.015 (ref 1.005–1.030)
Urobilinogen, Ur: 0.2 mg/dL (ref 0.2–1.0)
pH, UA: 6 (ref 5.0–7.5)

## 2021-01-03 LAB — MICROSCOPIC EXAMINATION: WBC, UA: >30 /hpf — AB (ref 0–5)

## 2021-01-06 LAB — CULTURE, URINE COMPREHENSIVE

## 2021-01-09 ENCOUNTER — Other Ambulatory Visit: Payer: Self-pay | Admitting: *Deleted

## 2021-01-09 MED ORDER — CEFUROXIME AXETIL 500 MG PO TABS: 500.0000 mg | ORAL_TABLET | Freq: Two times a day (BID) | ORAL | 0 refills | Status: AC

## 2021-02-01 NOTE — Progress Notes (Deleted)
New Patient Office Visit  Subjective:  Patient ID: Kaitlin [name], female    DOB: [birth year]  Age: 86 y.o. MRN: [id number]  CC: No chief complaint on file.   HPI Kaitlin [name] presents for new patient visit to establish care.  Introduced to Designer, jewellery role and practice setting.  All questions answered.  Discussed provider/patient relationship and expectations.   Past Medical History:  Diagnosis Date   Alzheimer disease ([place])    Cystitis    Diffuse cystic mastopathy    Gout    Hypertension    Osteopetrosis     Past Surgical History:  Procedure Laterality Date   ABDOMINAL HYSTERECTOMY  1980   BLADDER STONE REMOVAL  2008   BREAST EXCISIONAL BIOPSY Right    benign; many years ago   BREAST LUMPECTOMY Right 1974   positive-no radiation or chemo per pt   COLONOSCOPY  1994   LAPAROSCOPY  2013   gallstone removal   NASAL SINUS SURGERY     OOPHORECTOMY Bilateral    PARATHYROIDECTOMY N/A 08/12/2020   Procedure: PARATHYROIDECTOMY with RNFA to assist;  Surgeon: Fredirick Maudlin, MD;  Location: [facility] ORS;  Service: General;  Laterality: N/A;   TONSILLECTOMY      Family History  Problem Relation Age of Onset   Kidney Stones Brother    Dementia Mother    Aneurysm Mother    Other Father        unknown medical history   Breast cancer Neg Hx     Social History   Socioeconomic History   Marital status: Married    Spouse name: Not on file   Number of children: Not on file   Years of education: Not on file   Highest education level: Not on file  Occupational History   Not on file  Tobacco Use   Smoking status: Former    Packs/day: 0.50    Types: Cigarettes    Quit date: 08/14/1990    Years since quitting: 30.4   Smokeless tobacco: Never  Vaping Use   Vaping Use: Never used  Substance and Sexual Activity   Alcohol use: No   Drug use: No   Sexual activity: Not on file  Other Topics Concern   Not on file  Social History Narrative   Not on file   Social  Determinants of Health   Financial Resource Strain: Not on file  Food Insecurity: Not on file  Transportation Needs: Not on file  Physical Activity: Not on file  Stress: Not on file  Social Connections: Not on file  Intimate Partner Violence: Not on file    ROS Review of Systems  Objective:   Today's Vitals: There were no vitals taken for this visit.  Physical Exam  Assessment & Plan:   Problem List Items Addressed This Visit   None   Outpatient Encounter Medications as of 02/02/2021  Medication Sig   amLODipine (NORVASC) 5 MG tablet Take 5 mg by mouth daily.   aspirin 81 MG tablet Take 81 mg by mouth daily.   Benzocaine-Menthol (CEPACOL) 15-2.3 MG LOZG Use as directed 1 lozenge in the mouth or throat every 2 (two) hours as needed (sore throat).   Biotin 1000 MCG tablet Take 1,000 mcg by mouth daily.   calcium-vitamin D (OSCAL WITH D) 500-200 MG-UNIT tablet Take 1 tablet by mouth 3 (three) times daily for 21 days.   donepezil (ARICEPT) 10 MG tablet Take 10 mg by mouth at bedtime.   GNP CALCIUM  500 +D3 500-600 MG-UNIT TABS Take by mouth.   ibuprofen (ADVIL) 800 MG tablet Take 1 tablet (800 mg total) by mouth every 8 (eight) hours as needed.   memantine (NAMENDA) 10 MG tablet Take 10 mg by mouth 2 (two) times daily.   memantine (NAMENDA) 5 MG tablet Take 5 mg by mouth 2 (two) times daily.   Misc Natural Products (OSTEO BI-FLEX ADV TRIPLE ST) TABS Take 1 tablet by mouth in the morning and at bedtime.   pravastatin (PRAVACHOL) 40 MG tablet Take 40 mg by mouth daily.   SORE THROAT 15-3.6 MG LOZG SMARTSIG:1 Lozenge(s) By Mouth Every 2 Hours PRN   vitamin B-12 (CYANOCOBALAMIN) 500 MCG tablet Take 500 mcg by mouth daily.   vitamin E 180 MG (400 UNITS) capsule Take 400 Units by mouth daily.   No facility-administered encounter medications on file as of 02/02/2021.    Follow-up: No follow-ups on file.   Charyl Dancer, NP

## 2021-02-02 ENCOUNTER — Ambulatory Visit: Payer: Medicare Other | Admitting: Nurse Practitioner

## 2021-02-02 DIAGNOSIS — Z7689 Persons encountering health services in other specified circumstances: Secondary | ICD-10-CM

## 2021-02-08 NOTE — Progress Notes (Signed)
02/27/21 2:24 PM   Kaitlin [name] [birth year] [id number]  Referring provider:  No referring provider defined for this encounter.  Chief Complaint  Patient presents with   Hematuria    Urological history  1. High risk hematuria - Former smoker - CTU 11/2019 Small bilateral nonobstructive renal calculi. There is prominence of the left renal pelvis and the ureters are both mildly patulous to the ureterovesicular junction without overt hydronephrosis. No evidence of obstructing lesion or filling defect on delayed phase imaging - Cysto 11/2019 NED -no reports of gross hematuria - UA 3-10 RBC's   2. rUTI's - contributing factors of age, Alzheimer's, vaginal atrophy, incontinence and constipation - one documented positive urine culture over the last year    3. Nephrolithiasis - CTU 11/2019 small bilateral nonobstructive stones   4. Vaginal atrophy - not applying cream    5. Urgency - managing conservatively   HPI: Kaitlin [name] is a 86 y.o.female who returns today for 1 month follow-up.   Urine today showed nitrite positive, with >30 WBC's, 3-10 RBCs, and many bacteria   She is currently not using any vaginal estrogen cream.   She feels well and has no urinary complaints at this time.    She denies any modifying or aggravating factors.  Patient denies any gross hematuria, dysuria or suprapubic/flank pain.  Patient denies any fevers, chills, nausea or vomiting.    PMH: Past Medical History:  Diagnosis Date   Alzheimer disease ([place])    Cystitis    Diffuse cystic mastopathy    Gout    Hypertension    Osteopetrosis     Surgical History: Past Surgical History:  Procedure Laterality Date   ABDOMINAL HYSTERECTOMY  1980   BLADDER STONE REMOVAL  2008   BREAST EXCISIONAL BIOPSY Right    benign; many years ago   BREAST LUMPECTOMY Right 1974   positive-no radiation or chemo per pt   COLONOSCOPY  1994   LAPAROSCOPY  2013   gallstone removal   NASAL SINUS SURGERY      OOPHORECTOMY Bilateral    PARATHYROIDECTOMY N/A 08/12/2020   Procedure: PARATHYROIDECTOMY with RNFA to assist;  Surgeon: Fredirick Maudlin, MD;  Location: [facility] ORS;  Service: General;  Laterality: N/A;   TONSILLECTOMY      Home Medications:  Allergies as of 02/09/2021       Reactions   Augmentin [amoxicillin-pot Clavulanate] Nausea And Vomiting, Rash   Tetracyclines & Related Rash   Tylenol [acetaminophen] Rash        Medication List        Accurate as of February 09, 2021 11:59 PM. If you have any questions, ask your nurse or doctor.          STOP taking these medications    calcium-vitamin D 500-200 MG-UNIT tablet Commonly known as: OSCAL WITH D Stopped by: Januel Doolan, PA-C   Cepacol 15-2.3 MG Lozg Generic drug: Benzocaine-Menthol Stopped by: Thad Osoria, PA-C   ibuprofen 800 MG tablet Commonly known as: ADVIL Stopped by: Neyra Pettie, PA-C   memantine 10 MG tablet Commonly known as: NAMENDA Stopped by: Stephenie Navejas, PA-C   memantine 5 MG tablet Commonly known as: NAMENDA Stopped by: Lilyauna Miedema, PA-C   Sore Throat 15-3.6 MG Lozg Generic drug: Benzocaine-Menthol Stopped by: Crysten Kaman, PA-C       TAKE these medications    amLODipine 5 MG tablet Commonly known as: NORVASC Take 5 mg by mouth daily.   aspirin 81 MG tablet Take 81 mg  by mouth daily.   Biotin 1000 MCG tablet Take 1,000 mcg by mouth daily.   donepezil 10 MG tablet Commonly known as: ARICEPT Take 10 mg by mouth at bedtime.   GNP Calcium 500 +D3 500-600 MG-UNIT Tabs Generic drug: Calcium Carb-Cholecalciferol Take by mouth.   Osteo Bi-Flex Adv Triple St Tabs Take 1 tablet by mouth in the morning and at bedtime.   pravastatin 40 MG tablet Commonly known as: PRAVACHOL Take 40 mg by mouth daily.   vitamin B-12 500 MCG tablet Commonly known as: CYANOCOBALAMIN Take 500 mcg by mouth daily.   vitamin E 180 MG (400 UNITS) capsule Take 400 Units by mouth  daily.        Allergies:  Allergies  Allergen Reactions   Augmentin [Amoxicillin-Pot Clavulanate] Nausea And Vomiting and Rash   Tetracyclines & Related Rash   Tylenol [Acetaminophen] Rash    Family History: Family History  Problem Relation Age of Onset   Kidney Stones Brother    Dementia Mother    Aneurysm Mother    Other Father        unknown medical history   Breast cancer Neg Hx     Social History:  reports that she quit smoking about 30 years ago. Her smoking use included cigarettes. She smoked an average of .5 packs per day. She has never used smokeless tobacco. She reports that she does not drink alcohol and does not use drugs.   Physical Exam: BP 127/81   Pulse 71   Ht 5\' 6"  (1.676 m)   Wt 170 lb (77.1 kg)   BMI 27.44 kg/m   Constitutional:  Alert and oriented, No acute distress. HEENT: [place] AT, mask in place.  Trachea midline Cardiovascular: No clubbing, cyanosis, or edema. Respiratory: Normal respiratory effort, no increased work of breathing. Neurologic: Grossly intact, no focal deficits, moving all 4 extremities. Psychiatric: Normal mood and affect.  Laboratory Data:  Urinalysis Component     Latest Ref Rng & Units 02/09/2021  Specific Gravity, UA     1.005 - 1.030 1.020  pH, UA     5.0 - 7.5 6.0  Color, UA     Yellow Yellow  Appearance Ur     Clear Cloudy (A)  Leukocytes,UA     Negative 3+ (A)  Protein,UA     Negative/Trace Trace (A)  Glucose, UA     Negative Negative  Ketones, UA     Negative Negative  RBC, UA     Negative 2+ (A)  Bilirubin, UA     Negative Negative  Urobilinogen, Ur     0.2 - 1.0 mg/dL 0.2  Nitrite, UA     Negative Positive (A)  Microscopic Examination      See below:   Component     Latest Ref Rng & Units 02/09/2021  WBC, UA     0 - 5 /hpf >30 (A)  RBC     0 - 2 /hpf 3-10 (A)  Epithelial Cells (non renal)     0 - 10 /hpf 0-10  Bacteria, UA     None seen/Few Many (A)  I have reviewed the labs.    Assessment & Plan:    High risk hematuria  - Work-up in 11/2019 NED  - UA 3-10 RBCs- likely due to colonization/nephrolithiasis  rUTI's  - Urine today is grossly positive; nitrite positive with micro heme  - asymptomatic at this time  - Urine sent for a culture in case she develops symptoms in  the future  - Counseled her in UTI prevention supplements such as cranberry tablets, probiotics, yogurt that has active lactobacillus culture, and d-mannose  - counseled regarding colonization vs UTI -describing symptoms of an UTI and not to treat positive UA's/urine culture with antibiotics unless she was having symptoms of an UTI (gross heme, dysuria, pain or fevers/chills)  Nephrolithiasis  - Bilateral nephrolithiasis on CTU 11/2019  - Asymptomatic at this time   Vaginal atrophy  - managing conservatively    Return in about 1 year (around 02/09/2022) for UA and symptom recheck .  [facility] [address] [place], [place] [address] [phone]  [facility] as a scribe for [facility], PA-C.,have documented all relevant documentation on the behalf of Darian Ace, PA-C,as directed by  [facility], PA-C while in the presence of Baylie Drakes, PA-C.  I have reviewed the above documentation for accuracy and completeness, and I agree with the above.    Zara Council, PA-C

## 2021-02-09 ENCOUNTER — Ambulatory Visit (INDEPENDENT_AMBULATORY_CARE_PROVIDER_SITE_OTHER): Payer: Medicare Other | Admitting: Urology

## 2021-02-09 ENCOUNTER — Other Ambulatory Visit: Payer: Self-pay

## 2021-02-09 VITALS — BP 127/81 | HR 71 | Ht 66.0 in | Wt 170.0 lb

## 2021-02-09 DIAGNOSIS — N200 Calculus of kidney: Secondary | ICD-10-CM

## 2021-02-09 DIAGNOSIS — R3129 Other microscopic hematuria: Secondary | ICD-10-CM | POA: Diagnosis not present

## 2021-02-09 DIAGNOSIS — N952 Postmenopausal atrophic vaginitis: Secondary | ICD-10-CM

## 2021-02-10 LAB — MICROSCOPIC EXAMINATION: WBC, UA: >30 /hpf — AB (ref 0–5)

## 2021-02-10 LAB — URINALYSIS, COMPLETE
Bilirubin, UA: NEGATIVE
Glucose, UA: NEGATIVE
Ketones, UA: NEGATIVE
Nitrite, UA: POSITIVE — AB
Specific Gravity, UA: 1.02 (ref 1.005–1.030)
Urobilinogen, Ur: 0.2 mg/dL (ref 0.2–1.0)
pH, UA: 6 (ref 5.0–7.5)

## 2021-02-17 ENCOUNTER — Encounter: Payer: Self-pay | Admitting: General Surgery

## 2021-02-19 LAB — CULTURE, URINE COMPREHENSIVE

## 2021-02-27 ENCOUNTER — Encounter: Payer: Self-pay | Admitting: Urology

## 2021-03-24 ENCOUNTER — Ambulatory Visit: Payer: Medicare Other | Admitting: Nurse Practitioner

## 2021-03-29 ENCOUNTER — Other Ambulatory Visit: Payer: Self-pay

## 2021-03-29 ENCOUNTER — Encounter: Payer: Self-pay | Admitting: Nurse Practitioner

## 2021-03-29 ENCOUNTER — Ambulatory Visit (INDEPENDENT_AMBULATORY_CARE_PROVIDER_SITE_OTHER): Payer: Medicare Other | Admitting: Nurse Practitioner

## 2021-03-29 VITALS — BP 132/83 | HR 68 | Temp 98.1°F | Ht 65.0 in | Wt 169.2 lb

## 2021-03-29 DIAGNOSIS — E210 Primary hyperparathyroidism: Secondary | ICD-10-CM

## 2021-03-29 DIAGNOSIS — E785 Hyperlipidemia, unspecified: Secondary | ICD-10-CM

## 2021-03-29 DIAGNOSIS — G309 Alzheimer's disease, unspecified: Secondary | ICD-10-CM | POA: Diagnosis not present

## 2021-03-29 DIAGNOSIS — I10 Essential (primary) hypertension: Secondary | ICD-10-CM

## 2021-03-29 DIAGNOSIS — Z23 Encounter for immunization: Secondary | ICD-10-CM | POA: Diagnosis not present

## 2021-03-29 DIAGNOSIS — F0280 Dementia in other diseases classified elsewhere without behavioral disturbance: Secondary | ICD-10-CM

## 2021-03-29 NOTE — Assessment & Plan Note (Signed)
Chronic.  Controlled.  Continue with current medication regimen of Aricept 10mg .  Return to clinic in 3 months for reevaluation.  Call sooner if concerns arise.

## 2021-03-29 NOTE — Assessment & Plan Note (Addendum)
Chronic.  Controlled.  Continue with current medication regimen on Amlodipine 5mg  daily. Return to clinic in 3 months for reevaluation.  Will draw labs at next visit.  Call sooner if concerns arise.

## 2021-03-29 NOTE — Assessment & Plan Note (Signed)
Chronic.  Controlled.  Continue with current medication regimen of Pravastatin 40mg .  Will do labs at next visit. Return to clinic in 3 months for reevaluation.  Call sooner if concerns arise.

## 2021-03-29 NOTE — Progress Notes (Signed)
BP 132/83   Pulse 68   Temp 98.1 F (36.7 C)   Ht 5\' 5"  (1.651 m)   Wt 169 lb 3.2 oz (76.7 kg)   SpO2 95%   BMI 28.16 kg/m    Subjective:    Patient ID: Kaitlin [name], female    DOB: Nov 24, 1934, 86 y.o.   MRN: [id number]  HPI: Kaitlin [name] is a 86 y.o. female  Chief Complaint  Patient presents with   Establish Care   Patient presents to clinic to establish care with new PCP.  Patient was seen at [facility] and the provider whom she was seeing is no longer there.  Patient reports a history of hypertension, hyperparathyroidsm- one glad was removed, osteoporosis,  high cholesterol, Alzheimer's diagnosis at least 10 years ago.    Patient denies a history of:  Diabetes, Thyroid problems, Depression, Anxiety,and Abdominal problems.    Denies HA, CP, SOB, dizziness, palpitations, visual changes, and lower extremity swelling.   Active Ambulatory Problems    Diagnosis Date Noted   Fibrocystic breast disease 12/18/2012   Alzheimer disease ([place]) 09/21/2013   Atrophic vaginitis 07/23/2013   Benign neoplasm of colon, unspecified 07/23/2013   Essential (primary) hypertension 07/23/2013   Gout 07/23/2013   Heart palpitations 06/23/2014   Hyperlipidemia 01/02/2017   Hyperparathyroidism, primary ([place]) 05/12/2014   Mitral regurgitation 01/02/2017   Internal derangement of left knee 01/14/2014   Nephrolithiasis 07/23/2013   Urinary incontinence 07/23/2013   Hypercalcemia 07/23/2013   Multiple thyroid nodules 05/12/2014   Osteoporosis 05/12/2014   Primary osteoarthritis of left knee 03/23/2014   SOB (shortness of breath) on exertion 06/23/2014   History of nephrolithiasis 10/28/2018   S/P parathyroidectomy ([place]) 08/16/2020   Resolved Ambulatory Problems    Diagnosis Date Noted   No Resolved Ambulatory Problems   Past Medical History:  Diagnosis Date   Cystitis    Diffuse cystic mastopathy    Hypertension    Osteopetrosis    Past Surgical History:  Procedure  Laterality Date   BLADDER STONE REMOVAL  2008   BREAST EXCISIONAL BIOPSY Right    benign; many years ago   BREAST LUMPECTOMY Right 1974   positive-no radiation or chemo per pt   COLONOSCOPY  1994   LAPAROSCOPY  2013   gallstone removal   NASAL SINUS SURGERY     OOPHORECTOMY Bilateral    PARATHYROIDECTOMY N/A 08/12/2020   Procedure: PARATHYROIDECTOMY with RNFA to assist;  Surgeon: Fredirick Maudlin, MD;  Location: [facility] ORS;  Service: General;  Laterality: N/A;   TONSILLECTOMY     TOTAL ABDOMINAL HYSTERECTOMY W/ BILATERAL SALPINGOOPHORECTOMY  1980   Family History  Problem Relation Age of Onset   Kidney Stones Brother    Dementia Mother    Aneurysm Mother    Other Father        unknown medical history   Breast cancer Neg Hx      Review of Systems  Eyes:  Negative for visual disturbance.  Respiratory:  Negative for cough, chest tightness and shortness of breath.   Cardiovascular:  Negative for chest pain, palpitations and leg swelling.  Neurological:  Negative for dizziness and headaches.   Per HPI unless specifically indicated above     Objective:    BP 132/83   Pulse 68   Temp 98.1 F (36.7 C)   Ht 5\' 5"  (1.651 m)   Wt 169 lb 3.2 oz (76.7 kg)   SpO2 95%   BMI 28.16 kg/m   Wt  Readings from Last 3 Encounters:  03/29/21 169 lb 3.2 oz (76.7 kg)  02/09/21 170 lb (77.1 kg)  01/03/21 153 lb (69.4 kg)    Physical Exam Vitals and nursing note reviewed.  Constitutional:      General: She is not in acute distress.    Appearance: Normal appearance. She is normal weight. She is not ill-appearing, toxic-appearing or diaphoretic.  HENT:     Head: Normocephalic.     Right Ear: External ear normal.     Left Ear: External ear normal.     Nose: Nose normal.     Mouth/Throat:     Mouth: Mucous membranes are moist.     Pharynx: Oropharynx is clear.  Eyes:     General:        Right eye: No discharge.        Left eye: No discharge.     Extraocular Movements: Extraocular  movements intact.     Conjunctiva/sclera: Conjunctivae normal.     Pupils: Pupils are equal, round, and reactive to light.  Cardiovascular:     Rate and Rhythm: Normal rate and regular rhythm.     Heart sounds: No murmur heard. Pulmonary:     Effort: Pulmonary effort is normal. No respiratory distress.     Breath sounds: Normal breath sounds. No wheezing or rales.  Musculoskeletal:     Cervical back: Normal range of motion and neck supple.  Skin:    General: Skin is warm and dry.     Capillary Refill: Capillary refill takes less than 2 seconds.  Neurological:     General: No focal deficit present.     Mental Status: She is alert and oriented to person, place, and time. Mental status is at baseline.  Psychiatric:        Mood and Affect: Mood normal.        Behavior: Behavior normal.        Thought Content: Thought content normal.        Judgment: Judgment normal.    Results for orders placed or performed in visit on 02/09/21  CULTURE, URINE COMPREHENSIVE   Specimen: Urine   UR  Result Value Ref Range   Urine Culture, Comprehensive Final report    Organism ID, Bacteria Comment   Microscopic Examination   Urine  Result Value Ref Range   WBC, UA >30 (A) 0 - 5 /hpf   RBC 3-10 (A) 0 - 2 /hpf   Epithelial Cells (non renal) 0-10 0 - 10 /hpf   Bacteria, UA Many (A) None seen/Few  Urinalysis, Complete  Result Value Ref Range   Specific Gravity, UA 1.020 1.005 - 1.030   pH, UA 6.0 5.0 - 7.5   Color, UA Yellow Yellow   Appearance Ur Cloudy (A) Clear   Leukocytes,UA 3+ (A) Negative   Protein,UA Trace (A) Negative/Trace   Glucose, UA Negative Negative   Ketones, UA Negative Negative   RBC, UA 2+ (A) Negative   Bilirubin, UA Negative Negative   Urobilinogen, Ur 0.2 0.2 - 1.0 mg/dL   Nitrite, UA Positive (A) Negative   Microscopic Examination See below:       Assessment & Plan:   Problem List Items Addressed This Visit       Cardiovascular and Mediastinum   Essential  (primary) hypertension - Primary    Chronic.  Controlled.  Continue with current medication regimen on Amlodipine 5mg  daily. Return to clinic in 3 months for reevaluation.  Will draw labs at next  visit.  Call sooner if concerns arise.          Endocrine   Hyperparathyroidism, primary ([place])    Chronic.  Controlled.  Had partial parathyroidectomy in April/May of this year.  Followed by Dr. Gabriel Carina at [facility].  Return to clinic in 3 months for reevaluation.  Call sooner if concerns arise.          Nervous and Auditory   Alzheimer disease ([place])    Chronic.  Controlled.  Continue with current medication regimen of Aricept 10mg .  Return to clinic in 3 months for reevaluation.  Call sooner if concerns arise.          Other   Hyperlipidemia    Chronic.  Controlled.  Continue with current medication regimen of Pravastatin 40mg .  Will do labs at next visit. Return to clinic in 3 months for reevaluation.  Call sooner if concerns arise.        Other Visit Diagnoses     Need for influenza vaccination       Relevant Orders   Flu Vaccine QUAD High Dose(Fluad) (Completed)        Follow up plan: Return in about 3 months (around 06/29/2021) for HTN, HLD, DM2 FU.

## 2021-03-29 NOTE — Assessment & Plan Note (Signed)
Chronic.  Controlled.  Had partial parathyroidectomy in April/May of this year.  Followed by Dr. Gabriel Carina at [facility].  Return to clinic in 3 months for reevaluation.  Call sooner if concerns arise.

## 2021-04-18 ENCOUNTER — Other Ambulatory Visit: Payer: Self-pay | Admitting: Nurse Practitioner

## 2021-05-29 ENCOUNTER — Ambulatory Visit (INDEPENDENT_AMBULATORY_CARE_PROVIDER_SITE_OTHER): Payer: Medicare Other | Admitting: *Deleted

## 2021-05-29 DIAGNOSIS — Z0000 Encounter for general adult medical examination without abnormal findings: Secondary | ICD-10-CM

## 2021-05-29 NOTE — Progress Notes (Signed)
Subjective:   [name] is a 87 y.o. female who presents for Medicare Annual (Subsequent) preventive examination.  I connected with  Kaitlin [name] on 05/29/21 by a telephone enabled telemedicine application and verified that I am speaking with the correct person using two identifiers.   I discussed the limitations of evaluation and management by telemedicine. The patient expressed understanding and agreed to proceed.  Patient location: home  Provider location:  Tele-Health  not in office    Review of Systems     Cardiac Risk Factors include: advanced age (>40men, >79 women);hypertension     Objective:    Today's Vitals   There is no height or weight on file to calculate BMI.  Advanced Directives 05/29/2021 08/12/2020 08/05/2020  Does Patient Have a Medical Advance Directive? Yes No No  Type of Advance Directive [place] - -  Does patient want to make changes to medical advance directive? No - Patient declined - -  Copy of [place] in Chart? No - copy requested - -  Would patient like information on creating a medical advance directive? - No - Patient declined No - Patient declined    Current Medications (verified) Outpatient Encounter Medications as of 05/29/2021  Medication Sig   amLODipine (NORVASC) 5 MG tablet Take 5 mg by mouth daily.   aspirin 81 MG tablet Take 81 mg by mouth daily.   Biotin 1000 MCG tablet Take 1,000 mcg by mouth daily.   donepezil (ARICEPT) 10 MG tablet Take 10 mg by mouth at bedtime.   Misc Natural Products (OSTEO BI-FLEX ADV TRIPLE ST) TABS Take 1 tablet by mouth in the morning and at bedtime.   pravastatin (PRAVACHOL) 40 MG tablet Take 40 mg by mouth daily.   vitamin B-12 (CYANOCOBALAMIN) 500 MCG tablet Take 500 mcg by mouth daily.   vitamin E 180 MG (400 UNITS) capsule Take 400 Units by mouth daily.   No facility-administered encounter medications on file as of 05/29/2021.    Allergies  (verified) Augmentin [amoxicillin-pot clavulanate], Tetracyclines & related, and Tylenol [acetaminophen]   History: Past Medical History:  Diagnosis Date   Alzheimer disease ([place])    Cystitis    Diffuse cystic mastopathy    Gout    Hypertension    Osteopetrosis    Past Surgical History:  Procedure Laterality Date   BLADDER STONE REMOVAL  2008   BREAST EXCISIONAL BIOPSY Right    benign; many years ago   BREAST LUMPECTOMY Right 1974   positive-no radiation or chemo per pt   COLONOSCOPY  1994   LAPAROSCOPY  2013   gallstone removal   NASAL SINUS SURGERY     OOPHORECTOMY Bilateral    PARATHYROIDECTOMY N/A 08/12/2020   Procedure: PARATHYROIDECTOMY with RNFA to assist;  Surgeon: Fredirick Maudlin, MD;  Location: [facility] ORS;  Service: General;  Laterality: N/A;   TONSILLECTOMY     TOTAL ABDOMINAL HYSTERECTOMY W/ BILATERAL SALPINGOOPHORECTOMY  1980   Family History  Problem Relation Age of Onset   Kidney Stones Brother    Dementia Mother    Aneurysm Mother    Other Father        unknown medical history   Breast cancer Neg Hx    Social History   Socioeconomic History   Marital status: Widowed    Spouse name: Not on file   Number of children: Not on file   Years of education: Not on file   Highest education level: Not on  file  Occupational History   Not on file  Tobacco Use   Smoking status: Former    Packs/day: 0.50    Types: Cigarettes    Quit date: 08/14/1990    Years since quitting: 30.8   Smokeless tobacco: Never  Vaping Use   Vaping Use: Never used  Substance and Sexual Activity   Alcohol use: No   Drug use: No   Sexual activity: Not Currently  Other Topics Concern   Not on file  Social History Narrative   Not on file   Social Determinants of Health   Financial Resource Strain: Low Risk    Difficulty of Paying Living Expenses: Not hard at all  Food Insecurity: No Food Insecurity   Worried About Charity fundraiser in the Last Year: Never true   [place] in the Last Year: Never true  Transportation Needs: No Transportation Needs   Lack of Transportation (Medical): No   Lack of Transportation (Non-Medical): No  Physical Activity: Inactive   Days of Exercise per Week: 0 days   Minutes of Exercise per Session: 0 min  Stress: No Stress Concern Present   Feeling of Stress : Not at all  Social Connections: Moderately Integrated   Frequency of Communication with Friends and Family: More than three times a week   Frequency of Social Gatherings with Friends and Family: More than three times a week   Attends Religious Services: More than 4 times per year   Active Member of [employer] or Organizations: Yes   Attends Archivist Meetings: More than 4 times per year   Marital Status: Widowed    Tobacco Counseling Counseling given: Not Answered   Clinical Intake:  Pre-visit preparation completed: Yes  Pain : No/denies pain     Nutritional Risks: None Diabetes: No  How often do you need to have someone help you when you read instructions, pamphlets, or other written materials from your doctor or pharmacy?: 1 - Never  Diabetic?  no  Interpreter Needed?: No  Information entered by :: Leroy Kennedy LPN   Activities of Daily Living In your present state of health, do you have any difficulty performing the following activities: 05/29/2021 08/05/2020  Hearing? N N  Vision? N N  Difficulty concentrating or making decisions? Y N  Walking or climbing stairs? N N  Dressing or bathing? N N  Doing errands, shopping? Y Y  Comment - DOES NOT DRIVE  Preparing Food and eating ? N -  Using the Toilet? N -  In the past six months, have you accidently leaked urine? Y -  Do you have problems with loss of bowel control? N -  Managing your Medications? Y -  Managing your Finances? Y -  Housekeeping or managing your Housekeeping? N -  Some recent data might be hidden    Patient Care Team: Jon Billings, NP as PCP - General (Nurse  Practitioner) Christene Lye, MD (General Surgery)  Indicate any recent Medical Services you may have received from other than [facility] providers in the past year (date may be approximate).     Assessment:   This is a routine wellness examination for [place].  Hearing/Vision screen Hearing Screening - Comments:: No trouble hearing Vision Screening - Comments:: Not in  clinic  Dietary issues and exercise activities discussed: Current Exercise Habits: The patient does not participate in regular exercise at present, Exercise limited by: None identified   Goals Addressed  This Visit's Progress    Patient Stated       No goals       Depression Screen PHQ 2/9 Scores 05/29/2021 03/29/2021  PHQ - 2 Score 0 0  PHQ- 9 Score - 0    Fall Risk Fall Risk  05/29/2021 03/29/2021 08/16/2020  Falls in the past year? 0 0 0  Number falls in past yr: 0 0 -  Injury with Fall? 0 0 -  Risk for fall due to : - No Fall Risks -  Follow up Falls evaluation completed;Falls prevention discussed Falls evaluation completed -    FALL RISK PREVENTION PERTAINING TO THE HOME:  Any stairs in or around the home? No  If so, are there any without handrails? No  Home free of loose throw rugs in walkways, pet beds, electrical cords, etc? Yes  Adequate lighting in your home to reduce risk of falls? Yes   ASSISTIVE DEVICES UTILIZED TO PREVENT FALLS:  Life alert? No  Use of a cane, walker or w/c? No  Grab bars in the bathroom? Yes  Shower chair or bench in shower? Yes  Elevated toilet seat or a handicapped toilet? No   TIMED UP AND GO:  Was the test performed? No .   Cognitive Function:     6CIT Screen 05/29/2021  What Year? 0 points  What month? 3 points  What time? 0 points  Count back from 20 0 points  Months in reverse 0 points  Repeat phrase 6 points  Total Score 9    Immunizations Immunization History  Administered Date(s) Administered   Fluad Quad(high Dose 65+)  03/29/2021   Influenza, High Dose Seasonal PF 02/28/2018   Influenza-Unspecified 02/18/2013, 02/17/2014, 03/18/2015, 03/18/2017   PFIZER(Purple Top)SARS-COV-2 Vaccination 06/11/2019, 07/03/2019, 02/12/2020    TDAP status: Due, Education has been provided regarding the importance of this vaccine. Advised may receive this vaccine at local pharmacy or Health Dept. Aware to provide a copy of the vaccination record if obtained from local pharmacy or Health Dept. Verbalized acceptance and understanding.  Flu Vaccine status: Up to date  Pneumococcal vaccine status: Due, Education has been provided regarding the importance of this vaccine. Advised may receive this vaccine at local pharmacy or Health Dept. Aware to provide a copy of the vaccination record if obtained from local pharmacy or Health Dept. Verbalized acceptance and understanding.  Covid-19 vaccine status: Information provided on how to obtain vaccines.   Qualifies for Shingles Vaccine? Yes   Zostavax completed No   Shingrix Completed?: No.    Education has been provided regarding the importance of this vaccine. Patient has been advised to call insurance company to determine out of pocket expense if they have not yet received this vaccine. Advised may also receive vaccine at local pharmacy or Health Dept. Verbalized acceptance and understanding.  Screening Tests Health Maintenance  Topic Date Due   TETANUS/TDAP  Never done   Zoster Vaccines- Shingrix (1 of 2) Never done   Pneumonia Vaccine 35+ Years old (1 - PCV) Never done   COVID-19 Vaccine (4 - Booster for [employer] series) 04/08/2020   INFLUENZA VACCINE  Completed   DEXA SCAN  Completed   HPV VACCINES  Aged Out    Health Maintenance  Health Maintenance Due  Topic Date Due   TETANUS/TDAP  Never done   Zoster Vaccines- Shingrix (1 of 2) Never done   Pneumonia Vaccine 56+ Years old (1 - PCV) Never done   COVID-19 Vaccine (4 - Booster for  [employer] series) 04/08/2020     Colorectal cancer screening: No longer required.   Mammogram status: No longer required due to  .  Bone Density status: Completed  . Results reflect: Bone density results: OSTEOPOROSIS. Repeat every 2 years.  Nephrology orders for patient  Lung Cancer Screening: (Low Dose CT Chest recommended if Age 35-80 years, 30 pack-year currently smoking OR have quit w/in 15years.) qualify.   Lung Cancer Screening Referral:   Additional Screening:   Vision Screening: Recommended annual ophthalmology exams for early detection of glaucoma and other disorders of the eye. Is the patient up to date with their annual eye exam?  No  Who is the provider or what is the name of the office in which the patient attends annual eye exams?  If pt is not established with a provider, would they like to be referred to a provider to establish care? No .   Dental Screening: Recommended annual dental exams for proper oral hygiene  Community Resource Referral / Chronic Care Management: CRR required this visit?  No   CCM required this visit?  No      Plan:     I have personally reviewed and noted the following in the patients chart:   Medical and social history Use of alcohol, tobacco or illicit drugs  Current medications and supplements including opioid prescriptions.  Functional ability and status Nutritional status Physical activity Advanced directives List of other physicians Hospitalizations, surgeries, and ER visits in previous 12 months Vitals Screenings to include cognitive, depression, and falls Referrals and appointments  In addition, I have reviewed and discussed with patient certain preventive protocols, quality metrics, and best practice recommendations. A written personalized care plan for preventive services as well as general preventive health recommendations were provided to patient.     Leroy Kennedy, LPN   [id number]   Nurse Notes:

## 2021-05-29 NOTE — Patient Instructions (Signed)
Ms. Kaitlin [name] , Thank you for taking time to come for your Medicare Wellness Visit. I appreciate your ongoing commitment to your health goals. Please review the following plan we discussed and let me know if I can assist you in the future.   Screening recommendations/referrals: Colonoscopy: no longer required Mammogram: no longer required Bone Density: up to date Recommended yearly ophthalmology/optometry visit for glaucoma screening and checkup Recommended yearly dental visit for hygiene and checkup  Vaccinations: Influenza vaccine: up to date Pneumococcal vaccine: Education provided Tdap vaccine: Education provided Shingles vaccine: Education provided    Advanced directives: yes  Conditions/risks identified:   Next appointment: 06-29-2021 @ 9:10   Preventive Care 85 Years and Older, Female Preventive care refers to lifestyle choices and visits with your health care provider that can promote health and wellness. What does preventive care include? A yearly physical exam. This is also called an annual well check. Dental exams once or twice a year. Routine eye exams. Ask your health care provider how often you should have your eyes checked. Personal lifestyle choices, including: Daily care of your teeth and gums. Regular physical activity. Eating a healthy diet. Avoiding tobacco and drug use. Limiting alcohol use. Practicing safe sex. Taking low-dose aspirin every day. Taking vitamin and mineral supplements as recommended by your health care provider. What happens during an annual well check? The services and screenings done by your health care provider during your annual well check will depend on your age, overall health, lifestyle risk factors, and family history of disease. Counseling  Your health care provider may ask you questions about your: Alcohol use. Tobacco use. Drug use. Emotional well-being. Home and relationship well-being. Sexual activity. Eating  habits. History of falls. Memory and ability to understand (cognition). Work and work Statistician. Reproductive health. Screening  You may have the following tests or measurements: Height, weight, and BMI. Blood pressure. Lipid and cholesterol levels. These may be checked every 5 years, or more frequently if you are over 20 years old. Skin check. Lung cancer screening. You may have this screening every year starting at age 6 if you have a 30-pack-year history of smoking and currently smoke or have quit within the past 15 years. Fecal occult blood test (FOBT) of the stool. You may have this test every year starting at age 75. Flexible sigmoidoscopy or colonoscopy. You may have a sigmoidoscopy every 5 years or a colonoscopy every 10 years starting at age 21. Hepatitis C blood test. Hepatitis B blood test. Sexually transmitted disease (STD) testing. Diabetes screening. This is done by checking your blood sugar (glucose) after you have not eaten for a while (fasting). You may have this done every 1-3 years. Bone density scan. This is done to screen for osteoporosis. You may have this done starting at age 80. Mammogram. This may be done every 1-2 years. Talk to your health care provider about how often you should have regular mammograms. Talk with your health care provider about your test results, treatment options, and if necessary, the need for more tests. Vaccines  Your health care provider may recommend certain vaccines, such as: Influenza vaccine. This is recommended every year. Tetanus, diphtheria, and acellular pertussis (Tdap, Td) vaccine. You may need a Td booster every 10 years. Zoster vaccine. You may need this after age 82. Pneumococcal 13-valent conjugate (PCV13) vaccine. One dose is recommended after age 85. Pneumococcal polysaccharide (PPSV23) vaccine. One dose is recommended after age 85. Talk to your health care provider about which screenings and  vaccines you need and how  often you need them. This information is not intended to replace advice given to you by your health care provider. Make sure you discuss any questions you have with your health care provider. Document Released: 05/27/2015 Document Revised: 01/18/2016 Document Reviewed: 03/01/2015 Elsevier Interactive Patient Education  2017 [place] Prevention in the Home Falls can cause injuries. They can happen to people of all ages. There are many things you can do to make your home safe and to help prevent falls. What can I do on the outside of my home? Regularly fix the edges of walkways and driveways and fix any cracks. Remove anything that might make you trip as you walk through a door, such as a raised step or threshold. Trim any bushes or trees on the path to your home. Use bright outdoor lighting. Clear any walking paths of anything that might make someone trip, such as rocks or tools. Regularly check to see if handrails are loose or broken. Make sure that both sides of any steps have handrails. Any raised decks and porches should have guardrails on the edges. Have any leaves, snow, or ice cleared regularly. Use sand or salt on walking paths during winter. Clean up any spills in your garage right away. This includes oil or grease spills. What can I do in the bathroom? Use night lights. Install grab bars by the toilet and in the tub and shower. Do not use towel bars as grab bars. Use non-skid mats or decals in the tub or shower. If you need to sit down in the shower, use a plastic, non-slip stool. Keep the floor dry. Clean up any water that spills on the floor as soon as it happens. Remove soap buildup in the tub or shower regularly. Attach bath mats securely with double-sided non-slip rug tape. Do not have throw rugs and other things on the floor that can make you trip. What can I do in the bedroom? Use night lights. Make sure that you have a light by your bed that is easy to  reach. Do not use any sheets or blankets that are too big for your bed. They should not hang down onto the floor. Have a firm chair that has side arms. You can use this for support while you get dressed. Do not have throw rugs and other things on the floor that can make you trip. What can I do in the kitchen? Clean up any spills right away. Avoid walking on wet floors. Keep items that you use a lot in easy-to-reach places. If you need to reach something above you, use a strong step stool that has a grab bar. Keep electrical cords out of the way. Do not use floor polish or wax that makes floors slippery. If you must use wax, use non-skid floor wax. Do not have throw rugs and other things on the floor that can make you trip. What can I do with my stairs? Do not leave any items on the stairs. Make sure that there are handrails on both sides of the stairs and use them. Fix handrails that are broken or loose. Make sure that handrails are as long as the stairways. Check any carpeting to make sure that it is firmly attached to the stairs. Fix any carpet that is loose or worn. Avoid having throw rugs at the top or bottom of the stairs. If you do have throw rugs, attach them to the floor with carpet tape. Make  sure that you have a light switch at the top of the stairs and the bottom of the stairs. If you do not have them, ask someone to add them for you. What else can I do to help prevent falls? Wear shoes that: Do not have high heels. Have rubber bottoms. Are comfortable and fit you well. Are closed at the toe. Do not wear sandals. If you use a stepladder: Make sure that it is fully opened. Do not climb a closed stepladder. Make sure that both sides of the stepladder are locked into place. Ask someone to hold it for you, if possible. Clearly mark and make sure that you can see: Any grab bars or handrails. First and last steps. Where the edge of each step is. Use tools that help you move  around (mobility aids) if they are needed. These include: Canes. Walkers. Scooters. Crutches. Turn on the lights when you go into a dark area. Replace any light bulbs as soon as they burn out. Set up your furniture so you have a clear path. Avoid moving your furniture around. If any of your floors are uneven, fix them. If there are any pets around you, be aware of where they are. Review your medicines with your doctor. Some medicines can make you feel dizzy. This can increase your chance of falling. Ask your doctor what other things that you can do to help prevent falls. This information is not intended to replace advice given to you by your health care provider. Make sure you discuss any questions you have with your health care provider. Document Released: 02/24/2009 Document Revised: 10/06/2015 Document Reviewed: 06/04/2014 Elsevier Interactive Patient Education  2017 [employer].

## 2021-06-29 ENCOUNTER — Encounter: Payer: Self-pay | Admitting: Nurse Practitioner

## 2021-06-29 ENCOUNTER — Other Ambulatory Visit: Payer: Self-pay

## 2021-06-29 ENCOUNTER — Ambulatory Visit (INDEPENDENT_AMBULATORY_CARE_PROVIDER_SITE_OTHER): Payer: Medicare Other | Admitting: Nurse Practitioner

## 2021-06-29 VITALS — BP 111/75 | HR 67 | Temp 98.2°F | Wt 179.4 lb

## 2021-06-29 DIAGNOSIS — G309 Alzheimer's disease, unspecified: Secondary | ICD-10-CM

## 2021-06-29 DIAGNOSIS — N1832 Chronic kidney disease, stage 3b: Secondary | ICD-10-CM | POA: Diagnosis not present

## 2021-06-29 DIAGNOSIS — I10 Essential (primary) hypertension: Secondary | ICD-10-CM

## 2021-06-29 DIAGNOSIS — E210 Primary hyperparathyroidism: Secondary | ICD-10-CM

## 2021-06-29 DIAGNOSIS — E785 Hyperlipidemia, unspecified: Secondary | ICD-10-CM

## 2021-06-29 DIAGNOSIS — F0280 Dementia in other diseases classified elsewhere without behavioral disturbance: Secondary | ICD-10-CM

## 2021-06-29 DIAGNOSIS — Z23 Encounter for immunization: Secondary | ICD-10-CM

## 2021-06-29 DIAGNOSIS — M810 Age-related osteoporosis without current pathological fracture: Secondary | ICD-10-CM

## 2021-06-29 DIAGNOSIS — Z136 Encounter for screening for cardiovascular disorders: Secondary | ICD-10-CM

## 2021-06-29 MED ORDER — DONEPEZIL HCL 10 MG PO TABS: 10.0000 mg | ORAL_TABLET | Freq: Every day | ORAL | 1 refills | Status: DC

## 2021-06-29 MED ORDER — PRAVASTATIN SODIUM 40 MG PO TABS: 40.0000 mg | ORAL_TABLET | Freq: Every day | ORAL | 1 refills | Status: DC

## 2021-06-29 MED ORDER — AMLODIPINE BESYLATE 5 MG PO TABS: 5.0000 mg | ORAL_TABLET | Freq: Every day | ORAL | 1 refills | Status: DC

## 2021-06-29 MED ORDER — MEMANTINE HCL 10 MG PO TABS: 10.0000 mg | ORAL_TABLET | Freq: Two times a day (BID) | ORAL | 1 refills | Status: DC

## 2021-06-29 NOTE — Assessment & Plan Note (Signed)
Chronic. Followed by Endocrinology.  Dexa scan in 2011 showed T score of-4.4.  Receives Prolia injections twice annually.  Continue to follow per Endocrinologies recommendations.

## 2021-06-29 NOTE — Assessment & Plan Note (Signed)
Chronic.  Controlled.  Continue with current medication regimen of Aricept 10mg  and Namenda.  Refills sent today.  Return to clinic in 3 months for reevaluation.  Call sooner if concerns arise.

## 2021-06-29 NOTE — Assessment & Plan Note (Signed)
Chronic.  Controlled.  Had partial parathyroidectomy in April/May in 2022.  Followed by Dr. Gabriel Carina at [facility].  Has an appt in June. Return to clinic in 3 months for reevaluation.  Call sooner if concerns arise.

## 2021-06-29 NOTE — Assessment & Plan Note (Signed)
Chronic.  Labs ordered today. Will make recommendations based on lab results.  

## 2021-06-29 NOTE — Progress Notes (Signed)
BP 111/75    Pulse 67    Temp 98.2 F (36.8 C) (Oral)    Wt 179 lb 6.4 oz (81.4 kg)    SpO2 97%    BMI 29.85 kg/m    Subjective:    Patient ID: Kaitlin [name], female    DOB: [birth year], 87 y.o.   MRN: [id number]  HPI: DELILIAH [name] is a 87 y.o. female  Chief Complaint  Patient presents with   Dementia   Hyperlipidemia   Hypertension    HYPERTENSION Hypertension status: controlled  Satisfied with current treatment? no Duration of hypertension: years BP monitoring frequency:  not checking BP range:  BP medication side effects:  no Medication compliance: excellent compliance Previous BP meds:amlodipine Aspirin: no Recurrent headaches: no Visual changes: no Palpitations: no Dyspnea: no Chest pain: no Lower extremity edema: no Dizzy/lightheaded: no  MEMORY ISSUES Patient states she feels like her memory is doing pretty well.  She does word puzzles all day to help her memory.    Active Ambulatory Problems    Diagnosis Date Noted   Fibrocystic breast disease 12/18/2012   Alzheimer disease ([place]) 09/21/2013   Atrophic vaginitis 07/23/2013   Benign neoplasm of colon, unspecified 07/23/2013   Essential (primary) hypertension 07/23/2013   Gout 07/23/2013   Heart palpitations 06/23/2014   Hyperlipidemia 01/02/2017   Hyperparathyroidism, primary ([place]) 05/12/2014   Mitral regurgitation 01/02/2017   Internal derangement of left knee 01/14/2014   Nephrolithiasis 07/23/2013   Urinary incontinence 07/23/2013   Hypercalcemia 07/23/2013   Multiple thyroid nodules 05/12/2014   Osteoporosis 05/12/2014   Primary osteoarthritis of left knee 03/23/2014   SOB (shortness of breath) on exertion 06/23/2014   History of nephrolithiasis 10/28/2018   S/P parathyroidectomy ([place]) 08/16/2020   Stage 3b chronic kidney disease ([place]) 06/29/2021   Resolved Ambulatory Problems    Diagnosis Date Noted   No Resolved Ambulatory Problems   Past Medical History:  Diagnosis Date    Cystitis    Diffuse cystic mastopathy    Hypertension    Osteopetrosis    Past Surgical History:  Procedure Laterality Date   BLADDER STONE REMOVAL  2008   BREAST EXCISIONAL BIOPSY Right    benign; many years ago   BREAST LUMPECTOMY Right 1974   positive-no radiation or chemo per pt   COLONOSCOPY  1994   LAPAROSCOPY  2013   gallstone removal   NASAL SINUS SURGERY     OOPHORECTOMY Bilateral    PARATHYROIDECTOMY N/A 08/12/2020   Procedure: PARATHYROIDECTOMY with RNFA to assist;  Surgeon: Fredirick Maudlin, MD;  Location: [facility] ORS;  Service: General;  Laterality: N/A;   TONSILLECTOMY     TOTAL ABDOMINAL HYSTERECTOMY W/ BILATERAL SALPINGOOPHORECTOMY  1980   Family History  Problem Relation Age of Onset   Kidney Stones Brother    Dementia Mother    Aneurysm Mother    Other Father        unknown medical history   Breast cancer Neg Hx      Review of Systems  Eyes:  Negative for visual disturbance.  Respiratory:  Negative for cough, chest tightness and shortness of breath.   Cardiovascular:  Negative for chest pain, palpitations and leg swelling.  Neurological:  Negative for dizziness and headaches.   Per HPI unless specifically indicated above     Objective:    BP 111/75    Pulse 67    Temp 98.2 F (36.8 C) (Oral)    Wt 179 lb 6.4 oz (81.4 kg)  SpO2 97%    BMI 29.85 kg/m   Wt Readings from Last 3 Encounters:  06/29/21 179 lb 6.4 oz (81.4 kg)  03/29/21 169 lb 3.2 oz (76.7 kg)  02/09/21 170 lb (77.1 kg)    Physical Exam Vitals and nursing note reviewed.  Constitutional:      General: She is not in acute distress.    Appearance: Normal appearance. She is normal weight. She is not ill-appearing, toxic-appearing or diaphoretic.  HENT:     Head: Normocephalic.     Right Ear: External ear normal.     Left Ear: External ear normal.     Nose: Nose normal.     Mouth/Throat:     Mouth: Mucous membranes are moist.     Pharynx: Oropharynx is clear.  Eyes:     General:         Right eye: No discharge.        Left eye: No discharge.     Extraocular Movements: Extraocular movements intact.     Conjunctiva/sclera: Conjunctivae normal.     Pupils: Pupils are equal, round, and reactive to light.  Cardiovascular:     Rate and Rhythm: Normal rate and regular rhythm.     Heart sounds: No murmur heard. Pulmonary:     Effort: Pulmonary effort is normal. No respiratory distress.     Breath sounds: Normal breath sounds. No wheezing or rales.  Musculoskeletal:     Cervical back: Normal range of motion and neck supple.  Skin:    General: Skin is warm and dry.     Capillary Refill: Capillary refill takes less than 2 seconds.  Neurological:     General: No focal deficit present.     Mental Status: She is alert and oriented to person, place, and time. Mental status is at baseline.  Psychiatric:        Mood and Affect: Mood normal.        Behavior: Behavior normal.        Thought Content: Thought content normal.        Judgment: Judgment normal.    Results for orders placed or performed in visit on 02/09/21  CULTURE, URINE COMPREHENSIVE   Specimen: Urine   UR  Result Value Ref Range   Urine Culture, Comprehensive Final report    Organism ID, Bacteria Comment   Microscopic Examination   Urine  Result Value Ref Range   WBC, UA >30 (A) 0 - 5 /hpf   RBC 3-10 (A) 0 - 2 /hpf   Epithelial Cells (non renal) 0-10 0 - 10 /hpf   Bacteria, UA Many (A) None seen/Few  Urinalysis, Complete  Result Value Ref Range   Specific Gravity, UA 1.020 1.005 - 1.030   pH, UA 6.0 5.0 - 7.5   Color, UA Yellow Yellow   Appearance Ur Cloudy (A) Clear   Leukocytes,UA 3+ (A) Negative   Protein,UA Trace (A) Negative/Trace   Glucose, UA Negative Negative   Ketones, UA Negative Negative   RBC, UA 2+ (A) Negative   Bilirubin, UA Negative Negative   Urobilinogen, Ur 0.2 0.2 - 1.0 mg/dL   Nitrite, UA Positive (A) Negative   Microscopic Examination See below:       Assessment &  Plan:   Problem List Items Addressed This Visit       Cardiovascular and Mediastinum   Essential (primary) hypertension    Chronic.  Controlled.  Continue with current medication regimen on Amlodipine 73m daily. Refill sent today.  Return to clinic in 3 months for reevaluation.  Will draw labs at next visit.  Call sooner if concerns arise.        Relevant Medications   amLODipine (NORVASC) 5 MG tablet   pravastatin (PRAVACHOL) 40 MG tablet   Other Relevant Orders   Comp Met (CMET)     Endocrine   Hyperparathyroidism, primary ([place]) - Primary    Chronic.  Controlled.  Had partial parathyroidectomy in April/May in 2022.  Followed by Dr[facility] Gabriel Carina at [facility].  Has an appt in June. Return to clinic in 3 months for reevaluation.  Call sooner if concerns arise.          Nervous and Auditory   Alzheimer disease ([place])    Chronic.  Controlled.  Continue with current medication regimen of Aricept 81m and Namenda.  Refills sent today.  Return to clinic in 3 months for reevaluation.  Call sooner if concerns arise.        Relevant Medications   donepezil (ARICEPT) 10 MG tablet   memantine (NAMENDA) 10 MG tablet     Musculoskeletal and Integument   Osteoporosis    Chronic. Followed by Endocrinology.  Dexa scan in 2011 showed T score of-4.4.  Receives Prolia injections twice annually.  Continue to follow per Endocrinologies recommendations.         Genitourinary   Stage 3b chronic kidney disease (HCC)    Chronic. Followed by Nephrology.  Will order labs today and make recommendations based on lab results.         Other   Hyperlipidemia    Chronic. Labs ordered today. Will make recommendations based on lab results.      Relevant Medications   amLODipine (NORVASC) 5 MG tablet   pravastatin (PRAVACHOL) 40 MG tablet   Other Visit Diagnoses     Screening for ischemic heart disease       Relevant Orders   Lipid Profile   Need for pneumococcal vaccination       Relevant  Orders   Pneumococcal conjugate vaccine 13-valent (Completed)        Follow up plan: Return in about 3 months (around 09/26/2021) for HTN, HLD, DM2 FU.

## 2021-06-29 NOTE — Assessment & Plan Note (Signed)
Chronic.  Controlled.  Continue with current medication regimen on Amlodipine 5mg  daily. Refill sent today.  Return to clinic in 3 months for reevaluation.  Will draw labs at next visit.  Call sooner if concerns arise.

## 2021-06-29 NOTE — Assessment & Plan Note (Signed)
Chronic. Followed by Nephrology.  Will order labs today and make recommendations based on lab results.

## 2021-06-30 LAB — COMPREHENSIVE METABOLIC PANEL
ALT: 11 IU/L (ref 0–32)
AST: 19 IU/L (ref 0–40)
Albumin/Globulin Ratio: 1.6 (ref 1.2–2.2)
Albumin: 4.1 g/dL (ref 3.6–4.6)
Alkaline Phosphatase: 92 IU/L (ref 44–121)
BUN/Creatinine Ratio: 18 (ref 12–28)
BUN: 17 mg/dL (ref 8–27)
Bilirubin Total: 0.7 mg/dL (ref 0.0–1.2)
CO2: 26 mmol/L (ref 20–29)
Calcium: 10.1 mg/dL (ref 8.7–10.3)
Chloride: 104 mmol/L (ref 96–106)
Creatinine, Ser: 0.94 mg/dL (ref 0.57–1.00)
Globulin, Total: 2.5 g/dL (ref 1.5–4.5)
Glucose: 93 mg/dL (ref 70–99)
Potassium: 4 mmol/L (ref 3.5–5.2)
Sodium: 143 mmol/L (ref 134–144)
Total Protein: 6.6 g/dL (ref 6.0–8.5)
eGFR: 60 mL/min/{1.73_m2} (ref >59)

## 2021-06-30 LAB — LIPID PANEL
Chol/HDL Ratio: 2.3 ratio (ref 0.0–4.4)
Cholesterol, Total: 158 mg/dL (ref 100–199)
HDL: 69 mg/dL (ref >39)
LDL Chol Calc (NIH): 77 mg/dL (ref 0–99)
Triglycerides: 56 mg/dL (ref 0–149)
VLDL Cholesterol Cal: 12 mg/dL (ref 5–40)

## 2021-06-30 NOTE — Progress Notes (Signed)
Please let patient know that her lab work looks good. No concerns at this time. Continue with your current medication regimen.  Follow up as discussed.  Please let me know if you have any questions.

## 2021-09-25 NOTE — Progress Notes (Signed)
? ?BP 120/84   Pulse 86   Temp 97.6 ?F (36.4 ?C) (Oral)   Wt 168 lb 9.6 oz (76.5 kg)   SpO2 92%   BMI 28.06 kg/m?   ? ?Subjective:  ? ? Patient ID: Kaitlin [name], female    DOB: 1935/03/17, 87 y.o.   MRN: [id number] ? ?HPI: ?Kaitlin [name] is a 87 y.o. female ? ?Chief Complaint  ?Patient presents with  ? Hypertension  ?  3 month follow up   ? Hyperlipidemia  ? Diabetes  ? ? ?HYPERTENSION ?Hypertension status: controlled  ?Satisfied with current treatment? no ?Duration of hypertension: years ?BP monitoring frequency:  not checking ?BP range:  ?BP medication side effects:  no ?Medication compliance: excellent compliance ?Previous BP meds:amlodipine ?Aspirin: no ?Recurrent headaches: no ?Visual changes: no ?Palpitations: no ?Dyspnea: no ?Chest pain: no ?Lower extremity edema: no ?Dizzy/lightheaded: no ? ?MEMORY ISSUES ?Patient states she feels like her memory is doing pretty well.  She does word puzzles all day to help her memory.  She is still independent in ADLs and lives alone.  She does not drive.    ? ?Relevant past medical, surgical, family and social history reviewed and updated as indicated. Interim medical history since our last visit reviewed. ?Allergies and medications reviewed and updated. ? ? ? ?Review of Systems  ?Eyes:  Negative for visual disturbance.  ?Respiratory:  Negative for cough, chest tightness and shortness of breath.   ?Cardiovascular:  Negative for chest pain, palpitations and leg swelling.  ?Neurological:  Negative for dizziness and headaches.  ? ?Per HPI unless specifically indicated above ? ?   ?Objective:  ?  ?BP 120/84   Pulse 86   Temp 97.6 ?F (36.4 ?C) (Oral)   Wt 168 lb 9.6 oz (76.5 kg)   SpO2 92%   BMI 28.06 kg/m?   ?Wt Readings from Last 3 Encounters:  ?09/26/21 168 lb 9.6 oz (76.5 kg)  ?06/29/21 179 lb 6.4 oz (81.4 kg)  ?03/29/21 169 lb 3.2 oz (76.7 kg)  ?  ?Physical Exam ?Vitals and nursing note reviewed.  ?Constitutional:   ?   General: She is not in acute distress. ?    Appearance: Normal appearance. She is normal weight. She is not ill-appearing, toxic-appearing or diaphoretic.  ?HENT:  ?   Head: Normocephalic.  ?   Right Ear: External ear normal.  ?   Left Ear: External ear normal.  ?   Nose: Nose normal.  ?   Mouth/Throat:  ?   Mouth: Mucous membranes are moist.  ?   Pharynx: Oropharynx is clear.  ?Eyes:  ?   General:     ?   Right eye: No discharge.     ?   Left eye: No discharge.  ?   Extraocular Movements: Extraocular movements intact.  ?   Conjunctiva/sclera: Conjunctivae normal.  ?   Pupils: Pupils are equal, round, and reactive to light.  ?Cardiovascular:  ?   Rate and Rhythm: Normal rate and regular rhythm.  ?   Heart sounds: No murmur heard. ?Pulmonary:  ?   Effort: Pulmonary effort is normal. No respiratory distress.  ?   Breath sounds: Normal breath sounds. No wheezing or rales.  ?Musculoskeletal:  ?   Cervical back: Normal range of motion and neck supple.  ?Skin: ?   General: Skin is warm and dry.  ?   Capillary Refill: Capillary refill takes less than 2 seconds.  ?Neurological:  ?   General: No focal deficit  present.  ?   Mental Status: She is alert and oriented to person, place, and time. Mental status is at baseline.  ?Psychiatric:     ?   Mood and Affect: Mood normal.     ?   Behavior: Behavior normal.     ?   Thought Content: Thought content normal.     ?   Judgment: Judgment normal.  ? ? ?Results for orders placed or performed in visit on 06/29/21  ?Comp Met (CMET)  ?Result Value Ref Range  ? Glucose 93 70 - 99 mg/dL  ? BUN 17 8 - 27 mg/dL  ? Creatinine, Ser 0.94 0.57 - 1.00 mg/dL  ? eGFR 60 >59 mL/min/1.73  ? BUN/Creatinine Ratio 18 12 - 28  ? Sodium 143 134 - 144 mmol/L  ? Potassium 4.0 3.5 - 5.2 mmol/L  ? Chloride 104 96 - 106 mmol/L  ? CO2 26 20 - 29 mmol/L  ? Calcium 10.1 8.7 - 10.3 mg/dL  ? Total Protein 6.6 6.0 - 8.5 g/dL  ? Albumin 4.1 3.6 - 4.6 g/dL  ? Globulin, Total 2.5 1.5 - 4.5 g/dL  ? Albumin/Globulin Ratio 1.6 1.2 - 2.2  ? Bilirubin Total 0.7  0.0 - 1.2 mg/dL  ? Alkaline Phosphatase 92 44 - 121 IU/L  ? AST 19 0 - 40 IU/L  ? ALT 11 0 - 32 IU/L  ?Lipid Profile  ?Result Value Ref Range  ? Cholesterol, Total 158 100 - 199 mg/dL  ? Triglycerides 56 0 - 149 mg/dL  ? HDL 69 >39 mg/dL  ? VLDL Cholesterol Cal 12 5 - 40 mg/dL  ? LDL Chol Calc (NIH) 77 0 - 99 mg/dL  ? Chol/HDL Ratio 2.3 0.0 - 4.4 ratio  ? ?   ?Assessment & Plan:  ? ?Problem List Items Addressed This Visit   ? ?  ? Cardiovascular and Mediastinum  ? Essential (primary) hypertension  ?  Chronic.  Controlled.  Continue with current medication regimen on Amlodipine 21m daily. Refill sent today.  Return to clinic in 3 months for reevaluation.  Labs ordered today.  Call sooner if concerns arise.  ? ? ?  ?  ? Relevant Medications  ? amLODipine (NORVASC) 5 MG tablet  ? pravastatin (PRAVACHOL) 40 MG tablet  ? Other Relevant Orders  ? Comp Met (CMET)  ?  ? Endocrine  ? Hyperparathyroidism, primary ([place] - Primary  ?  Chronic.  Controlled.  Had partial parathyroidectomy in April/May in 2022.  [facility]  Had an appt in April.  Return to clinic in 3 months for reevaluation.  Call sooner if concerns arise.  ? ? ?  ?  ?  ? Nervous and Auditory  ? Alzheimer disease ([place]  ?  Chronic.  Controlled.  Continue with current medication regimen of Aricept 125mand Namenda.  Continue with word puzzles.  Refills sent today.  Return to clinic in 3 months for reevaluation.  Call sooner if concerns arise.  ? ? ?  ?  ? Relevant Medications  ? donepezil (ARICEPT) 10 MG tablet  ? memantine (NAMENDA) 10 MG tablet  ?  ? Genitourinary  ? Stage 3b chronic kidney disease ([place] ?  Chronic. Followed by Nephrology.  Recently saw Nephrology in April.  Labs ordered at visit today.  Follow up in 3 months.  Call sooner if concerns arise.   ? ?  ?  ?  ? Other  ? Hyperlipidemia  ?  Chronic.  Controlled.  Continue with current medication regimen of Pravastatin daily.  Refills sent today.  Labs ordered today.   Return to clinic in 6 months for reevaluation.  Call sooner if concerns arise.  ? ? ?  ?  ? Relevant Medications  ? amLODipine (NORVASC) 5 MG tablet  ? pravastatin (PRAVACHOL) 40 MG tablet  ? Other Relevant Orders  ? Lipid Profile  ?  ? ?Follow up plan: ?Return in about 3 months (around 12/27/2021) for HTN, HLD, DM2 FU. ? ? ? ? ? ? ?

## 2021-09-26 ENCOUNTER — Ambulatory Visit (INDEPENDENT_AMBULATORY_CARE_PROVIDER_SITE_OTHER): Payer: Medicare Other | Admitting: Nurse Practitioner

## 2021-09-26 ENCOUNTER — Encounter: Payer: Self-pay | Admitting: Nurse Practitioner

## 2021-09-26 VITALS — BP 120/84 | HR 86 | Temp 97.6°F | Wt 168.6 lb

## 2021-09-26 DIAGNOSIS — E210 Primary hyperparathyroidism: Secondary | ICD-10-CM | POA: Diagnosis not present

## 2021-09-26 DIAGNOSIS — G309 Alzheimer's disease, unspecified: Secondary | ICD-10-CM

## 2021-09-26 DIAGNOSIS — I10 Essential (primary) hypertension: Secondary | ICD-10-CM

## 2021-09-26 DIAGNOSIS — E785 Hyperlipidemia, unspecified: Secondary | ICD-10-CM

## 2021-09-26 DIAGNOSIS — N1832 Chronic kidney disease, stage 3b: Secondary | ICD-10-CM

## 2021-09-26 DIAGNOSIS — F0280 Dementia in other diseases classified elsewhere without behavioral disturbance: Secondary | ICD-10-CM

## 2021-09-26 MED ORDER — MEMANTINE HCL 10 MG PO TABS: 10.0000 mg | ORAL_TABLET | Freq: Two times a day (BID) | ORAL | 1 refills | Status: DC

## 2021-09-26 MED ORDER — DONEPEZIL HCL 10 MG PO TABS: 10.0000 mg | ORAL_TABLET | Freq: Every day | ORAL | 1 refills | Status: DC

## 2021-09-26 MED ORDER — AMLODIPINE BESYLATE 5 MG PO TABS: 5.0000 mg | ORAL_TABLET | Freq: Every day | ORAL | 1 refills | Status: DC

## 2021-09-26 MED ORDER — PRAVASTATIN SODIUM 40 MG PO TABS: 40.0000 mg | ORAL_TABLET | Freq: Every day | ORAL | 1 refills | Status: DC

## 2021-09-26 NOTE — Assessment & Plan Note (Addendum)
Chronic.  Controlled.  Continue with current medication regimen on Amlodipine 5mg daily. Refill sent today.  Return to clinic in 3 months for reevaluation.  Labs ordered today.  Call sooner if concerns arise.   

## 2021-09-26 NOTE — Assessment & Plan Note (Signed)
Chronic.  Controlled.  Continue with current medication regimen of Pravastatin daily.  Refills sent today.  Labs ordered today.  Return to clinic in 6 months for reevaluation.  Call sooner if concerns arise.   

## 2021-09-26 NOTE — Assessment & Plan Note (Signed)
Chronic.  Controlled.  Had partial parathyroidectomy in April/May in 2022.  Followed by Dr. Solum at [facility].  Had an appt in April.  Return to clinic in 3 months for reevaluation.  Call sooner if concerns arise.   

## 2021-09-26 NOTE — Assessment & Plan Note (Signed)
Chronic. Followed by Nephrology.  Recently saw Nephrology in April.  Labs ordered at visit today.  Follow up in 3 months.  Call sooner if concerns arise.   ?

## 2021-09-26 NOTE — Assessment & Plan Note (Signed)
Chronic.  Controlled.  Continue with current medication regimen of Aricept '10mg'$  and Namenda.  Continue with word puzzles.  Refills sent today.  Return to clinic in 3 months for reevaluation.  Call sooner if concerns arise.  ? ?

## 2021-09-27 LAB — COMPREHENSIVE METABOLIC PANEL
ALT: 9 IU/L (ref 0–32)
AST: 18 IU/L (ref 0–40)
Albumin/Globulin Ratio: 1.9 (ref 1.2–2.2)
Albumin: 4.3 g/dL (ref 3.6–4.6)
Alkaline Phosphatase: 80 IU/L (ref 44–121)
BUN/Creatinine Ratio: 14 (ref 12–28)
BUN: 13 mg/dL (ref 8–27)
Bilirubin Total: 0.7 mg/dL (ref 0.0–1.2)
CO2: 22 mmol/L (ref 20–29)
Calcium: 10 mg/dL (ref 8.7–10.3)
Chloride: 104 mmol/L (ref 96–106)
Creatinine, Ser: 0.95 mg/dL (ref 0.57–1.00)
Globulin, Total: 2.3 g/dL (ref 1.5–4.5)
Glucose: 93 mg/dL (ref 70–99)
Potassium: 3.8 mmol/L (ref 3.5–5.2)
Sodium: 142 mmol/L (ref 134–144)
Total Protein: 6.6 g/dL (ref 6.0–8.5)
eGFR: 59 mL/min/{1.73_m2} — ABNORMAL LOW (ref >59)

## 2021-09-27 LAB — LIPID PANEL
Chol/HDL Ratio: 2.2 ratio (ref 0.0–4.4)
Cholesterol, Total: 128 mg/dL (ref 100–199)
HDL: 58 mg/dL (ref >39)
LDL Chol Calc (NIH): 59 mg/dL (ref 0–99)
Triglycerides: 49 mg/dL (ref 0–149)
VLDL Cholesterol Cal: 11 mg/dL (ref 5–40)

## 2021-09-27 NOTE — Progress Notes (Signed)
Hi Ms. Marysol. It was nice to see you yesterday.  Your lab work looks good.  Your kidney function remains stable.  No concerns at this time. Continue with your current medication regimen.  Follow up as discussed.  Please let me know if you have any questions.  ?

## 2021-12-14 ENCOUNTER — Encounter: Payer: Self-pay | Admitting: Nurse Practitioner

## 2021-12-27 ENCOUNTER — Ambulatory Visit: Payer: Medicare Other | Admitting: Nurse Practitioner

## 2022-01-18 NOTE — Progress Notes (Signed)
BP 118/74   Pulse 87   Temp 98.6 F (37 C) (Oral)   Wt 163 lb 3.2 oz (74 kg)   SpO2 97%   BMI 27.16 kg/m    Subjective:    Patient ID: Kaitlin [name], female    DOB: 04-22-1935, 87 y.o.   MRN: [id number]  HPI: Kaitlin [name] is a 87 y.o. female  Chief Complaint  Patient presents with   Hypertension   Hyperlipidemia   Diabetes    3 month follow up     HYPERTENSION Hypertension status: controlled  Satisfied with current treatment? no Duration of hypertension: years BP monitoring frequency:  not checking BP range:  BP medication side effects:  no Medication compliance: excellent compliance Previous BP meds:amlodipine Aspirin: no Recurrent headaches: no Visual changes: no Palpitations: no Dyspnea: no Chest pain: no Lower extremity edema: no Dizzy/lightheaded: no  MEMORY ISSUES Patient states she feels like her memory is doing pretty well.   Patient states her son does not feel like she is doing well.  Her son states she asks the same question multiple times.  Patient son does have concerns about her being confused.  States he doesn't find her wandering at all.      Active Ambulatory Problems    Diagnosis Date Noted   Fibrocystic breast disease 12/18/2012   Alzheimer disease ([place]) 09/21/2013   Atrophic vaginitis 07/23/2013   Benign neoplasm of colon, unspecified 07/23/2013   Essential (primary) hypertension 07/23/2013   Gout 07/23/2013   Heart palpitations 06/23/2014   Hyperlipidemia 01/02/2017   Hyperparathyroidism, primary ([place]) 05/12/2014   Mitral regurgitation 01/02/2017   Internal derangement of left knee 01/14/2014   Nephrolithiasis 07/23/2013   Urinary incontinence 07/23/2013   Hypercalcemia 07/23/2013   Multiple thyroid nodules 05/12/2014   Osteoporosis 05/12/2014   Primary osteoarthritis of left knee 03/23/2014   SOB (shortness of breath) on exertion 06/23/2014   History of nephrolithiasis 10/28/2018   S/P parathyroidectomy ([place]) 08/16/2020    Stage 3b chronic kidney disease ([place]) 06/29/2021   Resolved Ambulatory Problems    Diagnosis Date Noted   No Resolved Ambulatory Problems   Past Medical History:  Diagnosis Date   Cystitis    Diffuse cystic mastopathy    Hypertension    Osteopetrosis    Past Surgical History:  Procedure Laterality Date   BLADDER STONE REMOVAL  2008   BREAST EXCISIONAL BIOPSY Right    benign; many years ago   BREAST LUMPECTOMY Right 1974   positive-no radiation or chemo per pt   COLONOSCOPY  1994   LAPAROSCOPY  2013   gallstone removal   NASAL SINUS SURGERY     OOPHORECTOMY Bilateral    PARATHYROIDECTOMY N/A 08/12/2020   Procedure: PARATHYROIDECTOMY with RNFA to assist;  Surgeon: Fredirick Maudlin, MD;  Location: [facility] ORS;  Service: General;  Laterality: N/A;   TONSILLECTOMY     TOTAL ABDOMINAL HYSTERECTOMY W/ BILATERAL SALPINGOOPHORECTOMY  1980   Family History  Problem Relation Age of Onset   Kidney Stones Brother    Dementia Mother    Aneurysm Mother    Other Father        unknown medical history   Breast cancer Neg Hx      Review of Systems  Eyes:  Negative for visual disturbance.  Respiratory:  Negative for cough, chest tightness and shortness of breath.   Cardiovascular:  Negative for chest pain, palpitations and leg swelling.  Neurological:  Negative for dizziness and headaches.  Memory concerns    Per HPI unless specifically indicated above     Objective:    BP 118/74   Pulse 87   Temp 98.6 F (37 C) (Oral)   Wt 163 lb 3.2 oz (74 kg)   SpO2 97%   BMI 27.16 kg/m   Wt Readings from Last 3 Encounters:  01/19/22 163 lb 3.2 oz (74 kg)  09/26/21 168 lb 9.6 oz (76.5 kg)  06/29/21 179 lb 6.4 oz (81.4 kg)    Physical Exam Vitals and nursing note reviewed.  Constitutional:      General: She is not in acute distress.    Appearance: Normal appearance. She is normal weight. She is not ill-appearing, toxic-appearing or diaphoretic.  HENT:     Head: Normocephalic.      Right Ear: External ear normal.     Left Ear: External ear normal.     Nose: Nose normal.     Mouth/Throat:     Mouth: Mucous membranes are moist.     Pharynx: Oropharynx is clear.  Eyes:     General:        Right eye: No discharge.        Left eye: No discharge.     Extraocular Movements: Extraocular movements intact.     Conjunctiva/sclera: Conjunctivae normal.     Pupils: Pupils are equal, round, and reactive to light.  Cardiovascular:     Rate and Rhythm: Normal rate and regular rhythm.     Heart sounds: No murmur heard. Pulmonary:     Effort: Pulmonary effort is normal. No respiratory distress.     Breath sounds: Normal breath sounds. No wheezing or rales.  Musculoskeletal:     Cervical back: Normal range of motion and neck supple.  Skin:    General: Skin is warm and dry.     Capillary Refill: Capillary refill takes less than 2 seconds.  Neurological:     General: No focal deficit present.     Mental Status: She is alert and oriented to person, place, and time. Mental status is at baseline.  Psychiatric:        Mood and Affect: Mood normal.        Behavior: Behavior normal.        Thought Content: Thought content normal.        Judgment: Judgment normal.     Results for orders placed or performed in visit on 09/26/21  Comp Met (CMET)  Result Value Ref Range   Glucose 93 70 - 99 mg/dL   BUN 13 8 - 27 mg/dL   Creatinine, Ser 0.95 0.57 - 1.00 mg/dL   eGFR 59 (L) >59 mL/min/1.73   BUN/Creatinine Ratio 14 12 - 28   Sodium 142 134 - 144 mmol/L   Potassium 3.8 3.5 - 5.2 mmol/L   Chloride 104 96 - 106 mmol/L   CO2 22 20 - 29 mmol/L   Calcium 10.0 8.7 - 10.3 mg/dL   Total Protein 6.6 6.0 - 8.5 g/dL   Albumin 4.3 3.6 - 4.6 g/dL   Globulin, Total 2.3 1.5 - 4.5 g/dL   Albumin/Globulin Ratio 1.9 1.2 - 2.2   Bilirubin Total 0.7 0.0 - 1.2 mg/dL   Alkaline Phosphatase 80 44 - 121 IU/L   AST 18 0 - 40 IU/L   ALT 9 0 - 32 IU/L  Lipid Profile  Result Value Ref Range    Cholesterol, Total 128 100 - 199 mg/dL   Triglycerides 49 0 - 149  mg/dL   HDL 58 >39 mg/dL   VLDL Cholesterol Cal 11 5 - 40 mg/dL   LDL Chol Calc (NIH) 59 0 - 99 mg/dL   Chol/HDL Ratio 2.2 0.0 - 4.4 ratio      Assessment & Plan:   Problem List Items Addressed This Visit       Cardiovascular and Mediastinum   Essential (primary) hypertension - Primary    Chronic.  Controlled.  Continue with current medication regimen on Amlodipine 15m daily. Refill sent today.  Return to clinic in 3 months for reevaluation.  Labs ordered today.  Call sooner if concerns arise.        Relevant Orders   Comp Met (CMET)     Endocrine   Hyperparathyroidism, primary ([place]    Chronic.  Controlled.  Had partial parathyroidectomy in April/May in 2022.  [facility]  Had an [facility]t in April.  Return to clinic in 3 months for reevaluation.  Call sooner if concerns arise.          Nervous and Auditory   Alzheimer disease ([place]    Chronic. Patient's son raises concerns about memory decline.  Discussed with patient and son that she has lost some weight over the last 6 months.  Discussed adding in Boost or Ensure.  Discussed with patient that at some point in the near future she won't be able to live alone.  Will refer to Neurology to optimize medications.  Follow up in 3 months.  Call sooner if concerns arise.       Relevant Orders   Ambulatory referral to Neurology     Genitourinary   Stage 3b chronic kidney disease ([place]   Relevant Orders   Comp Met (CMET)     Other   Hyperlipidemia   Relevant Orders   Lipid Profile     Follow up plan: Return in about 3 months (around 04/20/2022) for HTN, HLD, DM2 FU.   A total of 30 minutes were spent on this encounter today.  When total time is documented, this includes both the face-to-face and non-face-to-face time personally spent before, during and after the visit on the date of the encounter symptoms, memory concerns, and future  progression of disease.

## 2022-01-19 ENCOUNTER — Ambulatory Visit (INDEPENDENT_AMBULATORY_CARE_PROVIDER_SITE_OTHER): Payer: Medicare Other | Admitting: Nurse Practitioner

## 2022-01-19 ENCOUNTER — Encounter: Payer: Self-pay | Admitting: Nurse Practitioner

## 2022-01-19 VITALS — BP 118/74 | HR 87 | Temp 98.6°F | Wt 163.2 lb

## 2022-01-19 DIAGNOSIS — E210 Primary hyperparathyroidism: Secondary | ICD-10-CM | POA: Diagnosis not present

## 2022-01-19 DIAGNOSIS — G309 Alzheimer's disease, unspecified: Secondary | ICD-10-CM | POA: Diagnosis not present

## 2022-01-19 DIAGNOSIS — E785 Hyperlipidemia, unspecified: Secondary | ICD-10-CM

## 2022-01-19 DIAGNOSIS — I10 Essential (primary) hypertension: Secondary | ICD-10-CM

## 2022-01-19 DIAGNOSIS — N1832 Chronic kidney disease, stage 3b: Secondary | ICD-10-CM

## 2022-01-19 DIAGNOSIS — F0280 Dementia in other diseases classified elsewhere without behavioral disturbance: Secondary | ICD-10-CM

## 2022-01-19 NOTE — Assessment & Plan Note (Signed)
Chronic.  Controlled.  Had partial parathyroidectomy in April/May in 2022.  Followed by Dr. Gabriel Carina at [facility].  Had an appt in April.  Return to clinic in 3 months for reevaluation.  Call sooner if concerns arise.

## 2022-01-19 NOTE — Assessment & Plan Note (Signed)
Chronic. Patient's son raises concerns about memory decline.  Discussed with patient and son that she has lost some weight over the last 6 months.  Discussed adding in Boost or Ensure.  Discussed with patient that at some point in the near future she won't be able to live alone.  Will refer to Neurology to optimize medications.  Follow up in 3 months.  Call sooner if concerns arise.

## 2022-01-19 NOTE — Assessment & Plan Note (Signed)
Chronic.  Controlled.  Continue with current medication regimen on Amlodipine 5mg daily. Refill sent today.  Return to clinic in 3 months for reevaluation.  Labs ordered today.  Call sooner if concerns arise.   

## 2022-01-20 LAB — COMPREHENSIVE METABOLIC PANEL
ALT: 9 IU/L (ref 0–32)
AST: 15 IU/L (ref 0–40)
Albumin/Globulin Ratio: 2 (ref 1.2–2.2)
Albumin: 4 g/dL (ref 3.7–4.7)
Alkaline Phosphatase: 75 IU/L (ref 44–121)
BUN/Creatinine Ratio: 19 (ref 12–28)
BUN: 18 mg/dL (ref 8–27)
Bilirubin Total: 0.5 mg/dL (ref 0.0–1.2)
CO2: 23 mmol/L (ref 20–29)
Calcium: 9.1 mg/dL (ref 8.7–10.3)
Chloride: 108 mmol/L — ABNORMAL HIGH (ref 96–106)
Creatinine, Ser: 0.95 mg/dL (ref 0.57–1.00)
Globulin, Total: 2 g/dL (ref 1.5–4.5)
Glucose: 92 mg/dL (ref 70–99)
Potassium: 4.7 mmol/L (ref 3.5–5.2)
Sodium: 146 mmol/L — ABNORMAL HIGH (ref 134–144)
Total Protein: 6 g/dL (ref 6.0–8.5)
eGFR: 59 mL/min/{1.73_m2} — ABNORMAL LOW (ref >59)

## 2022-01-20 LAB — LIPID PANEL
Chol/HDL Ratio: 2.3 ratio (ref 0.0–4.4)
Cholesterol, Total: 129 mg/dL (ref 100–199)
HDL: 55 mg/dL (ref >39)
LDL Chol Calc (NIH): 62 mg/dL (ref 0–99)
Triglycerides: 53 mg/dL (ref 0–149)
VLDL Cholesterol Cal: 12 mg/dL (ref 5–40)

## 2022-01-22 NOTE — Progress Notes (Signed)
Please let patient know that her lab work looks good.  I recommend she increase her water intake to make sure she is well hydrated.  No other concerns at this time.  Follow up as discussed.

## 2022-02-14 NOTE — Progress Notes (Addendum)
02/15/22 9:21 AM   Kaitlin [name] Feb 13, 1935 [id number]  Referring provider:  No referring provider defined for this encounter.  Urological history  1. High risk hematuria - Former smoker - CTU (11/2019) Small bilateral nonobstructive renal calculi. There is prominence of the left renal pelvis and the ureters are both mildly patulous to the ureterovesicular junction without overt hydronephrosis. No evidence of obstructing lesion or filling defect on delayed phase imaging - Cysto (11/2019) NED - no reports of gross hematuria - UA 3-10 RBC's   2. rUTI's - contributing factors of age, Alzheimer's, vaginal atrophy, incontinence and constipation - documented urine cultures over the last year   August 24, 2021 E. coli and Citrobacter koseri    3. Nephrolithiasis - CTU (11/2019) small bilateral nonobstructive stones   4. Vaginal atrophy - not bothersome    5. Urgency - managing conservatively   HPI: Kaitlin [name] is a 87 y.o.female who returns today for 1 year follow up her friend, Juliann Pulse.    On OAB questionnaire, she is having 1-7 daytime urination with a mild urge to urinate.  She experiences incontinence with stress.  She is leaking 3 more times weekly.  She wears a pad a day.  She does not limit fluid intake and she does not engage in toilet mapping.    She denies any nocturia.  Her SUI is not bothersome.    Patient denies any modifying or aggravating factors.  Patient denies any gross hematuria, dysuria or suprapubic/flank pain.  Patient denies any fevers, chills, nausea or vomiting.    UA nitrate positive, greater than 30 WBCs, 3-10 RBCs and many bacteria.  This appears to be her baseline UA.  She is asymptomatic at this time.   PMH: Past Medical History:  Diagnosis Date   Alzheimer disease ([place])    Cystitis    Diffuse cystic mastopathy    Gout    Hypertension    Osteopetrosis     Surgical History: Past Surgical History:  Procedure Laterality Date   BLADDER  STONE REMOVAL  2008   BREAST EXCISIONAL BIOPSY Right    benign; many years ago   BREAST LUMPECTOMY Right 1974   positive-no radiation or chemo per pt   COLONOSCOPY  1994   LAPAROSCOPY  2013   gallstone removal   NASAL SINUS SURGERY     OOPHORECTOMY Bilateral    PARATHYROIDECTOMY N/A 08/12/2020   Procedure: PARATHYROIDECTOMY with RNFA to assist;  Surgeon: Fredirick Maudlin, MD;  Location: [facility] ORS;  Service: General;  Laterality: N/A;   TONSILLECTOMY     TOTAL ABDOMINAL HYSTERECTOMY W/ BILATERAL SALPINGOOPHORECTOMY  1980    Home Medications:  Allergies as of 02/15/2022       Reactions   Augmentin [amoxicillin-pot Clavulanate] Nausea And Vomiting, Rash   Tetracyclines & Related Rash   Tylenol [acetaminophen] Rash        Medication List        Accurate as of February 15, 2022  9:21 AM. If you have any questions, ask your nurse or doctor.          amLODipine 5 MG tablet Commonly known as: NORVASC Take 1 tablet (5 mg total) by mouth daily.   aspirin 81 MG tablet Take 81 mg by mouth daily.   Biotin 1000 MCG tablet Take 1,000 mcg by mouth daily.   calcium carbonate 1250 (500 Ca) MG tablet Commonly known as: OS-CAL - dosed in mg of elemental calcium Take by mouth.   donepezil 10 MG tablet  Commonly known as: ARICEPT Take 1 tablet (10 mg total) by mouth at bedtime.   memantine 10 MG tablet Commonly known as: NAMENDA Take 1 tablet (10 mg total) by mouth 2 (two) times daily.   Osteo Bi-Flex Adv Triple St Tabs Take 1 tablet by mouth in the morning and at bedtime.   pravastatin 40 MG tablet Commonly known as: PRAVACHOL Take 1 tablet (40 mg total) by mouth daily.        Allergies:  Allergies  Allergen Reactions   Augmentin [Amoxicillin-Pot Clavulanate] Nausea And Vomiting and Rash   Tetracyclines & Related Rash   Tylenol [Acetaminophen] Rash    Family History: Family History  Problem Relation Age of Onset   Kidney Stones Brother    Dementia Mother     Aneurysm Mother    Other Father        unknown medical history   Breast cancer Neg Hx     Social History:  reports that she quit smoking about 31 years ago. Her smoking use included cigarettes. She smoked an average of .5 packs per day. She has never used smokeless tobacco. She reports that she does not drink alcohol and does not use drugs.   Physical Exam: BP 115/73   Pulse (!) 58   Ht '5\' 6"'  (1.676 m)   Wt 162 lb (73.5 kg)   BMI 26.15 kg/m   Constitutional:  Well nourished. Alert and oriented, No acute distress. HEENT: [place] AT, moist mucus membranes.  Trachea midline Cardiovascular: No clubbing, cyanosis, or edema. Respiratory: Normal respiratory effort, no increased work of breathing. Neurologic: Grossly intact, no focal deficits, moving all 4 extremities. Psychiatric: Normal mood and affect.    Laboratory Data: Vitamin D, 25-Hydroxy - [facility] 30.0 - 100.0 ng/mL 53.9    Glucose 70 - 110 mg/dL 89   Sodium 136 - 145 mmol/L 143   Potassium 3.6 - 5.1 mmol/L 3.7   Chloride 97 - 109 mmol/L 106   Carbon Dioxide (CO2) 22.0 - 32.0 mmol/L 26.2   Urea Nitrogen (BUN) 7 - 25 mg/dL 17   Creatinine 0.6 - 1.1 mg/dL 1.0   Glomerular Filtration Rate (eGFR), MDRD Estimate >60 mL/min/1.73sq m 53 Low    Calcium 8.7 - 10.3 mg/dL 9.6   AST  8 - 39 U/L 16   ALT  5 - 38 U/L 9   Alk Phos (alkaline Phosphatase) 34 - 104 U/L 66   Albumin 3.5 - 4.8 g/dL 4.1   Bilirubin, Total 0.3 - 1.2 mg/dL 0.9   Protein, Total 6.1 - 7.9 g/dL 6.4   A/G Ratio 1.0 - 5.0 gm/dL 1.8   Resulting Agency  [facility]  Specimen Collected: 10/13/21 11:00   Performed by: [facility] Last Resulted: 10/13/21 14:18  Received From: [place]  Result Received: 12/14/21 15:37   Color, UA  Light Orange   Clarity, UA  Cloudy   Specific Gravity, UA 1.003 - 1.030 1.015   pH, UA 5.0 - 9.0 6.0   Leukocyte Esterase, UA Negative Large Abnormal    Nitrite, UA Negative Negative    Protein, UA Negative 70 mg/dL Abnormal    Glucose, UA Negative Negative   Ketones, UA Negative Negative   Urobilinogen, UA <2.0 mg/dL <2.0 mg/dL   Bilirubin, UA Negative Negative   Blood, UA Negative Moderate Abnormal    RBC, UA <=4 /HPF 16 High    WBC, UA 0 - 5 /HPF >182 High  Squam Epithel, UA 0 - 5 /HPF <1   Bacteria, UA None Seen /HPF Many Abnormal    Mucus, UA None Seen /HPF Occasional Abnormal    Resulting Agency  [facility]  Specimen Collected: 08/24/21 12:49 Last Resulted: 08/24/21 17:31  Received From: [place]  Result Received: 09/25/21 10:00   Component 5 mo ago  Urine Culture, Comprehensive  10,000 to 50,000 CFU/mL Escherichia coli Abnormal    Urine Culture, Comprehensive  >100,000 CFU/mL Citrobacter koseri Abnormal    Resulting Agency [facility]ATOR[facility]  Susceptibility  Organism Antibiotic Method Susceptibility  Escherichia coli Ampicillin MIC SUSCEPTIBILITY RESULT Susceptible  Escherichia coli Cefazolin MIC SUSCEPTIBILITY RESULT Susceptible  Escherichia coli Cephalexin MIC SUSCEPTIBILITY RESULT Susceptible   Comment: For uncomplicated UTI's only.  Escherichia coli Ciprofloxacin MIC SUSCEPTIBILITY RESULT Susceptible  Escherichia coli Gentamicin MIC SUSCEPTIBILITY RESULT Susceptible  Escherichia coli Levofloxacin MIC SUSCEPTIBILITY RESULT Susceptible  Escherichia coli Nitrofurantoin MIC SUSCEPTIBILITY RESULT Susceptible  Escherichia coli Piperacillin + Tazobactam MIC SUSCEPTIBILITY RESULT Susceptible  Escherichia coli Tetracycline MIC SUSCEPTIBILITY RESULT Susceptible   Comment: Organisms that test susceptible to tetracycline are considered susceptible to doxycycline.However, some organisms that test intermediate or resistant to tetracycline may be susceptible to doxycycline.  Escherichia coli Tobramycin MIC SUSCEPTIBILITY RESULT Susceptible  Escherichia coli Trimethoprim + Sulfamethoxazole MIC SUSCEPTIBILITY RESULT  Susceptible  Citrobacter koseri Amoxicillin + Clavulanate MIC SUSCEPTIBILITY RESULT Susceptible  Citrobacter koseri Ampicillin MIC SUSCEPTIBILITY RESULT Resistant  Citrobacter koseri Ampicillin + Sulbactam MIC SUSCEPTIBILITY RESULT Susceptible  Citrobacter koseri Cefazolin MIC SUSCEPTIBILITY RESULT Susceptible  Citrobacter koseri Ciprofloxacin MIC SUSCEPTIBILITY RESULT Susceptible  Citrobacter koseri Gentamicin MIC SUSCEPTIBILITY RESULT Susceptible  Citrobacter koseri Levofloxacin MIC SUSCEPTIBILITY RESULT Susceptible  Citrobacter koseri Nitrofurantoin MIC SUSCEPTIBILITY RESULT Susceptible  Citrobacter koseri Piperacillin + Tazobactam MIC SUSCEPTIBILITY RESULT Susceptible  Citrobacter koseri Tetracycline MIC SUSCEPTIBILITY RESULT Susceptible   Comment: Organisms that test susceptible to tetracycline are considered susceptible to doxycycline.However, some organisms that test intermediate or resistant to tetracycline may be susceptible to doxycycline.  Citrobacter koseri Tobramycin MIC SUSCEPTIBILITY RESULT Susceptible  Citrobacter koseri Trimethoprim + Sulfamethoxazole MIC SUSCEPTIBILITY RESULT Susceptible   Narrative Performed by [facility] CL[facility] LABORATORIES Specimen Source: Clean Catch Specimen Collected: 08/24/21 12:49   Performed by: [facility] Last Resulted: 08/27/21 07:56  Received From: [place]  Result Received: 12/14/21 15:37   Urinalysis See Epic and HPI I have reviewed the labs.   Pertinent Imaging  02/15/22 09:02  Scan Result 33m    Assessment & Plan:    High risk hematuria  - former smoker - Work-up in 11/2019 NED  - UA 3-10 RBCs- likely due to colonization/nephrolithiasis  rUTI's  - had UTI in 08/2021 (symptoms of increased frequency) treated w/ culture appropriate antibiotics  - Urine today is grossly positive; nitrite positive with micro heme  - asymptomatic at this time  - Urine sent for a culture in case she develops  symptoms in the future   Nephrolithiasis  - Bilateral nephrolithiasis on CTU 11/2019  - Asymptomatic at this time   Vaginal atrophy  - asymptomatic   Return in about 1 year (around 02/16/2023) for UA and symptom recheck .  Taren Dymek, PCambridge[address] SMagnessBMentone [place] [phone]

## 2022-02-15 ENCOUNTER — Ambulatory Visit (INDEPENDENT_AMBULATORY_CARE_PROVIDER_SITE_OTHER): Payer: Medicare Other | Admitting: Urology

## 2022-02-15 ENCOUNTER — Encounter: Payer: Self-pay | Admitting: Urology

## 2022-02-15 VITALS — BP 115/73 | HR 58 | Ht 66.0 in | Wt 162.0 lb

## 2022-02-15 DIAGNOSIS — N952 Postmenopausal atrophic vaginitis: Secondary | ICD-10-CM | POA: Diagnosis not present

## 2022-02-15 DIAGNOSIS — Z87440 Personal history of urinary (tract) infections: Secondary | ICD-10-CM | POA: Diagnosis not present

## 2022-02-15 DIAGNOSIS — R319 Hematuria, unspecified: Secondary | ICD-10-CM

## 2022-02-15 DIAGNOSIS — N390 Urinary tract infection, site not specified: Secondary | ICD-10-CM

## 2022-02-15 DIAGNOSIS — N200 Calculus of kidney: Secondary | ICD-10-CM

## 2022-02-15 LAB — URINALYSIS, COMPLETE
Bilirubin, UA: NEGATIVE
Glucose, UA: NEGATIVE
Ketones, UA: NEGATIVE
Nitrite, UA: POSITIVE — AB
Specific Gravity, UA: 1.02 (ref 1.005–1.030)
Urobilinogen, Ur: 0.2 mg/dL (ref 0.2–1.0)
pH, UA: 6 (ref 5.0–7.5)

## 2022-02-15 LAB — MICROSCOPIC EXAMINATION: WBC, UA: >30 /hpf — AB (ref 0–5)

## 2022-02-15 LAB — BLADDER SCAN AMB NON-IMAGING

## 2022-02-20 LAB — CULTURE, URINE COMPREHENSIVE

## 2022-02-21 ENCOUNTER — Telehealth: Payer: Self-pay

## 2022-02-21 NOTE — Telephone Encounter (Signed)
-----   Message from Nori Riis, PA-C sent at 02/20/2022  9:17 PM EDT ----- Please let Mrs. Schupp urine culture grew out a bacteria and if she is having symptoms of an UTI, we should treat with an antibiotic. If she is not having any symptoms, we will see her next year.

## 2022-02-21 NOTE — Telephone Encounter (Signed)
Notified pt as advised. Pt states she is not experiencing symptoms at this time.

## 2022-04-20 ENCOUNTER — Ambulatory Visit: Payer: Medicare Other | Admitting: Nurse Practitioner

## 2022-05-15 ENCOUNTER — Other Ambulatory Visit: Payer: Self-pay | Admitting: Nurse Practitioner

## 2022-05-16 NOTE — Telephone Encounter (Signed)
Requested Prescriptions  Pending Prescriptions Disp Refills   donepezil (ARICEPT) 10 MG tablet [Pharmacy Med Name: DONEPEZIL HCL TABS '10MG'$ ] 90 tablet 3    Sig: TAKE 1 TABLET AT BEDTIME     Neurology:  Alzheimer's Agents Passed - 05/15/2022  2:19 AM      Passed - Valid encounter within last 6 months    Recent Outpatient Visits           3 months ago Essential (primary) hypertension   [facility] Jon Billings, NP   7 months ago Hyperparathyroidism, primary [facility])   [facility] Jon Billings, NP   10 months ago Hyperparathyroidism, primary [facility])   [place], Karen, NP   1 year ago Essential (primary) hypertension   [facility] Jon Billings, NP       Future Appointments             In 9 months McGowan, Gordan Payment [place]

## 2022-05-29 ENCOUNTER — Telehealth: Payer: Self-pay | Admitting: Nurse Practitioner

## 2022-05-29 NOTE — Telephone Encounter (Signed)
Copied from [place] [id number]. Topic: Medicare AWV >> May 29, 2022  4:30 PM Josephina Gip wrote: Reason for CRM: Left message for patient to call back and schedule the Medicare Annual Wellness Visit (AWV) virtually or by telephone.  Last AWV 05/29/21  Please schedule at anytime with CFP-Nurse Health Advisor.  30 minute appointment  Any questions, please call me at [phone]

## 2022-05-30 ENCOUNTER — Other Ambulatory Visit: Payer: Self-pay | Admitting: Nurse Practitioner

## 2022-05-30 NOTE — Telephone Encounter (Signed)
Requested Prescriptions  Pending Prescriptions Disp Refills   memantine (NAMENDA) 10 MG tablet [Pharmacy Med Name: MEMANTINE HCL TABS '10MG'$ ] 180 tablet 0    Sig: TAKE 1 TABLET TWICE A DAY     Neurology:  Alzheimer's Agents 2 Passed - 05/30/2022  2:09 AM      Passed - Cr in normal range and within 360 days    Creatinine  Date Value Ref Range Status  05/16/2014 0.66 0.60 - 1.30 mg/dL Final   Creatinine, Ser  Date Value Ref Range Status  01/19/2022 0.95 0.57 - 1.00 mg/dL Final         Passed - eGFR is 5 or above and within 360 days    EGFR (African American)  Date Value Ref Range Status  05/16/2014 >60 >81m/min Final  09/04/2011 >60  Final   EGFR (Non-African Amer.)  Date Value Ref Range Status  05/16/2014 >60 >698mmin Final    Comment:    eGFR values <6064min/1.73 m2 may be an indication of chronic kidney disease (CKD). Calculated eGFR, using the MRDR Study equation, is useful in  patients with stable renal function. The eGFR calculation will not be reliable in acutely ill patients when serum creatinine is changing rapidly. It is not useful in patients on dialysis. The eGFR calculation may not be applicable to patients at the low and high extremes of body sizes, pregnant women, and vegetarians.   09/04/2011 54 (L)  Final    Comment:    eGFR values <22m66mn/1.73 m2 may be an indication of chronic kidney disease (CKD). Calculated eGFR is useful in patients with stable renal function. The eGFR calculation will not be reliable in acutely ill patients when serum creatinine is changing rapidly. It is not useful in  patients on dialysis. The eGFR calculation may not be applicable to patients at the low and high extremes of body sizes, pregnant women, and vegetarians.    eGFR  Date Value Ref Range Status  01/19/2022 59 (L) >59 mL/min/1.73 Final         Passed - Valid encounter within last 6 months    Recent Outpatient Visits           4 months ago Essential (primary)  hypertension   [facility]   8 months [facility]   11 months [facility], [facility]   1 year ago Essential (primary) hypertension   [facility]       Future Appointments             In 1 week HoldJon Billings [facility] [place] 8 months McGowan, Shannon A, [place]

## 2022-06-06 NOTE — Progress Notes (Unsigned)
There were no vitals taken for this visit.   Subjective:    Patient ID: Kaitlin [name], female    DOB: 1934/11/23, 88 y.o.   MRN: [id number]  HPI: Kaitlin [name] is a 88 y.o. female presenting on 06/07/2022 for comprehensive medical examination. Current medical complaints include:{Blank single:19197::"none","***"}  She currently lives with: Menopausal Symptoms: {Blank single:19197::"yes","no"}  HYPERTENSION Hypertension status: controlled  Satisfied with current treatment? no Duration of hypertension: years BP monitoring frequency:  not checking BP range:  BP medication side effects:  no Medication compliance: excellent compliance Previous BP meds:amlodipine Aspirin: no Recurrent headaches: no Visual changes: no Palpitations: no Dyspnea: no Chest pain: no Lower extremity edema: no Dizzy/lightheaded: no  MEMORY ISSUES Patient states she feels like her memory is doing pretty well.   Patient states her son does not feel like she is doing well.  Her son states she asks the same question multiple times.  Patient son does have concerns about her being confused.  States he doesn't find her wandering at all.      Functional Status Survey:       01/19/2022   11:04 AM 09/26/2021    9:46 AM 06/29/2021    9:15 AM 05/29/2021    9:03 AM 03/29/2021    3:12 PM  Fall Risk   Falls in the past year? 0 0 0 0 0  Number falls in past yr: 0 0 0 0 0  Injury with Fall? 0 0 0 0 0  Risk for fall due to : No Fall Risks No Fall Risks No Fall Risks  No Fall Risks  Follow up Falls evaluation completed Falls evaluation completed Falls evaluation completed Falls evaluation completed;Falls prevention discussed Falls evaluation completed    Depression Screen    01/19/2022   11:04 AM 09/26/2021    9:46 AM 06/29/2021    9:15 AM 05/29/2021    9:19 AM 03/29/2021    3:11 PM  Depression screen PHQ 2/9  Decreased Interest 0 0 0 0 0  Down, Depressed, Hopeless 0 0 0 0 0  PHQ - 2 Score 0 0 0 0 0  Altered  sleeping 0 0 0  0  Tired, decreased energy 0 0 0  0  Change in appetite 0 0 0  0  Feeling bad or failure about yourself  0 0 0  0  Trouble concentrating 0 0 0  0  Moving slowly or fidgety/restless 0 0 0  0  Suicidal thoughts 0 0 0  0  PHQ-9 Score 0 0 0  0  Difficult doing work/chores Not difficult at all Not difficult at all Not difficult at all       Advanced Directives Does patient have a HCPOA?    {Blank single:19197::"yes","no"} If yes, name and contact information:  Does patient have a living will or MOST form?  {Blank single:19197::"yes","no"}  Past Medical History:  Past Medical History:  Diagnosis Date   Alzheimer disease ([place])    Cystitis    Diffuse cystic mastopathy    Gout    Hypertension    Osteopetrosis     Surgical History:  Past Surgical History:  Procedure Laterality Date   BLADDER STONE REMOVAL  2008   BREAST EXCISIONAL BIOPSY Right    benign; many years ago   BREAST LUMPECTOMY Right 1974   positive-no radiation or chemo per pt   COLONOSCOPY  1994   LAPAROSCOPY  2013   gallstone removal   NASAL SINUS SURGERY  OOPHORECTOMY Bilateral    PARATHYROIDECTOMY N/A 08/12/2020   Procedure: PARATHYROIDECTOMY with RNFA to assist;  Surgeon: Fredirick Maudlin, MD;  Location: [facility] ORS;  Service: General;  Laterality: N/A;   TONSILLECTOMY     TOTAL ABDOMINAL HYSTERECTOMY W/ BILATERAL SALPINGOOPHORECTOMY  1980    Medications:  Current Outpatient Medications on File Prior to Visit  Medication Sig   amLODipine (NORVASC) 5 MG tablet Take 1 tablet (5 mg total) by mouth daily.   aspirin 81 MG tablet Take 81 mg by mouth daily.   Biotin 1000 MCG tablet Take 1,000 mcg by mouth daily.   calcium carbonate (OS-CAL - DOSED IN MG OF ELEMENTAL CALCIUM) 1250 (500 Ca) MG tablet Take by mouth.   donepezil (ARICEPT) 10 MG tablet TAKE 1 TABLET AT BEDTIME   memantine (NAMENDA) 10 MG tablet TAKE 1 TABLET TWICE A DAY   Misc Natural Products (OSTEO BI-FLEX ADV TRIPLE ST) TABS Take  1 tablet by mouth in the morning and at bedtime.   pravastatin (PRAVACHOL) 40 MG tablet Take 1 tablet (40 mg total) by mouth daily.   No current facility-administered medications on file prior to visit.    Allergies:  Allergies  Allergen Reactions   Augmentin [Amoxicillin-Pot Clavulanate] Nausea And Vomiting and Rash   Tetracyclines & Related Rash   Tylenol [Acetaminophen] Rash    Social History:  Social History   Socioeconomic History   Marital status: Widowed    Spouse name: Not on file   Number of children: Not on file   Years of education: Not on file   Highest education level: Not on file  Occupational History   Not on file  Tobacco Use   Smoking status: Former    Packs/day: 0.50    Types: Cigarettes    Quit date: 08/14/1990    Years since quitting: 31.8   Smokeless tobacco: Never  Vaping Use   Vaping Use: Never used  Substance and Sexual Activity   Alcohol use: No   Drug use: No   Sexual activity: Not Currently  Other Topics Concern   Not on file  Social History Narrative   Not on file   Social Determinants of Health   Financial Resource Strain: Low Risk  (05/29/2021)   Overall Financial Resource Strain (CARDIA)    Difficulty of Paying Living Expenses: Not hard at all  Food Insecurity: No Food Insecurity (05/29/2021)   Hunger Vital Sign    Worried About Running Out of Food in the Last Year: Never true    [place] in the Last Year: Never true  Transportation Needs: No Transportation Needs (05/29/2021)   PRAPARE - Hydrologist (Medical): No    Lack of Transportation (Non-Medical): No  Physical Activity: Inactive (05/29/2021)   Exercise Vital Sign    Days of Exercise per Week: 0 days    Minutes of Exercise per Session: 0 min  Stress: No Stress Concern Present (05/29/2021)   [place]    Feeling of Stress : Not at all  Social Connections: Moderately  Integrated (05/29/2021)   Social Connection and Isolation Panel [NHANES]    Frequency of Communication with Friends and Family: More than three times a week    Frequency of Social Gatherings with Friends and Family: More than three times a week    Attends Religious Services: More than 4 times per year    Active Member of [employer] or Organizations: Yes  Attends Archivist Meetings: More than 4 times per year    Marital Status: Widowed  Intimate Partner Violence: Not At Risk (05/29/2021)   Humiliation, Afraid, Rape, and Kick questionnaire    Fear of Current or Ex-Partner: No    Emotionally Abused: No    Physically Abused: No    Sexually Abused: No   Social History   Tobacco Use  Smoking Status Former   Packs/day: 0.50   Types: Cigarettes   Quit date: 08/14/1990   Years since quitting: 31.8  Smokeless Tobacco Never   Social History   Substance and Sexual Activity  Alcohol Use No    Family History:  Family History  Problem Relation Age of Onset   Kidney Stones Brother    Dementia Mother    Aneurysm Mother    Other Father        unknown medical history   Breast cancer Neg Hx     Past medical history, surgical history, medications, allergies, family history and social history reviewed with patient today and changes made to appropriate areas of the chart.   ROS  All other ROS negative except what is listed above and in the HPI.      Objective:    There were no vitals taken for this visit.  Wt Readings from Last 3 Encounters:  02/15/22 162 lb (73.5 kg)  01/19/22 163 lb 3.2 oz (74 kg)  09/26/21 168 lb 9.6 oz (76.5 kg)    No results found.  Physical Exam     05/29/2021    9:07 AM  6CIT Screen  What Year? 0 points  What month? 3 points  What time? 0 points  Count back from 20 0 points  Months in reverse 0 points  Repeat phrase 6 points  Total Score 9 points    Cognitive Testing - 6-CIT  Correct? Score   What year is it? {YES NO:22349} {Numbers;  0-4:31231} Yes = 0    No = 4  What month is it? {YES NO:22349} {Numbers; 0-4:31231} Yes = 0    No = 3  Remember:     Pia Mau, [place], [place]     What time is it? {YES NO:22349} {Numbers; 0-4:31231} Yes = 0    No = 3  Count backwards from 20 to 1 {YES NO:22349} {Numbers; 0-4:31231} Correct = 0    1 error = 2   More than 1 error = 4  Say the months of the year in reverse. {YES NO:22349} {Numbers; 0-4:31231} Correct = 0    1 error = 2   More than 1 error = 4  What address did I ask you to remember? {YES NO:22349} {NUMBERS; 0-10:5044} Correct = 0  1 error = 2    2 error = 4    3 error = 6    4 error = 8    All wrong = 10       TOTAL SCORE  {Numbers; 3-84:66599}/35   Interpretation:  {Desc; normal/abnormal:11317::"Normal"}  Normal (0-7) Abnormal (8-28)   Results for orders placed or performed in visit on 02/15/22  CULTURE, URINE COMPREHENSIVE   Specimen: Urine   UR  Result Value Ref Range   Urine Culture, Comprehensive Final report (A)    Organism ID, Bacteria Citrobacter koseri (A)    Organism ID, Bacteria Comment    ANTIMICROBIAL SUSCEPTIBILITY Comment   Microscopic Examination   Urine  Result Value Ref Range   WBC, UA >30 (A)  0 - 5 /hpf   RBC, Urine 3-10 (A) 0 - 2 /hpf   Epithelial Cells (non renal) 0-10 0 - 10 /hpf   Bacteria, UA Many (A) None seen/Few  Urinalysis, Complete  Result Value Ref Range   Specific Gravity, UA 1.020 1.005 - 1.030   pH, UA 6.0 5.0 - 7.5   Color, UA Yellow Yellow   Appearance Ur Cloudy (A) Clear   Leukocytes,UA 3+ (A) Negative   Protein,UA Trace (A) Negative/Trace   Glucose, UA Negative Negative   Ketones, UA Negative Negative   RBC, UA 1+ (A) Negative   Bilirubin, UA Negative Negative   Urobilinogen, Ur 0.2 0.2 - 1.0 mg/dL   Nitrite, UA Positive (A) Negative   Microscopic Examination See below:   Bladder Scan (Post Void Residual) in office  Result Value Ref Range   Scan Result 67m       Assessment & Plan:   Problem List Items  Addressed This Visit       Cardiovascular and Mediastinum   Essential (primary) hypertension - Primary     Endocrine   [place], [place]     Nervous and Auditory   [place] disease [place]     Genitourinary   Stage 3b chronic kidney disease ([place]     Preventative Services:  AAA screening:  Health Risk Assessment and Personalized Prevention Plan: Bone Mass Measurements: Breast Cancer Screening: CVD Screening:  Cervical Cancer Screening: Colon Cancer Screening:  Depression Screening:  Diabetes Screening:  Glaucoma Screening:  Hepatitis B vaccine: Hepatitis C screening:  HIV Screening: Flu Vaccine: Lung cancer Screening: Obesity Screening:  Pneumonia Vaccines (2): STI Screening:  Follow up plan: No follow-ups on file.   LABORATORY TESTING:  - Pap smear: {Blank sBDZHGD:92426::"STMdone","not applicable","up to date","done elsewhere"}  IMMUNIZATIONS:   - Tdap: Tetanus vaccination status reviewed: {tetanus status:315746}. - Influenza: {Blank single:19197::"Up to date","Administered today","Postponed to flu season","Refused","Given elsewhere"} - Pneumovax: {Blank single:19197::"Up to date","Administered today","Not applicable","Refused","Given elsewhere"} - Prevnar: {Blank single:19197::"Up to date","Administered today","Not applicable","Refused","Given elsewhere"} - Zostavax vaccine: {Blank single:19197::"Up to date","Administered today","Not applicable","Refused","Given elsewhere"}  SCREENING: -Mammogram: {Blank single:19197::"Up to date","Ordered today","Not applicable","Refused","Done elsewhere"}  - Colonoscopy: {Blank single:19197::"Up to date","Ordered today","Not applicable","Refused","Done elsewhere"}  - Bone Density: {Blank single:19197::"Up to date","Ordered today","Not applicable","Refused","Done elsewhere"}  -Hearing Test: {Blank single:19197::"Up to date","Ordered today","Not applicable","Refused","Done elsewhere"}  -Spirometry: {Blank  single:19197::"Up to date","Ordered today","Not applicable","Refused","Done elsewhere"}   PATIENT COUNSELING:   Advised to take 1 mg of folate supplement per day if capable of pregnancy.   Sexuality: Discussed sexually transmitted diseases, partner selection, use of condoms, avoidance of unintended pregnancy  and contraceptive alternatives.   Advised to avoid cigarette smoking.  I discussed with the patient that most people either abstain from alcohol or drink within safe limits (<=14/week and <=4 drinks/occasion for males, <=7/weeks and <= 3 drinks/occasion for females) and that the risk for alcohol disorders and other health effects rises proportionally with the number of drinks per week and how often a drinker exceeds daily limits.  Discussed cessation/primary prevention of drug use and availability of treatment for abuse.   Diet: Encouraged to adjust caloric intake to maintain  or achieve ideal body weight, to reduce intake of dietary saturated fat and total fat, to limit sodium intake by avoiding high sodium foods and not adding table salt, and to maintain adequate dietary potassium and calcium preferably from fresh fruits, vegetables, and low-fat dairy products.    stressed the importance of regular exercise  Injury prevention: Discussed safety belts, safety helmets,  smoke detector, smoking near bedding or upholstery.   Dental health: Discussed importance of regular tooth brushing, flossing, and dental visits.    NEXT PREVENTATIVE PHYSICAL DUE IN 1 YEAR. No follow-ups on file.

## 2022-06-07 ENCOUNTER — Encounter: Payer: Self-pay | Admitting: Nurse Practitioner

## 2022-06-07 ENCOUNTER — Ambulatory Visit (INDEPENDENT_AMBULATORY_CARE_PROVIDER_SITE_OTHER): Payer: Medicare Other | Admitting: Nurse Practitioner

## 2022-06-07 VITALS — BP 125/78 | HR 72 | Temp 97.7°F | Wt 154.9 lb

## 2022-06-07 DIAGNOSIS — Z23 Encounter for immunization: Secondary | ICD-10-CM | POA: Diagnosis not present

## 2022-06-07 DIAGNOSIS — Z0000 Encounter for general adult medical examination without abnormal findings: Secondary | ICD-10-CM | POA: Diagnosis not present

## 2022-06-07 DIAGNOSIS — I10 Essential (primary) hypertension: Secondary | ICD-10-CM

## 2022-06-07 DIAGNOSIS — Z7189 Other specified counseling: Secondary | ICD-10-CM

## 2022-06-07 DIAGNOSIS — N1832 Chronic kidney disease, stage 3b: Secondary | ICD-10-CM | POA: Diagnosis not present

## 2022-06-07 DIAGNOSIS — E785 Hyperlipidemia, unspecified: Secondary | ICD-10-CM

## 2022-06-07 DIAGNOSIS — M810 Age-related osteoporosis without current pathological fracture: Secondary | ICD-10-CM

## 2022-06-07 DIAGNOSIS — E210 Primary hyperparathyroidism: Secondary | ICD-10-CM | POA: Diagnosis not present

## 2022-06-07 DIAGNOSIS — G309 Alzheimer's disease, unspecified: Secondary | ICD-10-CM | POA: Diagnosis not present

## 2022-06-07 DIAGNOSIS — I129 Hypertensive chronic kidney disease with stage 1 through stage 4 chronic kidney disease, or unspecified chronic kidney disease: Secondary | ICD-10-CM | POA: Diagnosis not present

## 2022-06-07 DIAGNOSIS — F0280 Dementia in other diseases classified elsewhere without behavioral disturbance: Secondary | ICD-10-CM

## 2022-06-07 MED ORDER — AMLODIPINE BESYLATE 5 MG PO TABS: 5.0000 mg | ORAL_TABLET | Freq: Every day | ORAL | 1 refills | Status: DC

## 2022-06-07 MED ORDER — MEMANTINE HCL 10 MG PO TABS: 10.0000 mg | ORAL_TABLET | Freq: Two times a day (BID) | ORAL | 1 refills | Status: DC

## 2022-06-07 MED ORDER — DONEPEZIL HCL 10 MG PO TABS: 10.0000 mg | ORAL_TABLET | Freq: Every day | ORAL | 1 refills | Status: DC

## 2022-06-07 MED ORDER — PRAVASTATIN SODIUM 40 MG PO TABS: 40.0000 mg | ORAL_TABLET | Freq: Every day | ORAL | 1 refills | Status: DC

## 2022-06-07 NOTE — Assessment & Plan Note (Signed)
Chronic. Followed by Dr. Gabriel Carina.  Last Dexa was in December 2023.  Continue with Calcium and Vitamin D.

## 2022-06-07 NOTE — Assessment & Plan Note (Signed)
Chronic.  Controlled.  Had partial parathyroidectomy in April/May in 2022.  Followed by Dr. Gabriel Carina at [facility].  Had an appt in December.  Return to clinic in 3 months for reevaluation.  Call sooner if concerns arise.

## 2022-06-07 NOTE — Assessment & Plan Note (Signed)
A voluntary discussion about advance care planning including the explanation and discussion of advance directives was extensively discussed  with the patient for 10 minutes with patient and her friend present.  Explanation about the health care proxy and Living will was reviewed and packet with forms with explanation of how to fill them out was given.  During this discussion, the patient was able to identify a health care proxy as her son, Legrand Como and does not to wish to make changes at this time.

## 2022-06-07 NOTE — Assessment & Plan Note (Signed)
Chronic. Continue with current medication regimen.  She feels like she is doing well.  Presents with a friend to her visit today. Has not seen Neurology.  Follow up in 3 months.  Call sooner if concerns arise.

## 2022-06-07 NOTE — Assessment & Plan Note (Signed)
Chronic.  Controlled.  Continue with current medication regimen on Amlodipine '5mg'$  daily. Refill sent today.  Return to clinic in 3 months for reevaluation.  Labs ordered today.  Call sooner if concerns arise.

## 2022-06-07 NOTE — Assessment & Plan Note (Signed)
Chronic.  Controlled.  Continue with current medication regimen of Pravastatin daily.  Refills sent today.  Labs ordered today.  Return to clinic in 6 months for reevaluation.  Call sooner if concerns arise.

## 2022-06-07 NOTE — Assessment & Plan Note (Signed)
Chronic. Followed by Nephrology.  Saw Nephrology in April.  Labs ordered at visit today.  Follow up in 3 months.  Call sooner if concerns arise.

## 2022-06-08 LAB — COMPREHENSIVE METABOLIC PANEL
ALT: 8 IU/L (ref 0–32)
AST: 16 IU/L (ref 0–40)
Albumin/Globulin Ratio: 2 (ref 1.2–2.2)
Albumin: 4.2 g/dL (ref 3.7–4.7)
Alkaline Phosphatase: 65 IU/L (ref 44–121)
BUN/Creatinine Ratio: 17 (ref 12–28)
BUN: 16 mg/dL (ref 8–27)
Bilirubin Total: 0.8 mg/dL (ref 0.0–1.2)
CO2: 21 mmol/L (ref 20–29)
Calcium: 9.2 mg/dL (ref 8.7–10.3)
Chloride: 107 mmol/L — ABNORMAL HIGH (ref 96–106)
Creatinine, Ser: 0.95 mg/dL (ref 0.57–1.00)
Globulin, Total: 2.1 g/dL (ref 1.5–4.5)
Glucose: 87 mg/dL (ref 70–99)
Potassium: 4.2 mmol/L (ref 3.5–5.2)
Sodium: 143 mmol/L (ref 134–144)
Total Protein: 6.3 g/dL (ref 6.0–8.5)
eGFR: 59 mL/min/{1.73_m2} — ABNORMAL LOW (ref >59)

## 2022-06-08 LAB — LIPID PANEL
Chol/HDL Ratio: 2.3 ratio (ref 0.0–4.4)
Cholesterol, Total: 126 mg/dL (ref 100–199)
HDL: 56 mg/dL (ref >39)
LDL Chol Calc (NIH): 60 mg/dL (ref 0–99)
Triglycerides: 40 mg/dL (ref 0–149)
VLDL Cholesterol Cal: 10 mg/dL (ref 5–40)

## 2022-06-08 NOTE — Progress Notes (Signed)
Please let patient know that her lab work looks good.  Make sure to drink plenty of water.  No concerns at this time.  Follow up as discussed.

## 2022-07-09 ENCOUNTER — Telehealth: Payer: Self-pay | Admitting: Nurse Practitioner

## 2022-07-09 NOTE — Telephone Encounter (Signed)
Contacted [place] to schedule their annual wellness visit. Appointment made for 07/31/2022.  Sherol Dade; [facility] Direct Dial: [phone]

## 2022-07-17 ENCOUNTER — Telehealth: Payer: Self-pay | Admitting: Nurse Practitioner

## 2022-07-17 NOTE — Telephone Encounter (Signed)
Forms started for the patient's son. Placed in providers folder for completion.

## 2022-07-17 NOTE — Telephone Encounter (Addendum)
Pts son came by and dropped off FMLA forms (stated it really isn't FMLA it is Family Act something that [place] just passed last week) to be completed and would like to see if he could be completed by Friday since he has to fly back out to [facility] on Saturday.  Pts son would like to be called [phone] once it has been completed. Placed in providers folder for completion.

## 2022-07-20 ENCOUNTER — Telehealth: Payer: Self-pay | Admitting: Nurse Practitioner

## 2022-07-20 NOTE — Telephone Encounter (Signed)
Forms completed and placed up front for pick up.

## 2022-07-20 NOTE — Telephone Encounter (Signed)
Created in error

## 2022-07-20 NOTE — Telephone Encounter (Signed)
Called and LVM asking for patients son to please return my call.

## 2022-07-31 ENCOUNTER — Ambulatory Visit (INDEPENDENT_AMBULATORY_CARE_PROVIDER_SITE_OTHER): Payer: Medicare Other

## 2022-07-31 VITALS — Ht 66.0 in | Wt 154.0 lb

## 2022-07-31 DIAGNOSIS — Z0000 Encounter for general adult medical examination without abnormal findings: Secondary | ICD-10-CM

## 2022-07-31 NOTE — Patient Instructions (Signed)
Kaitlin [name] , Thank you for taking time to come for your Medicare Wellness Visit. I appreciate your ongoing commitment to your health goals. Please review the following plan we discussed and let me know if I can assist you in the future.   These are the goals we discussed:  Goals      DIET - EAT MORE FRUITS AND VEGETABLES     Patient Stated     No goals        This is a list of the screening recommended for you and due dates:  Health Maintenance  Topic Date Due   DTaP/Tdap/Td vaccine (1 - Tdap) Never done   Zoster (Shingles) Vaccine (1 of 2) Never done   COVID-19 Vaccine (4 - 2023-24 season) 01/12/2022   Medicare Annual Wellness Visit  07/31/2023   Pneumonia Vaccine  Completed   Flu Shot  Completed   DEXA scan (bone density measurement)  Completed   HPV Vaccine  Aged Out    Advanced directives: yes  Conditions/risks identified: none  Next appointment: Follow up in one year for your annual wellness visit 08/06/23 @ 10:45 am by phone   Preventive Care 65 Years and Older, Female Preventive care refers to lifestyle choices and visits with your health care provider that can promote health and wellness. What does preventive care include? A yearly physical exam. This is also called an annual well check. Dental exams once or twice a year. Routine eye exams. Ask your health care provider how often you should have your eyes checked. Personal lifestyle choices, including: Daily care of your teeth and gums. Regular physical activity. Eating a healthy diet. Avoiding tobacco and drug use. Limiting alcohol use. Practicing safe sex. Taking low-dose aspirin every day. Taking vitamin and mineral supplements as recommended by your health care provider. What happens during an annual well check? The services and screenings done by your health care provider during your annual well check will depend on your age, overall health, lifestyle risk factors, and family history of disease. Counseling   Your health care provider may ask you questions about your: Alcohol use. Tobacco use. Drug use. Emotional well-being. Home and relationship well-being. Sexual activity. Eating habits. History of falls. Memory and ability to understand (cognition). Work and work Statistician. Reproductive health. Screening  You may have the following tests or measurements: Height, weight, and BMI. Blood pressure. Lipid and cholesterol levels. These may be checked every 5 years, or more frequently if you are over 23 years old. Skin check. Lung cancer screening. You may have this screening every year starting at age 67 if you have a 30-pack-year history of smoking and currently smoke or have quit within the past 15 years. Fecal occult blood test (FOBT) of the stool. You may have this test every year starting at age 62. Flexible sigmoidoscopy or colonoscopy. You may have a sigmoidoscopy every 5 years or a colonoscopy every 10 years starting at age 17. Hepatitis C blood test. Hepatitis B blood test. Sexually transmitted disease (STD) testing. Diabetes screening. This is done by checking your blood sugar (glucose) after you have not eaten for a while (fasting). You may have this done every 1-3 years. Bone density scan. This is done to screen for osteoporosis. You may have this done starting at age 56. Mammogram. This may be done every 1-2 years. Talk to your health care provider about how often you should have regular mammograms. Talk with your health care provider about your test results, treatment options, and  if necessary, the need for more tests. Vaccines  Your health care provider may recommend certain vaccines, such as: Influenza vaccine. This is recommended every year. Tetanus, diphtheria, and acellular pertussis (Tdap, Td) vaccine. You may need a Td booster every 10 years. Zoster vaccine. You may need this after age 37. Pneumococcal 13-valent conjugate (PCV13) vaccine. One dose is recommended  after age 55. Pneumococcal polysaccharide (PPSV23) vaccine. One dose is recommended after age 49. Talk to your health care provider about which screenings and vaccines you need and how often you need them. This information is not intended to replace advice given to you by your health care provider. Make sure you discuss any questions you have with your health care provider. Document Released: 05/27/2015 Document Revised: 01/18/2016 Document Reviewed: 03/01/2015 Elsevier Interactive Patient Education  2017 [place] Prevention in the Home Falls can cause injuries. They can happen to people of all ages. There are many things you can do to make your home safe and to help prevent falls. What can I do on the outside of my home? Regularly fix the edges of walkways and driveways and fix any cracks. Remove anything that might make you trip as you walk through a door, such as a raised step or threshold. Trim any bushes or trees on the path to your home. Use bright outdoor lighting. Clear any walking paths of anything that might make someone trip, such as rocks or tools. Regularly check to see if handrails are loose or broken. Make sure that both sides of any steps have handrails. Any raised decks and porches should have guardrails on the edges. Have any leaves, snow, or ice cleared regularly. Use sand or salt on walking paths during winter. Clean up any spills in your garage right away. This includes oil or grease spills. What can I do in the bathroom? Use night lights. Install grab bars by the toilet and in the tub and shower. Do not use towel bars as grab bars. Use non-skid mats or decals in the tub or shower. If you need to sit down in the shower, use a plastic, non-slip stool. Keep the floor dry. Clean up any water that spills on the floor as soon as it happens. Remove soap buildup in the tub or shower regularly. Attach bath mats securely with double-sided non-slip rug tape. Do not  have throw rugs and other things on the floor that can make you trip. What can I do in the bedroom? Use night lights. Make sure that you have a light by your bed that is easy to reach. Do not use any sheets or blankets that are too big for your bed. They should not hang down onto the floor. Have a firm chair that has side arms. You can use this for support while you get dressed. Do not have throw rugs and other things on the floor that can make you trip. What can I do in the kitchen? Clean up any spills right away. Avoid walking on wet floors. Keep items that you use a lot in easy-to-reach places. If you need to reach something above you, use a strong step stool that has a grab bar. Keep electrical cords out of the way. Do not use floor polish or wax that makes floors slippery. If you must use wax, use non-skid floor wax. Do not have throw rugs and other things on the floor that can make you trip. What can I do with my stairs? Do not leave any  items on the stairs. Make sure that there are handrails on both sides of the stairs and use them. Fix handrails that are broken or loose. Make sure that handrails are as long as the stairways. Check any carpeting to make sure that it is firmly attached to the stairs. Fix any carpet that is loose or worn. Avoid having throw rugs at the top or bottom of the stairs. If you do have throw rugs, attach them to the floor with carpet tape. Make sure that you have a light switch at the top of the stairs and the bottom of the stairs. If you do not have them, ask someone to add them for you. What else can I do to help prevent falls? Wear shoes that: Do not have high heels. Have rubber bottoms. Are comfortable and fit you well. Are closed at the toe. Do not wear sandals. If you use a stepladder: Make sure that it is fully opened. Do not climb a closed stepladder. Make sure that both sides of the stepladder are locked into place. Ask someone to hold it for  you, if possible. Clearly mark and make sure that you can see: Any grab bars or handrails. First and last steps. Where the edge of each step is. Use tools that help you move around (mobility aids) if they are needed. These include: Canes. Walkers. Scooters. Crutches. Turn on the lights when you go into a dark area. Replace any light bulbs as soon as they burn out. Set up your furniture so you have a clear path. Avoid moving your furniture around. If any of your floors are uneven, fix them. If there are any pets around you, be aware of where they are. Review your medicines with your doctor. Some medicines can make you feel dizzy. This can increase your chance of falling. Ask your doctor what other things that you can do to help prevent falls. This information is not intended to replace advice given to you by your health care provider. Make sure you discuss any questions you have with your health care provider. Document Released: 02/24/2009 Document Revised: 10/06/2015 Document Reviewed: 06/04/2014 Elsevier Interactive Patient Education  2017 [employer].

## 2022-07-31 NOTE — Progress Notes (Signed)
I connected with  PEYTIN EWTON on 07/31/22 by a audio enabled telemedicine application and verified that I am speaking with the correct person using two identifiers.  Patient Location: Home  Provider Location: Office/Clinic  I discussed the limitations of evaluation and management by telemedicine. The patient expressed understanding and agreed to proceed.  Subjective:   Kaitlin [name] is a 88 y.o. female who presents for Medicare Annual (Subsequent) preventive examination.  Review of Systems     Cardiac Risk Factors include: advanced age (>21men, >20 women);hypertension     Objective:    There were no vitals filed for this visit. There is no height or weight on file to calculate BMI.     07/31/2022   11:08 AM 05/29/2021    9:03 AM 08/12/2020    6:39 AM 08/05/2020    8:41 AM  Advanced Directives  Does Patient Have a Medical Advance Directive? Yes Yes No No  Type of Paramedic of [place];Living will [place]    Does patient want to make changes to medical advance directive? No - Patient declined No - Patient declined    Copy of [place] in Chart? Yes - validated most recent copy scanned in chart (See row information) No - copy requested    Would patient like information on creating a medical advance directive?   No - Patient declined No - Patient declined    Current Medications (verified) Outpatient Encounter Medications as of 07/31/2022  Medication Sig   aspirin 81 MG tablet Take 81 mg by mouth daily.   Biotin 1000 MCG tablet Take 1,000 mcg by mouth daily.   calcium carbonate (OS-CAL - DOSED IN MG OF ELEMENTAL CALCIUM) 1250 (500 Ca) MG tablet Take by mouth.   donepezil (ARICEPT) 10 MG tablet Take 1 tablet (10 mg total) by mouth at bedtime.   memantine (NAMENDA) 10 MG tablet Take 1 tablet (10 mg total) by mouth 2 (two) times daily.   Misc Natural Products (OSTEO BI-FLEX ADV TRIPLE ST) TABS Take 1 tablet by mouth  in the morning and at bedtime.   pravastatin (PRAVACHOL) 40 MG tablet Take 1 tablet (40 mg total) by mouth daily.   amLODipine (NORVASC) 5 MG tablet Take 1 tablet (5 mg total) by mouth daily.   No facility-administered encounter medications on file as of 07/31/2022.    Allergies (verified) Augmentin [amoxicillin-pot clavulanate], Tetracyclines & related, and Tylenol [acetaminophen]   History: Past Medical History:  Diagnosis Date   Alzheimer disease ([place])    Cystitis    Diffuse cystic mastopathy    Gout    Hypertension    Osteopetrosis    Past Surgical History:  Procedure Laterality Date   BLADDER STONE REMOVAL  2008   BREAST EXCISIONAL BIOPSY Right    benign; many years ago   BREAST LUMPECTOMY Right 1974   positive-no radiation or chemo per pt   COLONOSCOPY  1994   LAPAROSCOPY  2013   gallstone removal   NASAL SINUS SURGERY     OOPHORECTOMY Bilateral    PARATHYROIDECTOMY N/A 08/12/2020   Procedure: PARATHYROIDECTOMY with RNFA to assist;  Surgeon: Fredirick Maudlin, MD;  Location: [facility] ORS;  Service: General;  Laterality: N/A;   TONSILLECTOMY     TOTAL ABDOMINAL HYSTERECTOMY W/ BILATERAL SALPINGOOPHORECTOMY  1980   Family History  Problem Relation Age of Onset   Kidney Stones Brother    Dementia Mother    Aneurysm Mother    Other Father  unknown medical history   Breast cancer Neg Hx    Social History   Socioeconomic History   Marital status: Widowed    Spouse name: Not on file   Number of children: Not on file   Years of education: Not on file   Highest education level: Not on file  Occupational History   Not on file  Tobacco Use   Smoking status: Former    Packs/day: .5    Types: Cigarettes    Quit date: 08/14/1990    Years since quitting: 31.9   Smokeless tobacco: Never  Vaping Use   Vaping Use: Never used  Substance and Sexual Activity   Alcohol use: No   Drug use: No   Sexual activity: Not Currently  Other Topics Concern   Not on file   Social History Narrative   Not on file   Social Determinants of Health   Financial Resource Strain: Low Risk  (07/31/2022)   Overall Financial Resource Strain (CARDIA)    Difficulty of Paying Living Expenses: Not hard at all  Food Insecurity: No Food Insecurity (07/31/2022)   Hunger Vital Sign    Worried About Running Out of Food in the Last Year: Never true    [place] in the Last Year: Never true  Transportation Needs: No Transportation Needs (07/31/2022)   PRAPARE - Hydrologist (Medical): No    Lack of Transportation (Non-Medical): No  Physical Activity: Insufficiently Active (07/31/2022)   Exercise Vital Sign    Days of Exercise per Week: 7 days    Minutes of Exercise per Session: 10 min  Stress: No Stress Concern Present (07/31/2022)   [place]    Feeling of Stress : Not at all  Social Connections: Moderately Integrated (07/31/2022)   Social Connection and Isolation Panel [NHANES]    Frequency of Communication with Friends and Family: More than three times a week    Frequency of Social Gatherings with Friends and Family: More than three times a week    Attends Religious Services: More than 4 times per year    Active Member of [employer] or Organizations: Yes    Attends Archivist Meetings: More than 4 times per year    Marital Status: Widowed    Tobacco Counseling Counseling given: Not Answered   Clinical Intake:  Pre-visit preparation completed: Yes  Pain : No/denies pain     Nutritional Risks: None Diabetes: No  How often do you need to have someone help you when you read instructions, pamphlets, or other written materials from your doctor or pharmacy?: 1 - Never  Diabetic?no  Interpreter Needed?: No  Information entered by :: Kirke Shaggy, LPN   Activities of Daily Living    07/31/2022   11:09 AM 06/07/2022   11:01 AM  In your present state of  health, do you have any difficulty performing the following activities:  Hearing? 0 0  Vision? 0 0  Difficulty concentrating or making decisions? 1 1  Walking or climbing stairs? 0 0  Dressing or bathing? 0 0  Doing errands, shopping? 1 1  Preparing Food and eating ? N   Using the Toilet? N   In the past six months, have you accidently leaked urine? N   Do you have problems with loss of bowel control? N   Managing your Medications? N   Managing your Finances? N   Housekeeping or managing your Housekeeping?  Y     Patient Care Team: Jon Billings, NP as PCP - General (Nurse Practitioner) Christene Lye, MD (General Surgery)  Indicate any recent Medical Services you may have received from other than [facility] providers in the past year (date may be approximate).     Assessment:   This is a routine wellness examination for [place].  Hearing/Vision screen Hearing Screening - Comments:: No aids Vision Screening - Comments:: No glasses-   Dietary issues and exercise activities discussed: Current Exercise Habits: Home exercise routine, Type of exercise: stretching, Time (Minutes): 10, Frequency (Times/Week): 7, Weekly Exercise (Minutes/Week): 70, Intensity: Mild   Goals Addressed             This Visit's Progress    DIET - EAT MORE FRUITS AND VEGETABLES         Depression Screen    07/31/2022   11:06 AM 06/07/2022   11:06 AM 01/19/2022   11:04 AM 09/26/2021    9:46 AM 06/29/2021    9:15 AM 05/29/2021    9:19 AM 03/29/2021    3:11 PM  PHQ 2/9 Scores  PHQ - 2 Score 0 0 0 0 0 0 0  PHQ- 9 Score 0 0 0 0 0  0    Fall Risk    07/31/2022   11:08 AM 06/07/2022   11:06 AM 01/19/2022   11:04 AM 09/26/2021    9:46 AM 06/29/2021    9:15 AM  Fall Risk   Falls in the past year? 0 0 0 0 0  Number falls in past yr: 0 0 0 0 0  Injury with Fall? 0 0 0 0 0  Risk for fall due to : No Fall Risks No Fall Risks No Fall Risks No Fall Risks No Fall Risks  Follow up Falls prevention  discussed;Falls evaluation completed Falls evaluation completed Falls evaluation completed Falls evaluation completed Falls evaluation completed    FALL RISK PREVENTION PERTAINING TO THE HOME:  Any stairs in or around the home? Yes  If so, are there any without handrails? No  Home free of loose throw rugs in walkways, pet beds, electrical cords, etc? Yes  Adequate lighting in your home to reduce risk of falls? Yes   ASSISTIVE DEVICES UTILIZED TO PREVENT FALLS:  Life alert? No  Use of a cane, walker or w/c? No  Grab bars in the bathroom? Yes  Shower chair or bench in shower? No  Elevated toilet seat or a handicapped toilet? No    Cognitive Function:        07/31/2022   11:12 AM 05/29/2021    9:07 AM  6CIT Screen  What Year? 0 points 0 points  What month? 0 points 3 points  What time? 0 points 0 points  Count back from 20 0 points 0 points  Months in reverse 4 points 0 points  Repeat phrase 8 points 6 points  Total Score 12 points 9 points    Immunizations Immunization History  Administered Date(s) Administered   Fluad Quad(high Dose 65+) 03/29/2021, 06/07/2022   Influenza, High Dose Seasonal PF 02/28/2018   Influenza-Unspecified 02/18/2013, 02/17/2014, 03/18/2015, 03/18/2017   PFIZER(Purple Top)SARS-COV-2 Vaccination 06/11/2019, 07/03/2019, 02/12/2020   Pneumococcal Conjugate-13 06/29/2021   Pneumococcal Polysaccharide-23 06/07/2022    TDAP status: Due, Education has been provided regarding the importance of this vaccine. Advised may receive this vaccine at local pharmacy or Health Dept. Aware to provide a copy of the vaccination record if obtained from local pharmacy or Health Dept.  Verbalized acceptance and understanding.  Flu Vaccine status: Up to date  Pneumococcal vaccine status: Up to date  Covid-19 vaccine status: Completed vaccines  Qualifies for Shingles Vaccine? Yes   Zostavax completed No   Shingrix Completed?: No.    Education has been provided  regarding the importance of this vaccine. Patient has been advised to call insurance company to determine out of pocket expense if they have not yet received this vaccine. Advised may also receive vaccine at local pharmacy or Health Dept. Verbalized acceptance and understanding.  Screening Tests Health Maintenance  Topic Date Due   DTaP/Tdap/Td (1 - Tdap) Never done   Zoster Vaccines- Shingrix (1 of 2) Never done   COVID-19 Vaccine (4 - 2023-24 season) 01/12/2022   Medicare Annual Wellness (AWV)  07/31/2023   Pneumonia Vaccine 26+ Years old  Completed   INFLUENZA VACCINE  Completed   DEXA SCAN  Completed   HPV VACCINES  Aged Out    Health Maintenance  Health Maintenance Due  Topic Date Due   DTaP/Tdap/Td (1 - Tdap) Never done   Zoster Vaccines- Shingrix (1 of 2) Never done   COVID-19 Vaccine (4 - 2023-24 season) 01/12/2022    Colorectal cancer screening: No longer required.   Mammogram status: No longer required due to age.   Lung Cancer Screening: (Low Dose CT Chest recommended if Age 51-80 years, 30 pack-year currently smoking OR have quit w/in 15years.) does not qualify.   Additional Screening:  Hepatitis C Screening: does not qualify; Completed no  Vision Screening: Recommended annual ophthalmology exams for early detection of glaucoma and other disorders of the eye. Is the patient up to date with their annual eye exam?  No  Who is the provider or what is the name of the office in which the patient attends annual eye exams? No one If pt is not established with a provider, would they like to be referred to a provider to establish care? No .   Dental Screening: Recommended annual dental exams for proper oral hygiene  Community Resource Referral / Chronic Care Management: CRR required this visit?  No   CCM required this visit?  No      Plan:     I have personally reviewed and noted the following in the patient's chart:   Medical and social history Use of  alcohol, tobacco or illicit drugs  Current medications and supplements including opioid prescriptions. Patient is not currently taking opioid prescriptions. Functional ability and status Nutritional status Physical activity Advanced directives List of other physicians Hospitalizations, surgeries, and ER visits in previous 12 months Vitals Screenings to include cognitive, depression, and falls Referrals and appointments  In addition, I have reviewed and discussed with patient certain preventive protocols, quality metrics, and best practice recommendations. A written personalized care plan for preventive services as well as general preventive health recommendations were provided to patient.     Dionisio David, LPN   [id number]   Nurse Notes: none

## 2022-09-06 ENCOUNTER — Ambulatory Visit: Payer: Medicare Other | Admitting: Nurse Practitioner

## 2022-09-11 ENCOUNTER — Ambulatory Visit (INDEPENDENT_AMBULATORY_CARE_PROVIDER_SITE_OTHER): Payer: Medicare Other | Admitting: Nurse Practitioner

## 2022-09-11 ENCOUNTER — Encounter: Payer: Self-pay | Admitting: Nurse Practitioner

## 2022-09-11 VITALS — BP 121/78 | HR 75 | Temp 98.6°F | Ht 65.98 in | Wt 170.4 lb

## 2022-09-11 DIAGNOSIS — I10 Essential (primary) hypertension: Secondary | ICD-10-CM

## 2022-09-11 DIAGNOSIS — E042 Nontoxic multinodular goiter: Secondary | ICD-10-CM | POA: Diagnosis not present

## 2022-09-11 DIAGNOSIS — G309 Alzheimer's disease, unspecified: Secondary | ICD-10-CM | POA: Diagnosis not present

## 2022-09-11 DIAGNOSIS — F0280 Dementia in other diseases classified elsewhere without behavioral disturbance: Secondary | ICD-10-CM

## 2022-09-11 DIAGNOSIS — N1832 Chronic kidney disease, stage 3b: Secondary | ICD-10-CM

## 2022-09-11 DIAGNOSIS — E210 Primary hyperparathyroidism: Secondary | ICD-10-CM | POA: Diagnosis not present

## 2022-09-11 DIAGNOSIS — R8271 Bacteriuria: Secondary | ICD-10-CM | POA: Insufficient documentation

## 2022-09-11 DIAGNOSIS — E785 Hyperlipidemia, unspecified: Secondary | ICD-10-CM

## 2022-09-11 NOTE — Progress Notes (Signed)
BP 121/78   Pulse 75   Temp 98.6 F (37 C) (Oral)   Ht 5' 5.98" (1.676 m)   Wt 170 lb 6.4 oz (77.3 kg)   SpO2 99%   BMI 27.52 kg/m    Subjective:    Patient ID: Kaitlin [name], female    DOB: 09/07/1934, 88 y.o.   MRN: [id number]  HPI: Kaitlin [name] is a 88 y.o. female  Chief Complaint  Patient presents with   Hypertension   Hyperlipidemia   Diabetes   Patient presents to clinic with her friend Olegario Messier.    HYPERTENSION Hypertension status: controlled  Satisfied with current treatment? no Duration of hypertension: years BP monitoring frequency:  not checking BP range:  BP medication side effects:  no Medication compliance: excellent compliance Previous BP meds:amlodipine Aspirin: no Recurrent headaches: no Visual changes: no Palpitations: no Dyspnea: no Chest pain: no Lower extremity edema: no Dizzy/lightheaded: no  MEMORY ISSUES Patient states she feels like her memory is doing pretty well.   Patient's son was here about a month ago per her friend.  She is followed by Dr. Sherryll Burger.  Told to follow up annually.    CHRONIC KIDNEY DISEASE Patient is followed by Nephrology.   CKD status: controlled Medications renally dose: yes Previous renal evaluation: yes Pneumovax:  Up to Date Influenza Vaccine:  Up to Date  Active Ambulatory Problems    Diagnosis Date Noted   Fibrocystic breast disease 12/18/2012   Alzheimer disease (HCC) 09/21/2013   Atrophic vaginitis 07/23/2013   Benign neoplasm of colon, unspecified 07/23/2013   Essential (primary) hypertension 07/23/2013   Gout 07/23/2013   Heart palpitations 06/23/2014   Hyperlipidemia 01/02/2017   Hyperparathyroidism, primary (HCC) 05/12/2014   Mitral regurgitation 01/02/2017   Internal derangement of left knee 01/14/2014   Nephrolithiasis 07/23/2013   Urinary incontinence 07/23/2013   Hypercalcemia 07/23/2013   Multiple thyroid nodules 05/12/2014   Osteoporosis 05/12/2014   Primary osteoarthritis of left  knee 03/23/2014   SOB (shortness of breath) on exertion 06/23/2014   History of nephrolithiasis 10/28/2018   S/P parathyroidectomy 08/16/2020   Stage 3b chronic kidney disease (HCC) 06/29/2021   Advanced care planning/counseling discussion 06/07/2022   Asymptomatic bacteriuria 09/11/2022   Resolved Ambulatory Problems    Diagnosis Date Noted   No Resolved Ambulatory Problems   Past Medical History:  Diagnosis Date   Cystitis    Diffuse cystic mastopathy    Hypertension    Osteopetrosis    Past Surgical History:  Procedure Laterality Date   BLADDER STONE REMOVAL  2008   BREAST EXCISIONAL BIOPSY Right    benign; many years ago   BREAST LUMPECTOMY Right 1974   positive-no radiation or chemo per pt   COLONOSCOPY  1994   LAPAROSCOPY  2013   gallstone removal   NASAL SINUS SURGERY     OOPHORECTOMY Bilateral    PARATHYROIDECTOMY N/A 08/12/2020   Procedure: PARATHYROIDECTOMY with RNFA to assist;  Surgeon: Duanne Guess, MD;  Location: [facility] ORS;  Service: General;  Laterality: N/A;   TONSILLECTOMY     TOTAL ABDOMINAL HYSTERECTOMY W/ BILATERAL SALPINGOOPHORECTOMY  1980   Family History  Problem Relation Age of Onset   Kidney Stones Brother    Dementia Mother    Aneurysm Mother    Other Father        unknown medical history   Breast cancer Neg Hx      Review of Systems  Eyes:  Negative for visual disturbance.  Respiratory:  Negative for cough, chest tightness and shortness of breath.   Cardiovascular:  Negative for chest pain, palpitations and leg swelling.  Neurological:  Negative for dizziness and headaches.       Memory concerns    Per HPI unless specifically indicated above     Objective:    BP 121/78   Pulse 75   Temp 98.6 F (37 C) (Oral)   Ht 5' 5.98" (1.676 m)   Wt 170 lb 6.4 oz (77.3 kg)   SpO2 99%   BMI 27.52 kg/m   Wt Readings from Last 3 Encounters:  09/11/22 170 lb 6.4 oz (77.3 kg)  07/31/22 154 lb (69.9 kg)  06/07/22 154 lb 14.4 oz (70.3  kg)    Physical Exam Vitals and nursing note reviewed.  Constitutional:      General: She is not in acute distress.    Appearance: Normal appearance. She is normal weight. She is not ill-appearing, toxic-appearing or diaphoretic.  HENT:     Head: Normocephalic.     Right Ear: External ear normal.     Left Ear: External ear normal.     Nose: Nose normal.     Mouth/Throat:     Mouth: Mucous membranes are moist.     Pharynx: Oropharynx is clear.  Eyes:     General:        Right eye: No discharge.        Left eye: No discharge.     Extraocular Movements: Extraocular movements intact.     Conjunctiva/sclera: Conjunctivae normal.     Pupils: Pupils are equal, round, and reactive to light.  Cardiovascular:     Rate and Rhythm: Normal rate and regular rhythm.     Heart sounds: No murmur heard. Pulmonary:     Effort: Pulmonary effort is normal. No respiratory distress.     Breath sounds: Normal breath sounds. No wheezing or rales.  Musculoskeletal:     Cervical back: Normal range of motion and neck supple.  Skin:    General: Skin is warm and dry.     Capillary Refill: Capillary refill takes less than 2 seconds.  Neurological:     General: No focal deficit present.     Mental Status: She is alert and oriented to person, place, and time. Mental status is at baseline.  Psychiatric:        Mood and Affect: Mood normal.        Behavior: Behavior normal.        Thought Content: Thought content normal.        Judgment: Judgment normal.     Results for orders placed or performed in visit on 06/07/22  Comp Met (CMET)  Result Value Ref Range   Glucose 87 70 - 99 mg/dL   BUN 16 8 - 27 mg/dL   Creatinine, Ser 1.61 0.57 - 1.00 mg/dL   eGFR 59 (L) >09 UE/AVW/0.98   BUN/Creatinine Ratio 17 12 - 28   Sodium 143 134 - 144 mmol/L   Potassium 4.2 3.5 - 5.2 mmol/L   Chloride 107 (H) 96 - 106 mmol/L   CO2 21 20 - 29 mmol/L   Calcium 9.2 8.7 - 10.3 mg/dL   Total Protein 6.3 6.0 - 8.5 g/dL    Albumin 4.2 3.7 - 4.7 g/dL   Globulin, Total 2.1 1.5 - 4.5 g/dL   Albumin/Globulin Ratio 2.0 1.2 - 2.2   Bilirubin Total 0.8 0.0 - 1.2 mg/dL   Alkaline Phosphatase 65 44 - 121  IU/L   AST 16 0 - 40 IU/L   ALT 8 0 - 32 IU/L  Lipid Profile  Result Value Ref Range   Cholesterol, Total 126 100 - 199 mg/dL   Triglycerides 40 0 - 149 mg/dL   HDL 56 >82 mg/dL   VLDL Cholesterol Cal 10 5 - 40 mg/dL   LDL Chol Calc (NIH) 60 0 - 99 mg/dL   Chol/HDL Ratio 2.3 0.0 - 4.4 ratio      Assessment & Plan:   Problem List Items Addressed This Visit       Cardiovascular and Mediastinum   Essential (primary) hypertension - Primary    Chronic.  Controlled.  Continue with current medication regimen on Amlodipine 5mg  daily.  Return to clinic in 3 months for reevaluation.  Labs ordered today.  Call sooner if concerns arise.        Relevant Orders   Comp Met (CMET)     Endocrine   Hyperparathyroidism, primary (HCC)    Chronic.  Controlled.  Had partial parathyroidectomy in April/May in 2022.  Followed by Dr. Tedd Sias at [facility].  Return to clinic in 3 months for reevaluation.  Call sooner if concerns arise.        Multiple thyroid nodules    Labs ordered at visit today.  Will make recommendations based on lab results.        Relevant Orders   TSH   T4, free     Nervous and Auditory   Alzheimer disease (HCC)    Chronic. Continue with current medication regimen.  She feels like she is doing well.  Presents with a friend to her visit today. Saw Dr. Sherryll Burger and was told to follow up annually.  Follow up in 3 months.  Call sooner if concerns arise.         Genitourinary   Stage 3b chronic kidney disease (HCC)    Chronic. Followed by Nephrology.  Labs ordered at visit today.  Follow up in 3 months.  Call sooner if concerns arise.          Other   Hyperlipidemia    Chronic.  Controlled.  Continue with current medication regimen of Pravastatin daily.  Labs ordered today.  Return to  clinic in 3 months for reevaluation.  Call sooner if concerns arise.        Relevant Orders   Lipid Profile   Asymptomatic bacteriuria    Followed by Urology.  Continue to follow up with specialist.        Follow up plan: Return in about 3 months (around 12/11/2022) for HTN, HLD, DM2 FU.

## 2022-09-11 NOTE — Assessment & Plan Note (Signed)
Chronic.  Controlled.  Continue with current medication regimen on Amlodipine 5mg  daily.  Return to clinic in 3 months for reevaluation.  Labs ordered today.  Call sooner if concerns arise.

## 2022-09-11 NOTE — Assessment & Plan Note (Signed)
Chronic.  Controlled.  Continue with current medication regimen of Pravastatin daily.  Labs ordered today.  Return to clinic in 3 months for reevaluation.  Call sooner if concerns arise.

## 2022-09-11 NOTE — Assessment & Plan Note (Signed)
Followed by Urology.  Continue to follow up with specialist.

## 2022-09-11 NOTE — Assessment & Plan Note (Signed)
Chronic. Followed by Nephrology.  Labs ordered at visit today.  Follow up in 3 months.  Call sooner if concerns arise.

## 2022-09-11 NOTE — Assessment & Plan Note (Signed)
Labs ordered at visit today.  Will make recommendations based on lab results.   

## 2022-09-11 NOTE — Assessment & Plan Note (Signed)
Chronic. Continue with current medication regimen.  She feels like she is doing well.  Presents with a friend to her visit today. Saw Dr. Sherryll Burger and was told to follow up annually.  Follow up in 3 months.  Call sooner if concerns arise.

## 2022-09-11 NOTE — Assessment & Plan Note (Signed)
Chronic.  Controlled.  Had partial parathyroidectomy in April/May in 2022.  Followed by Dr. Tedd Sias at [facility].  Return to clinic in 3 months for reevaluation.  Call sooner if concerns arise.

## 2022-09-12 LAB — COMPREHENSIVE METABOLIC PANEL
ALT: 16 IU/L (ref 0–32)
AST: 22 IU/L (ref 0–40)
Albumin/Globulin Ratio: 2 (ref 1.2–2.2)
Albumin: 4.5 g/dL (ref 3.7–4.7)
Alkaline Phosphatase: 86 IU/L (ref 44–121)
BUN/Creatinine Ratio: 28 (ref 12–28)
BUN: 26 mg/dL (ref 8–27)
Bilirubin Total: 0.5 mg/dL (ref 0.0–1.2)
CO2: 19 mmol/L — ABNORMAL LOW (ref 20–29)
Calcium: 9.5 mg/dL (ref 8.7–10.3)
Chloride: 103 mmol/L (ref 96–106)
Creatinine, Ser: 0.92 mg/dL (ref 0.57–1.00)
Globulin, Total: 2.3 g/dL (ref 1.5–4.5)
Glucose: 82 mg/dL (ref 70–99)
Potassium: 4.2 mmol/L (ref 3.5–5.2)
Sodium: 142 mmol/L (ref 134–144)
Total Protein: 6.8 g/dL (ref 6.0–8.5)
eGFR: 61 mL/min/{1.73_m2} (ref >59)

## 2022-09-12 LAB — LIPID PANEL
Chol/HDL Ratio: 2.2 ratio (ref 0.0–4.4)
Cholesterol, Total: 148 mg/dL (ref 100–199)
HDL: 66 mg/dL (ref >39)
LDL Chol Calc (NIH): 71 mg/dL (ref 0–99)
Triglycerides: 50 mg/dL (ref 0–149)
VLDL Cholesterol Cal: 11 mg/dL (ref 5–40)

## 2022-09-12 LAB — TSH: TSH: 1.07 u[IU]/mL (ref 0.450–4.500)

## 2022-09-12 LAB — T4, FREE: Free T4: 1.24 ng/dL (ref 0.82–1.77)

## 2022-09-12 NOTE — Progress Notes (Signed)
Please let patient know that her lab work looks good.  Kidney function is well controlled.  Thyroid labs are within normal range.  No concerns at this time.  Continue with current medication regimen.

## 2022-10-09 ENCOUNTER — Ambulatory Visit
Admission: RE | Admit: 2022-10-09 | Discharge: 2022-10-09 | Disposition: A | Discharge status: Home / Self Care | Payer: Medicare Other | Source: Ambulatory Visit | Attending: Family Medicine | Admitting: Family Medicine

## 2022-10-09 ENCOUNTER — Encounter: Payer: Self-pay | Admitting: Family Medicine

## 2022-10-09 ENCOUNTER — Ambulatory Visit (INDEPENDENT_AMBULATORY_CARE_PROVIDER_SITE_OTHER): Payer: Medicare Other | Admitting: Family Medicine

## 2022-10-09 ENCOUNTER — Ambulatory Visit
Admission: RE | Admit: 2022-10-09 | Discharge: 2022-10-09 | Disposition: A | Discharge status: Home / Self Care | Payer: Medicare Other | Attending: Family Medicine | Admitting: Family Medicine

## 2022-10-09 VITALS — BP 104/71 | HR 96 | Temp 98.6°F | Wt 158.2 lb

## 2022-10-09 DIAGNOSIS — M25532 Pain in left wrist: Secondary | ICD-10-CM

## 2022-10-09 DIAGNOSIS — M25522 Pain in left elbow: Secondary | ICD-10-CM

## 2022-10-09 MED ORDER — DICLOFENAC SODIUM 1 % EX GEL
2.0000 g | Freq: Two times a day (BID) | CUTANEOUS | 0 refills | Status: AC | PRN
Start: 2022-10-09 — End: 2022-10-23

## 2022-10-09 NOTE — Progress Notes (Signed)
BP 104/71   Pulse 96   Temp 98.6 F (37 C) (Oral)   Wt 158 lb 3.2 oz (71.8 kg)   SpO2 99%   BMI 25.55 kg/m    Subjective:    Patient ID: Kaitlin [name], female    DOB: 07/24/1934, 88 y.o.   MRN: [id number]  HPI: Kaitlin [name] is a 88 y.o. female  Chief Complaint  Patient presents with   Hand Pain    Pt states she has been having pain and swelling in her L elbow and down into her hand for the last 2 weeks. States she has not fallen or done anything to the area. States she just woke up with the pain and swelling. States the pain and swelling are both getting better every day, has been taking Aleve to help with the pain.    LEFT ELBOW/WRIST PAIN Pain and swelling in left arm and elbow radiating to her left wrist, getting better daily. She was able to put her ring around her fingers two days ago, since then swelling has decreased. She denies trauma, falls, and significant overuse of her left arm. She denies picking up heavy items with her left hand. Her friend admits her entire forearm was more swollen on Sunday. Patient states it was significantly more swollen two weeks ago.   Duration:2 weeks Involved hand: left Mechanism of injury: unknown Location: left wrist and elbow Onset: sudden , noticed after awakening Severity: 5/10  Quality: aching and sore Frequency: constant Radiation: yes Aggravating factors: Laying on it, before she could not move it.  Alleviating factors: Aleve Treatments attempted: Aleve Relief with NSAIDs?: significant started taking Aleve Sunday at dinner, has been taking it every 12 hours for 2 days now.  Weakness: yes Numbness: no Redness: no Swelling:yes Bruising: no Fevers: no   Relevant past medical, surgical, family and social history reviewed and updated as indicated. Interim medical history since our last visit reviewed. Allergies and medications reviewed and updated.  Review of Systems  Constitutional:  Negative for fever.  Respiratory:  Negative.    Cardiovascular: Negative.   Musculoskeletal:  Positive for arthralgias and joint swelling.  Skin:  Negative for color change and wound.  Neurological:  Positive for weakness. Negative for numbness.    Per HPI unless specifically indicated above     Objective:    BP 104/71   Pulse 96   Temp 98.6 F (37 C) (Oral)   Wt 158 lb 3.2 oz (71.8 kg)   SpO2 99%   BMI 25.55 kg/m   Wt Readings from Last 3 Encounters:  10/09/22 158 lb 3.2 oz (71.8 kg)  09/11/22 170 lb 6.4 oz (77.3 kg)  07/31/22 154 lb (69.9 kg)    Physical Exam Vitals and nursing note reviewed.  Constitutional:      General: She is awake. She is not in acute distress.    Appearance: Normal appearance. She is well-developed, well-groomed and overweight. She is not ill-appearing.  HENT:     Head: Normocephalic and atraumatic.     Right Ear: Hearing and external ear normal. No drainage.     Left Ear: Hearing and external ear normal. No drainage.     Nose: Nose normal.  Eyes:     General: Lids are normal.        Right eye: No discharge.        Left eye: No discharge.     Conjunctiva/sclera: Conjunctivae normal.  Cardiovascular:     Rate and Rhythm:  Normal rate and regular rhythm.     Pulses:          Radial pulses are 2+ on the right side and 2+ on the left side.       Dorsalis pedis pulses are 2+ on the right side and 2+ on the left side.       Posterior tibial pulses are 2+ on the right side and 2+ on the left side.     Heart sounds: Normal heart sounds, S1 normal and S2 normal. No murmur heard.    No gallop.  Pulmonary:     Effort: Pulmonary effort is normal. No accessory muscle usage or respiratory distress.     Breath sounds: Normal breath sounds.  Musculoskeletal:     Right elbow: Normal range of motion. No tenderness.     Left elbow: Decreased range of motion. Tenderness present.     Right wrist: No swelling or tenderness. Normal range of motion. Normal pulse.     Left wrist: Swelling and  tenderness present. Decreased range of motion. Normal pulse.     Right hand: No swelling or tenderness.     Left hand: Swelling and tenderness present.     Cervical back: Full passive range of motion without pain and normal range of motion.     Right lower leg: No edema.     Left lower leg: No edema.  Skin:    General: Skin is warm and dry.     Capillary Refill: Capillary refill takes less than 2 seconds.  Neurological:     Mental Status: She is alert and oriented to person, place, and time.     Sensory: Sensation is intact.  Psychiatric:        Attention and Perception: Attention normal.        Mood and Affect: Mood normal.        Speech: Speech normal.        Behavior: Behavior normal. Behavior is cooperative.        Thought Content: Thought content normal.     Results for orders placed or performed in visit on 09/11/22  Comp Met (CMET)  Result Value Ref Range   Glucose 82 70 - 99 mg/dL   BUN 26 8 - 27 mg/dL   Creatinine, Ser 4.09 0.57 - 1.00 mg/dL   eGFR 61 >81 XB/JYN/8.29   BUN/Creatinine Ratio 28 12 - 28   Sodium 142 134 - 144 mmol/L   Potassium 4.2 3.5 - 5.2 mmol/L   Chloride 103 96 - 106 mmol/L   CO2 19 (L) 20 - 29 mmol/L   Calcium 9.5 8.7 - 10.3 mg/dL   Total Protein 6.8 6.0 - 8.5 g/dL   Albumin 4.5 3.7 - 4.7 g/dL   Globulin, Total 2.3 1.5 - 4.5 g/dL   Albumin/Globulin Ratio 2.0 1.2 - 2.2   Bilirubin Total 0.5 0.0 - 1.2 mg/dL   Alkaline Phosphatase 86 44 - 121 IU/L   AST 22 0 - 40 IU/L   ALT 16 0 - 32 IU/L  Lipid Profile  Result Value Ref Range   Cholesterol, Total 148 100 - 199 mg/dL   Triglycerides 50 0 - 149 mg/dL   HDL 66 >56 mg/dL   VLDL Cholesterol Cal 11 5 - 40 mg/dL   LDL Chol Calc (NIH) 71 0 - 99 mg/dL   Chol/HDL Ratio 2.2 0.0 - 4.4 ratio  TSH  Result Value Ref Range   TSH 1.070 0.450 - 4.500 uIU/mL  T4, free  Result Value Ref Range   Free T4 1.24 0.82 - 1.77 ng/dL      Assessment & Plan:   Problem List Items Addressed This Visit    None Visit Diagnoses     Elbow pain, left    -  Primary   Acute, ongoing. Xray of left elbow and wrist to rule out fracture. Recommend continued use of Aleve q12 hours PRN, voltaren gel and heat if no fracture.   Relevant Medications   diclofenac Sodium (VOLTAREN ARTHRITIS PAIN) 1 % GEL   Other Relevant Orders   DG Elbow Complete Left   Wrist pain, acute, left       Relevant Medications   diclofenac Sodium (VOLTAREN ARTHRITIS PAIN) 1 % GEL   Other Relevant Orders   DG Wrist Complete Left        Follow up plan: Return in about 9 weeks (around 12/11/2022), or if symptoms worsen or fail to improve, for HTN, HLD, DM.

## 2022-10-09 NOTE — Patient Instructions (Signed)
Obtain Xray of elbow and wrist Continue Aleve every 12 hours as needed Try Over the counter Voltaren gel for pain if no fracture Try heat to wrist and elbow area

## 2022-10-11 ENCOUNTER — Telehealth: Payer: Self-pay | Admitting: Nurse Practitioner

## 2022-10-11 NOTE — Telephone Encounter (Signed)
[name] pt friend is waiting for a FU call , on DPR, as to the results of pt x-rays so she can notify son pl fu at [phone]

## 2022-10-11 NOTE — Telephone Encounter (Signed)
X-ray results are not read by radiology. Called and notified [name] of this. Routing to ordering provider for results when they are available.

## 2022-10-15 ENCOUNTER — Encounter: Payer: Self-pay | Admitting: Nurse Practitioner

## 2022-11-11 ENCOUNTER — Inpatient Hospital Stay
Admission: EM | Admit: 2022-11-11 | Discharge: 2022-11-13 | DRG: 641 | Disposition: A | Discharge status: Home Health | Payer: Medicare Other | Attending: Osteopathic Medicine | Admitting: Osteopathic Medicine

## 2022-11-11 ENCOUNTER — Emergency Department: Payer: Medicare Other

## 2022-11-11 ENCOUNTER — Other Ambulatory Visit: Payer: Self-pay

## 2022-11-11 DIAGNOSIS — Z87442 Personal history of urinary calculi: Secondary | ICD-10-CM

## 2022-11-11 DIAGNOSIS — R55 Syncope and collapse: Secondary | ICD-10-CM | POA: Diagnosis not present

## 2022-11-11 DIAGNOSIS — R61 Generalized hyperhidrosis: Secondary | ICD-10-CM | POA: Diagnosis present

## 2022-11-11 DIAGNOSIS — Z9089 Acquired absence of other organs: Secondary | ICD-10-CM

## 2022-11-11 DIAGNOSIS — Z66 Do not resuscitate: Secondary | ICD-10-CM | POA: Diagnosis present

## 2022-11-11 DIAGNOSIS — Q782 Osteopetrosis: Secondary | ICD-10-CM

## 2022-11-11 DIAGNOSIS — G309 Alzheimer's disease, unspecified: Secondary | ICD-10-CM | POA: Diagnosis present

## 2022-11-11 DIAGNOSIS — N179 Acute kidney failure, unspecified: Secondary | ICD-10-CM | POA: Diagnosis present

## 2022-11-11 DIAGNOSIS — Z9889 Other specified postprocedural states: Secondary | ICD-10-CM

## 2022-11-11 DIAGNOSIS — F0280 Dementia in other diseases classified elsewhere without behavioral disturbance: Secondary | ICD-10-CM | POA: Diagnosis present

## 2022-11-11 DIAGNOSIS — M109 Gout, unspecified: Secondary | ICD-10-CM | POA: Diagnosis present

## 2022-11-11 DIAGNOSIS — E890 Postprocedural hypothyroidism: Secondary | ICD-10-CM | POA: Diagnosis present

## 2022-11-11 DIAGNOSIS — E860 Dehydration: Secondary | ICD-10-CM | POA: Diagnosis present

## 2022-11-11 DIAGNOSIS — N1832 Chronic kidney disease, stage 3b: Secondary | ICD-10-CM | POA: Diagnosis present

## 2022-11-11 DIAGNOSIS — N390 Urinary tract infection, site not specified: Secondary | ICD-10-CM | POA: Diagnosis present

## 2022-11-11 DIAGNOSIS — E876 Hypokalemia: Principal | ICD-10-CM | POA: Diagnosis present

## 2022-11-11 DIAGNOSIS — Z881 Allergy status to other antibiotic agents status: Secondary | ICD-10-CM

## 2022-11-11 DIAGNOSIS — I498 Other specified cardiac arrhythmias: Secondary | ICD-10-CM | POA: Insufficient documentation

## 2022-11-11 DIAGNOSIS — R32 Unspecified urinary incontinence: Secondary | ICD-10-CM | POA: Diagnosis present

## 2022-11-11 DIAGNOSIS — Z87891 Personal history of nicotine dependence: Secondary | ICD-10-CM

## 2022-11-11 DIAGNOSIS — E1122 Type 2 diabetes mellitus with diabetic chronic kidney disease: Secondary | ICD-10-CM | POA: Diagnosis present

## 2022-11-11 DIAGNOSIS — Z90710 Acquired absence of both cervix and uterus: Secondary | ICD-10-CM

## 2022-11-11 DIAGNOSIS — R17 Unspecified jaundice: Secondary | ICD-10-CM | POA: Diagnosis present

## 2022-11-11 DIAGNOSIS — Z79899 Other long term (current) drug therapy: Secondary | ICD-10-CM

## 2022-11-11 DIAGNOSIS — M810 Age-related osteoporosis without current pathological fracture: Secondary | ICD-10-CM | POA: Diagnosis present

## 2022-11-11 DIAGNOSIS — N200 Calculus of kidney: Secondary | ICD-10-CM

## 2022-11-11 DIAGNOSIS — Z7982 Long term (current) use of aspirin: Secondary | ICD-10-CM

## 2022-11-11 DIAGNOSIS — Z90722 Acquired absence of ovaries, bilateral: Secondary | ICD-10-CM

## 2022-11-11 DIAGNOSIS — I10 Essential (primary) hypertension: Secondary | ICD-10-CM | POA: Diagnosis present

## 2022-11-11 DIAGNOSIS — I340 Nonrheumatic mitral (valve) insufficiency: Secondary | ICD-10-CM | POA: Diagnosis present

## 2022-11-11 DIAGNOSIS — R7989 Other specified abnormal findings of blood chemistry: Secondary | ICD-10-CM | POA: Diagnosis present

## 2022-11-11 DIAGNOSIS — E785 Hyperlipidemia, unspecified: Secondary | ICD-10-CM | POA: Diagnosis present

## 2022-11-11 DIAGNOSIS — Z888 Allergy status to other drugs, medicaments and biological substances status: Secondary | ICD-10-CM

## 2022-11-11 DIAGNOSIS — I129 Hypertensive chronic kidney disease with stage 1 through stage 4 chronic kidney disease, or unspecified chronic kidney disease: Secondary | ICD-10-CM | POA: Diagnosis present

## 2022-11-11 LAB — BASIC METABOLIC PANEL
Anion gap: 19 — ABNORMAL HIGH (ref 5–15)
BUN: 12 mg/dL (ref 8–23)
CO2: 16 mmol/L — ABNORMAL LOW (ref 22–32)
Calcium: 8.8 mg/dL — ABNORMAL LOW (ref 8.9–10.3)
Chloride: 104 mmol/L (ref 98–111)
Creatinine, Ser: 1.29 mg/dL — ABNORMAL HIGH (ref 0.44–1.00)
GFR, Estimated: 40 mL/min — ABNORMAL LOW (ref >60)
Glucose, Bld: 150 mg/dL — ABNORMAL HIGH (ref 70–99)
Potassium: 2.5 mmol/L — CL (ref 3.5–5.1)
Sodium: 139 mmol/L (ref 135–145)

## 2022-11-11 LAB — URINALYSIS, COMPLETE (UACMP) WITH MICROSCOPIC
Bacteria, UA: NONE SEEN
Bilirubin Urine: NEGATIVE
Glucose, UA: NEGATIVE mg/dL
Ketones, ur: 80 mg/dL — AB
Nitrite: NEGATIVE
Protein, ur: 30 mg/dL — AB
Specific Gravity, Urine: 1.01 (ref 1.005–1.030)
WBC, UA: >50 WBC/hpf (ref 0–5)
pH: 5 (ref 5.0–8.0)

## 2022-11-11 LAB — TROPONIN I (HIGH SENSITIVITY)
Troponin I (High Sensitivity): 5 ng/L (ref <18)
Troponin I (High Sensitivity): 5 ng/L (ref <18)

## 2022-11-11 LAB — CBC
HCT: 41.7 % (ref 36.0–46.0)
Hemoglobin: 13.5 g/dL (ref 12.0–15.0)
MCH: 28.2 pg (ref 26.0–34.0)
MCHC: 32.4 g/dL (ref 30.0–36.0)
MCV: 87.2 fL (ref 80.0–100.0)
Platelets: 349 10*3/uL (ref 150–400)
RBC: 4.78 MIL/uL (ref 3.87–5.11)
RDW: 14.9 % (ref 11.5–15.5)
WBC: 14.5 10*3/uL — ABNORMAL HIGH (ref 4.0–10.5)
nRBC: 0 % (ref 0.0–0.2)

## 2022-11-11 LAB — HEPATIC FUNCTION PANEL
ALT: 8 U/L (ref 0–44)
AST: 14 U/L — ABNORMAL LOW (ref 15–41)
Albumin: 3.5 g/dL (ref 3.5–5.0)
Alkaline Phosphatase: 59 U/L (ref 38–126)
Bilirubin, Direct: 0.3 mg/dL — ABNORMAL HIGH (ref 0.0–0.2)
Indirect Bilirubin: 1.4 mg/dL — ABNORMAL HIGH (ref 0.3–0.9)
Total Bilirubin: 1.7 mg/dL — ABNORMAL HIGH (ref 0.3–1.2)
Total Protein: 7 g/dL (ref 6.5–8.1)

## 2022-11-11 LAB — TSH: TSH: 1.298 u[IU]/mL (ref 0.350–4.500)

## 2022-11-11 LAB — CBG MONITORING, ED: Glucose-Capillary: 148 mg/dL — ABNORMAL HIGH (ref 70–99)

## 2022-11-11 LAB — MAGNESIUM: Magnesium: 2.1 mg/dL (ref 1.7–2.4)

## 2022-11-11 LAB — PHOSPHORUS: Phosphorus: 1.8 mg/dL — ABNORMAL LOW (ref 2.5–4.6)

## 2022-11-11 MED ORDER — SODIUM CHLORIDE 0.9 % IV BOLUS: 500.0000 mL | Freq: Once | INTRAVENOUS | Status: DC

## 2022-11-11 MED ORDER — POTASSIUM CHLORIDE CRYS ER 20 MEQ PO TBCR
40.0000 meq | EXTENDED_RELEASE_TABLET | Freq: Once | ORAL | Status: AC
  Administered 2022-11-11: 40 meq via ORAL
  Filled 2022-11-11: qty 2

## 2022-11-11 MED ORDER — SODIUM CHLORIDE 0.9 % IV BOLUS
1000.0000 mL | Freq: Once | INTRAVENOUS | Status: AC
  Administered 2022-11-11: 1000 mL via INTRAVENOUS

## 2022-11-11 MED ORDER — POTASSIUM CHLORIDE 10 MEQ/100ML IV SOLN
10.0000 meq | Freq: Once | INTRAVENOUS | Status: AC
  Administered 2022-11-11: 10 meq via INTRAVENOUS
  Filled 2022-11-11: qty 100

## 2022-11-11 MED ORDER — ASPIRIN 81 MG PO TBEC
81.0000 mg | DELAYED_RELEASE_TABLET | Freq: Every day | ORAL | Status: DC
  Administered 2022-11-12 – 2022-11-13 (×2): 81 mg via ORAL
  Filled 2022-11-11 (×2): qty 1

## 2022-11-11 MED ORDER — PRAVASTATIN SODIUM 20 MG PO TABS
40.0000 mg | ORAL_TABLET | Freq: Every day | ORAL | Status: DC
  Administered 2022-11-12 – 2022-11-13 (×2): 40 mg via ORAL
  Filled 2022-11-11 (×2): qty 2

## 2022-11-11 MED ORDER — POLYETHYLENE GLYCOL 3350 17 G PO PACK: 17.0000 g | PACK | Freq: Every day | ORAL | Status: DC | PRN

## 2022-11-11 MED ORDER — DONEPEZIL HCL 5 MG PO TABS
10.0000 mg | ORAL_TABLET | Freq: Every day | ORAL | Status: DC
  Administered 2022-11-11 – 2022-11-12 (×2): 10 mg via ORAL
  Filled 2022-11-11 (×2): qty 2

## 2022-11-11 MED ORDER — ENOXAPARIN SODIUM 40 MG/0.4ML IJ SOSY: 40.0000 mg | PREFILLED_SYRINGE | INTRAMUSCULAR | Status: DC

## 2022-11-11 MED ORDER — MEMANTINE HCL 5 MG PO TABS
10.0000 mg | ORAL_TABLET | Freq: Two times a day (BID) | ORAL | Status: DC
  Administered 2022-11-11 – 2022-11-13 (×4): 10 mg via ORAL
  Filled 2022-11-11 (×4): qty 2

## 2022-11-11 MED ORDER — ENOXAPARIN SODIUM 30 MG/0.3ML IJ SOSY
30.0000 mg | PREFILLED_SYRINGE | INTRAMUSCULAR | Status: DC
  Administered 2022-11-11: 30 mg via SUBCUTANEOUS
  Filled 2022-11-11: qty 0.3

## 2022-11-11 NOTE — Progress Notes (Signed)
PHARMACIST - PHYSICIAN COMMUNICATION  CONCERNING:  Enoxaparin (Lovenox) for DVT Prophylaxis    RECOMMENDATION: Patient was prescribed enoxaprin 40mg  q24 hours for VTE prophylaxis.   Filed Weights   11/11/22 1342  Weight: 67.7 kg (149 lb 4.8 oz)    Body mass index is 24.1 kg/m.  Estimated Creatinine Clearance: 29.3 mL/min (A) (by C-G formula based on SCr of 1.29 mg/dL (H)).  Patient is candidate for enoxaparin 30mg  every 24 hours based on CrCl <4ml/min or Weight <45kg  DESCRIPTION: Pharmacy has adjusted enoxaparin dose per [facility] policy.  Patient is now receiving enoxaparin 30 mg every 24 hours    Foye Deer, PharmD Clinical Pharmacist  11/11/2022 6:20 PM

## 2022-11-11 NOTE — H&P (Signed)
History and Physical    [name]:[id number] DOB: 05/26/1934 DOA: 11/11/2022  PCP: Larae Grooms, NP   Patient coming from: home  Chief Complaint: presyncope  HPI: Kaitlin [name] is a 88 y.o. female with a pertinent history of    Patient is a 88 year old female with a history of DM, HTN, HLD, parathyroid surgery in past, history of syncope at [facility] and memory issues although seems to be fairly functional at home with the help of friends from church, CKD who presents after presyncopal episode at church.  Patient states that she did not really eat that much this morning.  Maybe some toast.  But still wanted to go to church.  She was not feeling very well.  She got out of the car and made her way into the church where she was said to be profusely sweating. And sat in pew and calmed down then at end of service tried to get up and was having difficulty with weakness and presyncope.  Nurse came over and wondered if her pulse was irregular/erractic (not fast or slow) and decided to go to the ED.  States that she has passed out a few times a while ago with friend Olegario Messier at [employer] while waiting in line.  States that these times in [employer] there was no prodrome but just collapsed  At home, her late husband passed away 2 or 3 years ago and lives at home.  She does not like to turn the [facility] on but keeps the windows open, and we talked about this.  She seems to have enough food availability.  Of note, she said she did get her Prolia last week which I doubt is related.    In the ED, Initially 106/71, 97.6 temperature, 96% on room air, HR 74 but at times in the 50s.  NA 139, K2.5, CO2 16, anion gap 19, glucose 150, SCR 1.29 from baseline of 0.9.  LFTs showed hyperbilirubinemia to 1.7, indirect predominance.  WBC 14.5, Hgb 13.5, PLT 349, TSH 1.298, UA with large leukocytes but patient is asymptomatic.  WBCs in the urine is greater than 50.  Troponin is negative at 5 x 2 CXR-showed no signs of  pulmonary edema or focal pulmonary consolidation EKG showed sinus rhythm with some PACs    Review of Systems: As per HPI otherwise 10 point review of systems negative.  Other pertinents as below:  General - denies any weight change, denies any f/c HEENT - denies any stroke symptoms, denies new visual changes Cardio - denies any cp or palpitations Resp - denies recent illness, denies cough or sob GI - denies stomach bug recently, denies n/v/d GU - denies urinary changes like dysuria or frequency MSK - denies joint concerns or pains currently Skin - denies new skin lesions or rashes Neuro - denies new stroke symptoms, new numbness or weakness, does have some memory concerns Psych - denies new anxiety or depression    Past Medical History:  Diagnosis Date   Alzheimer disease (HCC)    Cystitis    Diffuse cystic mastopathy    Gout    Hypertension    Osteopetrosis     Past Surgical History:  Procedure Laterality Date   BLADDER STONE REMOVAL  2008   BREAST EXCISIONAL BIOPSY Right    benign; many years ago   BREAST LUMPECTOMY Right 1974   positive-no radiation or chemo per pt   COLONOSCOPY  1994   LAPAROSCOPY  2013   gallstone removal  NASAL SINUS SURGERY     OOPHORECTOMY Bilateral    PARATHYROIDECTOMY N/A 08/12/2020   Procedure: PARATHYROIDECTOMY with RNFA to assist;  Surgeon: Duanne Guess, MD;  Location: [facility] ORS;  Service: General;  Laterality: N/A;   TONSILLECTOMY     TOTAL ABDOMINAL HYSTERECTOMY W/ BILATERAL SALPINGOOPHORECTOMY  1980     reports that she quit smoking about 32 years ago. Her smoking use included cigarettes. She smoked an average of .5 packs per day. She has never used smokeless tobacco. She reports that she does not drink alcohol and does not use drugs.  Allergies  Allergen Reactions   Augmentin [Amoxicillin-Pot Clavulanate] Nausea And Vomiting and Rash   Tetracyclines & Related Rash   Tylenol [Acetaminophen] Rash    Family History  Problem  Relation Age of Onset   Kidney Stones Brother    Dementia Mother    Aneurysm Mother    Other Father        unknown medical history   Breast cancer Neg Hx       Prior to Admission medications   Medication Sig Start Date End Date Taking? Authorizing Provider  amLODipine (NORVASC) 5 MG tablet Take 1 tablet (5 mg total) by mouth daily. 06/07/22  Yes Larae Grooms, NP  aspirin 81 MG tablet Take 81 mg by mouth daily.   Yes [provider]  Biotin 1000 MCG tablet Take 1,000 mcg by mouth daily.   Yes [provider]  calcium carbonate (OS-CAL - DOSED IN MG OF ELEMENTAL CALCIUM) 1250 (500 Ca) MG tablet Take by mouth.   Yes [provider]  donepezil (ARICEPT) 10 MG tablet Take 1 tablet (10 mg total) by mouth at bedtime. 06/07/22  Yes Larae Grooms, NP  memantine (NAMENDA) 10 MG tablet Take 1 tablet (10 mg total) by mouth 2 (two) times daily. 06/07/22  Yes Larae Grooms, NP  Misc Natural Products (OSTEO BI-FLEX ADV TRIPLE ST) TABS Take 1 tablet by mouth in the morning and at bedtime.   Yes [provider]  pravastatin (PRAVACHOL) 40 MG tablet Take 1 tablet (40 mg total) by mouth daily. 06/07/22  Yes Larae Grooms, NP    Physical Exam: Vitals:   11/11/22 1342 11/11/22 1524 11/11/22 1815 11/11/22 2011  BP:  (!) 90/55 127/70 136/75  Pulse:  64 (!) 54 67  Resp:  16 18 18   Temp:   97.9 F (36.6 C) (!) 97.5 F (36.4 C)  TempSrc:    Oral  SpO2:  96% 100% 99%  Weight: 67.7 kg     Height: 5\' 6"  (1.676 m)       Constitutional: NAD, comfortable, repeats questions Eyes: pupils equal and reactive to light, anicteric, without injection ENMT: MMM, throat without exudates or erythema Neck: normal, supple, no masses, no thyromegaly noted Respiratory: CTAB, nwob, no cough noted Cardiovascular: rrr w/o mrg, warm extremities Abdomen: NBS, NT,   Musculoskeletal: moving all 4 extremities, strength grossly intact 5/5 in the UE and LE's, no significant LE  edema Skin: no rashes, lesions, ulcers. No induration Neurologic: CN 2-12 grossly intact. Sensation intact Psychiatric: alert but not completely oriented appearing, repeating questions.   Labs on Admission: I have personally reviewed following labs and imaging studies  CBC: Recent Labs  Lab 11/11/22 1343  WBC 14.5*  HGB 13.5  HCT 41.7  MCV 87.2  PLT 349   Basic Metabolic Panel: Recent Labs  Lab 11/11/22 1343 11/11/22 1622  NA 139  --   K 2.5*  --  CL 104  --   CO2 16*  --   GLUCOSE 150*  --   BUN 12  --   CREATININE 1.29*  --   CALCIUM 8.8*  --   MG 2.1  --   PHOS  --  1.8*   GFR: Estimated Creatinine Clearance: 29.3 mL/min (A) (by C-G formula based on SCr of 1.29 mg/dL (H)). Liver Function Tests: Recent Labs  Lab 11/11/22 1343  AST 14*  ALT 8  ALKPHOS 59  BILITOT 1.7*  PROT 7.0  ALBUMIN 3.5   No results for input(s): "LIPASE", "AMYLASE" in the last 168 hours. No results for input(s): "AMMONIA" in the last 168 hours. Coagulation Profile: No results for input(s): "INR", "PROTIME" in the last 168 hours. Cardiac Enzymes: No results for input(s): "CKTOTAL", "CKMB", "CKMBINDEX", "TROPONINI" in the last 168 hours. BNP (last 3 results) No results for input(s): "PROBNP" in the last 8760 hours. HbA1C: No results for input(s): "HGBA1C" in the last 72 hours. CBG: Recent Labs  Lab 11/11/22 1322  GLUCAP 148*   Lipid Profile: No results for input(s): "CHOL", "HDL", "LDLCALC", "TRIG", "CHOLHDL", "LDLDIRECT" in the last 72 hours. Thyroid Function Tests: Recent Labs    11/11/22 1343  TSH 1.298   Anemia Panel: No results for input(s): "VITAMINB12", "FOLATE", "FERRITIN", "TIBC", "IRON", "RETICCTPCT" in the last 72 hours. Urine analysis:    Component Value Date/Time   COLORURINE YELLOW (A) 11/11/2022 1623   APPEARANCEUR CLOUDY (A) 11/11/2022 1623   APPEARANCEUR Cloudy (A) 02/15/2022 0847   LABSPEC 1.010 11/11/2022 1623   LABSPEC 1.017 05/15/2014 1034    PHURINE 5.0 11/11/2022 1623   GLUCOSEU NEGATIVE 11/11/2022 1623   GLUCOSEU Negative 05/15/2014 1034   HGBUR SMALL (A) 11/11/2022 1623   BILIRUBINUR NEGATIVE 11/11/2022 1623   BILIRUBINUR Negative 02/15/2022 0847   BILIRUBINUR Negative 05/15/2014 1034   KETONESUR 80 (A) 11/11/2022 1623   PROTEINUR 30 (A) 11/11/2022 1623   NITRITE NEGATIVE 11/11/2022 1623   LEUKOCYTESUR LARGE (A) 11/11/2022 1623   LEUKOCYTESUR 2+ 05/15/2014 1034    Radiological Exams on Admission: DG Chest Port 1 View  Result Date: 11/11/2022 CLINICAL DATA:  Syncope, dizziness EXAM: PORTABLE CHEST 1 VIEW COMPARISON:  08/31/2011 FINDINGS: Transverse diameter of heart is increased. There are no signs of pulmonary edema or focal pulmonary consolidation. There is no pleural effusion or pneumothorax. IMPRESSION: Cardiomegaly. There are no signs of pulmonary edema or focal pulmonary consolidation. Electronically Signed   By: Ernie Avena M.D.   On: 11/11/2022 14:23    EKG: Independently reviewed.    Assessment/Plan Principal Problem:   Hypokalemia     Presyncopal event which has some flavors of orthostatic and less likely vasovagal but I think a component of soft blood pressures and will take step down on bp meds.    - Wonder if a component of donepezil and memantine but heart rate is okay - Keep on telemetry - Replace electrolytes --troponins which were negative --tsh --stay hydrated in the outpatient setting.  #leukocytosis, suspect reactive. Hyperbilirubinemia which is new, monitor in the am, patient not having any abd pain or loss of appetite. --reasonable to watch patient overnight.  hypokalemia, added on another  checking mg Hypophosphatemia - encouraged good po intake --she drinks a lot of water and talked about maybe 1 gatorade or electrolyte tablet drink per day.  AKI - 0.3 elevation of creatinine  HLD-statin, cont asa HTN-amlodipine as above memory concerns - cont donepezil,  memantine  Consider PT  Patient and/or Family  completely agreed with the plan, expressed understanding and I answered all questions.  DVT prophylaxis: Lovenox SQ Code Status: DNR after discussing with Olegario Messier who is close to patient in the room, she confirmed that she would want to be DNR. Kathlene November, her son, who lives in [place] is [place] but tells me that she would want Olegario Messier involved in decisions Family Communication: Olegario Messier, patients close friend in the room Disposition Plan: likely back home  Consults called: na   Admission status: observation for now, tele  A total of 78 minutes utilized during this admission.  Charlane Ferretti DO [facility]   If 7PM-7AM, please contact night-coverage [email or web address] Password [facility]  11/11/2022, 9:29 PM

## 2022-11-11 NOTE — ED Triage Notes (Signed)
Patient was at church when she became dizzy and weak; Her friend with her states that she started sweating profusely; Someone at church felt her pulse and said that her pulse was "irregular"

## 2022-11-11 NOTE — ED Provider Notes (Signed)
[facility] Provider Note    Event Date/Time   First MD Initiated Contact with Patient 11/11/22 1341     (approximate)  History   Chief Complaint: Near Syncope (Patient was at church when she became dizzy and weak; Her friend with her states that she started sweating profusely; Someone at church felt her pulse and said that her pulse was "irregular")  HPI  Kaitlin [name] is a 88 y.o. female with a past medical history of dementia, hypertension, gout, presents to the emergency department after a near syncopal episode.  According to the patient around 1030 this morning while walking into church she began feeling weak and dizzy, friend states she became diaphoretic but wanted to continue to go to church.  States she was in church and began feeling poorly once again, states she tried to stand up and felt dizzy like she was going to pass out so they ultimately brought the patient to the emergency department.  The friend had the patient drink grape juice prior to arrival.  Friend states the patient drinks a significant amount of water but does not really eat much.  Physical Exam   Triage Vital Signs: ED Triage Vitals  Enc Vitals Group     BP 11/11/22 1324 106/71     Pulse Rate 11/11/22 1324 74     Resp 11/11/22 1324 16     Temp 11/11/22 1324 97.6 F (36.4 C)     Temp Source 11/11/22 1324 Oral     SpO2 11/11/22 1324 96 %     Weight 11/11/22 1342 149 lb 4.8 oz (67.7 kg)     Height 11/11/22 1342 5\' 6"  (1.676 m)     Head Circumference --      Peak Flow --      Pain Score 11/11/22 1328 0     Pain Loc --      Pain Edu? --      Excl. in GC? --     Most recent vital signs: Vitals:   11/11/22 1324  BP: 106/71  Pulse: 74  Resp: 16  Temp: 97.6 F (36.4 C)  SpO2: 96%    General: Awake, no distress.  Mild hypotension around 95 systolic. CV:  Good peripheral perfusion.  Regular rate and rhythm  Resp:  Normal effort.  Equal breath sounds bilaterally.  Abd:  No  distention.  Soft, nontender.  No rebound or guarding.  ED Results / Procedures / Treatments   EKG  EKG viewed and interpreted by myself shows sinus rhythm 82 bpm the narrow QRS, normal axis, normal intervals, no concerning ST changes.  RADIOLOGY  Chest x-ray viewed and interpreted by myself shows no consolidation. Radiology is read the chest x-ray as cardiomegaly otherwise negative.   MEDICATIONS ORDERED IN ED: Medications - No data to display   IMPRESSION / MDM / ASSESSMENT AND PLAN / ED COURSE  I reviewed the triage vital signs and the nursing notes.  Patient's presentation is most consistent with acute presentation with potential threat to life or bodily function.  Patient presents emergency department after a near syncopal episode.  Friend states patient became very diaphoretic and weak, tried to stand up in church and almost passed out.  Patient's labs have resulted showing mild leukocytosis 14,000, troponin reassuringly negative however the patient's chemistry does show significant hypokalemia with a potassium of 2.5 was appears dehydrated with a bicarb of 16 and anion gap of 19.  We will IV hydrate with fluids.  Magnesium is normal.  We will replete with oral and IV potassium.  Is unclear the cause of the patient's hypokalemia friend states the patient drinks a significant mount of water but does not eat much this could be dietary.  Given the patient's weakness near syncopal episode hypokalemia and signs of dehydration with elevated anion gap will admit to the hospital service for further workup and treatment.  Patient agreeable to plan of care.  FINAL CLINICAL IMPRESSION(S) / ED DIAGNOSES   Dehydration Hypokalemia   Note:  This document was prepared using Dragon voice recognition software and may include unintentional dictation errors.   Minna Antis, MD 11/11/22 1445

## 2022-11-12 ENCOUNTER — Encounter: Payer: Self-pay | Admitting: Internal Medicine

## 2022-11-12 DIAGNOSIS — N390 Urinary tract infection, site not specified: Secondary | ICD-10-CM | POA: Diagnosis present

## 2022-11-12 DIAGNOSIS — R32 Unspecified urinary incontinence: Secondary | ICD-10-CM | POA: Diagnosis present

## 2022-11-12 DIAGNOSIS — R17 Unspecified jaundice: Secondary | ICD-10-CM | POA: Diagnosis present

## 2022-11-12 DIAGNOSIS — R55 Syncope and collapse: Secondary | ICD-10-CM | POA: Diagnosis present

## 2022-11-12 DIAGNOSIS — Q782 Osteopetrosis: Secondary | ICD-10-CM | POA: Diagnosis not present

## 2022-11-12 DIAGNOSIS — Z66 Do not resuscitate: Secondary | ICD-10-CM | POA: Diagnosis present

## 2022-11-12 DIAGNOSIS — R7989 Other specified abnormal findings of blood chemistry: Secondary | ICD-10-CM | POA: Diagnosis present

## 2022-11-12 DIAGNOSIS — F0280 Dementia in other diseases classified elsewhere without behavioral disturbance: Secondary | ICD-10-CM | POA: Diagnosis present

## 2022-11-12 DIAGNOSIS — I340 Nonrheumatic mitral (valve) insufficiency: Secondary | ICD-10-CM | POA: Diagnosis present

## 2022-11-12 DIAGNOSIS — M109 Gout, unspecified: Secondary | ICD-10-CM | POA: Diagnosis present

## 2022-11-12 DIAGNOSIS — R61 Generalized hyperhidrosis: Secondary | ICD-10-CM | POA: Diagnosis present

## 2022-11-12 DIAGNOSIS — N1832 Chronic kidney disease, stage 3b: Secondary | ICD-10-CM | POA: Diagnosis present

## 2022-11-12 DIAGNOSIS — I129 Hypertensive chronic kidney disease with stage 1 through stage 4 chronic kidney disease, or unspecified chronic kidney disease: Secondary | ICD-10-CM | POA: Diagnosis present

## 2022-11-12 DIAGNOSIS — M810 Age-related osteoporosis without current pathological fracture: Secondary | ICD-10-CM | POA: Diagnosis present

## 2022-11-12 DIAGNOSIS — E785 Hyperlipidemia, unspecified: Secondary | ICD-10-CM | POA: Diagnosis present

## 2022-11-12 DIAGNOSIS — N179 Acute kidney failure, unspecified: Secondary | ICD-10-CM | POA: Diagnosis present

## 2022-11-12 DIAGNOSIS — E860 Dehydration: Secondary | ICD-10-CM | POA: Diagnosis present

## 2022-11-12 DIAGNOSIS — Z87891 Personal history of nicotine dependence: Secondary | ICD-10-CM | POA: Diagnosis not present

## 2022-11-12 DIAGNOSIS — E890 Postprocedural hypothyroidism: Secondary | ICD-10-CM | POA: Diagnosis present

## 2022-11-12 DIAGNOSIS — E1122 Type 2 diabetes mellitus with diabetic chronic kidney disease: Secondary | ICD-10-CM | POA: Diagnosis present

## 2022-11-12 DIAGNOSIS — E876 Hypokalemia: Secondary | ICD-10-CM | POA: Diagnosis present

## 2022-11-12 DIAGNOSIS — G309 Alzheimer's disease, unspecified: Secondary | ICD-10-CM | POA: Diagnosis present

## 2022-11-12 LAB — BASIC METABOLIC PANEL
Anion gap: 8 (ref 5–15)
BUN: 16 mg/dL (ref 8–23)
CO2: 20 mmol/L — ABNORMAL LOW (ref 22–32)
Calcium: 8.8 mg/dL — ABNORMAL LOW (ref 8.9–10.3)
Chloride: 110 mmol/L (ref 98–111)
Creatinine, Ser: 1.03 mg/dL — ABNORMAL HIGH (ref 0.44–1.00)
GFR, Estimated: 53 mL/min — ABNORMAL LOW (ref >60)
Glucose, Bld: 154 mg/dL — ABNORMAL HIGH (ref 70–99)
Potassium: 3.5 mmol/L (ref 3.5–5.1)
Sodium: 138 mmol/L (ref 135–145)

## 2022-11-12 LAB — COMPREHENSIVE METABOLIC PANEL
ALT: 8 U/L (ref 0–44)
AST: 10 U/L — ABNORMAL LOW (ref 15–41)
Albumin: 2.5 g/dL — ABNORMAL LOW (ref 3.5–5.0)
Alkaline Phosphatase: 51 U/L (ref 38–126)
Anion gap: 9 (ref 5–15)
BUN: 15 mg/dL (ref 8–23)
CO2: 20 mmol/L — ABNORMAL LOW (ref 22–32)
Calcium: 8.4 mg/dL — ABNORMAL LOW (ref 8.9–10.3)
Chloride: 110 mmol/L (ref 98–111)
Creatinine, Ser: 0.82 mg/dL (ref 0.44–1.00)
GFR, Estimated: >60 mL/min (ref >60)
Glucose, Bld: 126 mg/dL — ABNORMAL HIGH (ref 70–99)
Potassium: 2.8 mmol/L — ABNORMAL LOW (ref 3.5–5.1)
Sodium: 139 mmol/L (ref 135–145)
Total Bilirubin: 0.6 mg/dL (ref 0.3–1.2)
Total Protein: 5.4 g/dL — ABNORMAL LOW (ref 6.5–8.1)

## 2022-11-12 LAB — CBC
HCT: 34.6 % — ABNORMAL LOW (ref 36.0–46.0)
Hemoglobin: 11.9 g/dL — ABNORMAL LOW (ref 12.0–15.0)
MCH: 28.3 pg (ref 26.0–34.0)
MCHC: 34.4 g/dL (ref 30.0–36.0)
MCV: 82.2 fL (ref 80.0–100.0)
Platelets: 287 10*3/uL (ref 150–400)
RBC: 4.21 MIL/uL (ref 3.87–5.11)
RDW: 14.8 % (ref 11.5–15.5)
WBC: 7 10*3/uL (ref 4.0–10.5)
nRBC: 0 % (ref 0.0–0.2)

## 2022-11-12 MED ORDER — OXYCODONE HCL 5 MG/5ML PO SOLN
5.0000 mg | ORAL | Status: DC | PRN
  Administered 2022-11-12: 5 mg via ORAL
  Filled 2022-11-12: qty 5

## 2022-11-12 MED ORDER — ENOXAPARIN SODIUM 40 MG/0.4ML IJ SOSY
40.0000 mg | PREFILLED_SYRINGE | INTRAMUSCULAR | Status: DC
  Administered 2022-11-12: 40 mg via SUBCUTANEOUS
  Filled 2022-11-12: qty 0.4

## 2022-11-12 MED ORDER — ENSURE ENLIVE PO LIQD: 237.0000 mL | Freq: Two times a day (BID) | ORAL | Status: DC

## 2022-11-12 MED ORDER — SODIUM CHLORIDE 0.9 % IV SOLN
1.0000 g | INTRAVENOUS | Status: DC
  Administered 2022-11-12: 1 g via INTRAVENOUS
  Filled 2022-11-12 (×2): qty 10

## 2022-11-12 MED ORDER — POTASSIUM CHLORIDE 10 MEQ/100ML IV SOLN
10.0000 meq | INTRAVENOUS | Status: AC
  Administered 2022-11-12 (×4): 10 meq via INTRAVENOUS
  Filled 2022-11-12 (×2): qty 100

## 2022-11-12 MED ORDER — POTASSIUM CHLORIDE CRYS ER 20 MEQ PO TBCR
40.0000 meq | EXTENDED_RELEASE_TABLET | Freq: Once | ORAL | Status: AC
  Administered 2022-11-12: 40 meq via ORAL
  Filled 2022-11-12: qty 2

## 2022-11-12 MED ORDER — ADULT MULTIVITAMIN W/MINERALS CH
1.0000 | ORAL_TABLET | Freq: Every day | ORAL | Status: DC
  Administered 2022-11-12 – 2022-11-13 (×2): 1 via ORAL
  Filled 2022-11-12 (×2): qty 1

## 2022-11-12 NOTE — Hospital Course (Signed)
Kaitlin [name] is a 88 y.o. female with a pertinent history of DM, HTN, HLD, parathyroid surgery in past, history of syncope at [facility] and memory issues although seems to be fairly functional at home with the help of friends from church, CKD who presents after presyncopal episode at church. 06/30: significantly hypokalemic, AKI - admitted to hospitalist service for treatment  07/01: K minimal improvement, replete and recheck in PM ***. PT/OT ***. Dietician ***.   Consultants:  ***  Procedures: ***      ASSESSMENT & PLAN:   Principal Problem:   Hypokalemia Active Problems:   Alzheimer disease (HCC)   Essential (primary) hypertension   Mitral regurgitation   Nephrolithiasis   Urinary incontinence   Osteoporosis   S/P parathyroidectomy   Stage 3b chronic kidney disease (HCC)  Presyncopal event more likely orthostatic and less likely vasovagal  consider effect donepezil and memantine but heart rate is appropriate  telemetry Replace electrolytes troponins were negative tsh w/ note she takes biotin which can affect lab  PT/OT *** Dietician ***   UTI - no sepsis  UA Lg Leuk, WBC >50 Ceftriaxone  UCx add-on to previously collected specimen   leukocytosis, suspect reactive vs d/t UTI - resolved Hyperbilirubinemia - resolved Monitor outpatient    Hypokalemia Hypophosphatemia - encouraged good po intake she drinks a lot of water and talked about maybe 1 gatorade or electrolyte tablet drink per day. Dietician consult    AKI - 0.3 elevation of creatinine on admission - resolved  Hydrate po   HLD- statin, cont asa HTN- amlodipine as above memory concerns - cont donepezil, memantine      DVT prophylaxis: *** Pertinent IV fluids/nutrition: *** Central lines / invasive devices: ***  Code Status: *** ACP documentation reviewed: ***  Current Admission Status: ***  TOC needs / Dispo plan: *** Barriers to discharge / significant pending items: ***

## 2022-11-12 NOTE — Consult Note (Signed)
PHARMACIST - PHYSICIAN COMMUNICATION  CONCERNING:  Enoxaparin (Lovenox) for DVT Prophylaxis    RECOMMENDATION: Patient was prescribed enoxaprin 30mg  q24 hours for VTE prophylaxis.   Filed Weights   11/11/22 1342  Weight: 67.7 kg (149 lb 4.8 oz)    Body mass index is 24.1 kg/m.  Estimated Creatinine Clearance: 46.1 mL/min (by C-G formula based on SCr of 0.82 mg/dL).  Patient is candidate for enoxaparin 40mg  every 24 hours based on CrCl >57ml/min  DESCRIPTION: Pharmacy has adjusted enoxaparin dose per [facility] policy.  Patient is now receiving enoxaparin 40 mg every 24 hours   Celene Squibb, PharmD Clinical Pharmacist 11/12/2022 2:52 PM

## 2022-11-12 NOTE — Progress Notes (Signed)
PROGRESS NOTE    Kaitlin [name]   ZOX:[id number] DOB: 1934-11-14  DOA: 11/11/2022 Date of Service: 11/12/22 PCP: Larae Grooms, NP     Brief Narrative / Hospital Course:  Kaitlin [name] is a 88 y.o. female with a pertinent history of DM, HTN, HLD, parathyroid surgery in past, history of syncope at [facility] and memory issues although seems to be fairly functional at home with the help of friends from church, CKD who presents after presyncopal episode at church. 06/30: significantly hypokalemic, AKI - admitted to hospitalist service for treatment  07/01: K minimal improvement, replete and recheck in PM. PT/OT pending eval. Dietician pending eval.   Consultants:  none  Procedures: none      ASSESSMENT & PLAN:   Principal Problem:   Hypokalemia Active Problems:   Alzheimer disease (HCC)   Essential (primary) hypertension   Mitral regurgitation   Nephrolithiasis   Urinary incontinence   Osteoporosis   S/P parathyroidectomy   Stage 3b chronic kidney disease (HCC)  Presyncopal event more likely orthostatic and less likely vasovagal may be related to UTI consider effect donepezil and memantine but heart rate is appropriate, troponins were negative telemetry Replace electrolytes TSH w/ note she takes biotin which can affect lab  PT/OT  Dietician    UTI - no sepsis  UA Lg Leuk, WBC >50 Pt reports no symptoms but given overall confusion and weakness/presyncope will treat  Ceftriaxone  UCx add-on to previously collected specimen   leukocytosis, suspect reactive vs d/t UTI - resolved Hyperbilirubinemia - resolved Monitor outpatient    Hypokalemia Replace as needed Monitor BMP  Hypocalcemia Hypophosphatemia  she drinks a lot of water and talked about maybe 1 gatorade or electrolyte tablet drink per day. Dietician consult    AKI - 0.3 elevation of creatinine on admission - resolved  Hydrate po   HLD- statin, cont asa HTN- amlodipine as above memory  concerns - cont donepezil, memantine      DVT prophylaxis: lovenox Pertinent IV fluids/nutrition: no continuous fluids, regular diet  Central lines / invasive devices: none  Code Status: DNR ACP documentation reviewed: has advanced directive on file - son is Management consultant   Current Admission Status: inpatient   TOC needs / Dispo plan: TBD may benefit from home health PT/OT eval pending Barriers to discharge / significant pending items: correction of hypokalemia             Subjective / Brief ROS:  Patient reports doing well, thinks she's been here several days though, cannot recall events leading to hospitalization  Denies CP/SOB.  Pain controlled.  Denies new weakness.  Tolerating diet.  Reports no concerns w/ urination/defecation.   Family Communication: support person [name] is at bedside on rounds, will reach out later to son who is HCPOA    Objective Findings:  Vitals:   11/12/22 0027 11/12/22 0515 11/12/22 0817 11/12/22 1156  BP: (!) 99/59 125/71 115/82 101/83  Pulse: 69 70 70 97  Resp: 18 15 16 18   Temp: (!) 97.4 F (36.3 C) 97.9 F (36.6 C) 97.6 F (36.4 C) 97.6 F (36.4 C)  TempSrc: Oral Oral    SpO2: 96% 94% 99% 99%  Weight:      Height:        Intake/Output Summary (Last 24 hours) at 11/12/2022 1354 Last data filed at 11/12/2022 1032 Gross per 24 hour  Intake 240 ml  Output --  Net 240 ml   Filed Weights   11/11/22 1342  Weight:  67.7 kg    Examination:  Physical Exam Constitutional:      General: She is not in acute distress.    Appearance: She is not ill-appearing.  Cardiovascular:     Rate and Rhythm: Normal rate and regular rhythm.  Pulmonary:     Effort: Pulmonary effort is normal.     Breath sounds: Normal breath sounds.  Abdominal:     General: Abdomen is flat. Bowel sounds are normal.     Palpations: Abdomen is soft.  Musculoskeletal:     Right lower leg: No edema.  Skin:    General: Skin is warm and dry.   Neurological:     General: No focal deficit present.     Mental Status: She is alert. Mental status is at baseline. She is disoriented.     Comments: Alert and conversational - short term memory deficits and not oriented but is otherwise coherent   Psychiatric:        Mood and Affect: Mood normal.        Behavior: Behavior normal.          Scheduled Medications:   aspirin EC  81 mg Oral Daily   donepezil  10 mg Oral QHS   enoxaparin (LOVENOX) injection  30 mg Subcutaneous Q24H   memantine  10 mg Oral BID   pravastatin  40 mg Oral Daily    Continuous Infusions:  cefTRIAXone (ROCEPHIN)  IV      PRN Medications:  polyethylene glycol  Antimicrobials from admission:  Anti-infectives (From admission, onward)    Start     Dose/Rate Route Frequency Ordered Stop   11/12/22 0900  cefTRIAXone (ROCEPHIN) 1 g in sodium chloride 0.9 % 100 mL IVPB        1 g 200 mL/hr over 30 Minutes Intravenous Every 24 hours 11/12/22 1610             Data Reviewed:  I have personally reviewed the following...  CBC: Recent Labs  Lab 11/11/22 1343 11/12/22 0452  WBC 14.5* 7.0  HGB 13.5 11.9*  HCT 41.7 34.6*  MCV 87.2 82.2  PLT 349 287    Basic Metabolic Panel: Recent Labs  Lab 11/11/22 1343 11/11/22 1622 11/12/22 0452  NA 139  --  139  K 2.5*  --  2.8*  CL 104  --  110  CO2 16*  --  20*  GLUCOSE 150*  --  126*  BUN 12  --  15  CREATININE 1.29*  --  0.82  CALCIUM 8.8*  --  8.4*  MG 2.1  --   --   PHOS  --  1.8*  --     GFR: Estimated Creatinine Clearance: 46.1 mL/min (by C-G formula based on SCr of 0.82 mg/dL). Liver Function Tests: Recent Labs  Lab 11/11/22 1343 11/12/22 0452  AST 14* 10*  ALT 8 8  ALKPHOS 59 51  BILITOT 1.7* 0.6  PROT 7.0 5.4*  ALBUMIN 3.5 2.5*    No results for input(s): "LIPASE", "AMYLASE" in the last 168 hours. No results for input(s): "AMMONIA" in the last 168 hours. Coagulation Profile: No results for input(s): "INR", "PROTIME"  in the last 168 hours. Cardiac Enzymes: No results for input(s): "CKTOTAL", "CKMB", "CKMBINDEX", "TROPONINI" in the last 168 hours. BNP (last 3 results) No results for input(s): "PROBNP" in the last 8760 hours. HbA1C: No results for input(s): "HGBA1C" in the last 72 hours. CBG: Recent Labs  Lab 11/11/22 1322  GLUCAP 148*  Lipid Profile: No results for input(s): "CHOL", "HDL", "LDLCALC", "TRIG", "CHOLHDL", "LDLDIRECT" in the last 72 hours. Thyroid Function Tests: Recent Labs    11/11/22 1343  TSH 1.298    Anemia Panel: No results for input(s): "VITAMINB12", "FOLATE", "FERRITIN", "TIBC", "IRON", "RETICCTPCT" in the last 72 hours. Most Recent Urinalysis On File:     Component Value Date/Time   COLORURINE YELLOW (A) 11/11/2022 1623   APPEARANCEUR CLOUDY (A) 11/11/2022 1623   APPEARANCEUR Cloudy (A) 02/15/2022 0847   LABSPEC 1.010 11/11/2022 1623   LABSPEC 1.017 05/15/2014 1034   PHURINE 5.0 11/11/2022 1623   GLUCOSEU NEGATIVE 11/11/2022 1623   GLUCOSEU Negative 05/15/2014 1034   HGBUR SMALL (A) 11/11/2022 1623   BILIRUBINUR NEGATIVE 11/11/2022 1623   BILIRUBINUR Negative 02/15/2022 0847   BILIRUBINUR Negative 05/15/2014 1034   KETONESUR 80 (A) 11/11/2022 1623   PROTEINUR 30 (A) 11/11/2022 1623   NITRITE NEGATIVE 11/11/2022 1623   LEUKOCYTESUR LARGE (A) 11/11/2022 1623   LEUKOCYTESUR 2+ 05/15/2014 1034   Sepsis Labs: @LABRCNTIP (procalcitonin:4,lacticidven:4) Microbiology: No results found for this or any previous visit (from the past 240 hour(s)).    Radiology Studies last 3 days: DG Chest Port 1 View  Result Date: 11/11/2022 CLINICAL DATA:  Syncope, dizziness EXAM: PORTABLE CHEST 1 VIEW COMPARISON:  08/31/2011 FINDINGS: Transverse diameter of heart is increased. There are no signs of pulmonary edema or focal pulmonary consolidation. There is no pleural effusion or pneumothorax. IMPRESSION: Cardiomegaly. There are no signs of pulmonary edema or focal pulmonary  consolidation. Electronically Signed   By: Ernie Avena M.D.   On: 11/11/2022 14:23             LOS: 0 days       Sunnie Nielsen, DO [facility] 11/12/2022, 1:54 PM    Dictation software may have been used to generate the above note. Typos may occur and escape review in typed/dictated notes. Please contact Dr Lyn Hollingshead directly for clarity if needed.  Staff may message me via secure chat in Epic  but this may not receive an immediate response,  please page me for urgent matters!  If 7PM-7AM, please contact night coverage [email or web address]

## 2022-11-12 NOTE — Progress Notes (Signed)
Initial Nutrition Assessment  DOCUMENTATION CODES:   Not applicable  INTERVENTION:   -Continue regular diet -MVI with minerals daily -Ensure Enlive po BID, each supplement provides 350 kcal and 20 grams of protein.   NUTRITION DIAGNOSIS:   Inadequate oral intake related to decreased appetite as evidenced by per patient/family report.  GOAL:   Patient will meet greater than or equal to 90% of their needs  MONITOR:   PO intake, Supplement acceptance  REASON FOR ASSESSMENT:   Consult Assessment of nutrition requirement/status, Diet education  ASSESSMENT:   Pt with a history of DM, HTN, HLD, parathyroid surgery in past, history of syncope at [facility] and memory issues although seems to be fairly functional at home with the help of friends from church, CKD who presents after presyncopal episode at church.  Pt admitted with hypokalemia and presyncopal event.   Reviewed I/O's: +240 ml x 24 hours  Spoke with pt, who was sitting in recliner chair at time of visit. Pt reports feeling a little better today. Noted pt consumed 75% of her lunch today. Pt reports decreased oral intake over the past several months; she reports she doesn't feel like or forgets to eat. She explains to this RD that her husband passed away about 3 years ago and has been grieving and trying to find her new normal while living alone. She tries to consume 3 meals per day (Breakfast: eggs and toast; Lunch: sandwich; Dinner: sandwich). She reports she often does not feel like cooking.   Pt shares that her UBW is around 135# and estimates she has lost about 35# over the past year or so. Per pt, she loses a few pounds whenever she goes to her follow-up doctor's appointments. Reviewed wt hx; pt has experienced a 12.4% wt loss over the past 3 months, which is significant for time frame.   Discussed importance of good meal and supplement intake to promote healing. Pt amenable to Ensure, stating her son has encouraged her  to try it.   Lab Results  Component Value Date   HGBA1C 5.7 09/01/2011   PTA DM medications are .   Labs reviewed: K: 2.8 (on IV supplementation), Phos: 1.8, CBGS: 148 (inpatient orders for glycemic control are ).    NUTRITION - FOCUSED PHYSICAL EXAM:  Flowsheet Row Most Recent Value  Orbital Region No depletion  Upper Arm Region Mild depletion  Thoracic and Lumbar Region No depletion  Buccal Region No depletion  Temple Region Mild depletion  Clavicle Bone Region Mild depletion  Clavicle and Acromion Bone Region Mild depletion  Scapular Bone Region No depletion  Dorsal Hand No depletion  Patellar Region No depletion  Anterior Thigh Region No depletion  Posterior Calf Region No depletion  Edema (RD Assessment) Mild  Hair Reviewed  Eyes Reviewed  Mouth Reviewed  Skin Reviewed  Nails Reviewed       Diet Order:   Diet Order             Diet regular Room service appropriate? Yes; Fluid consistency: Thin  Diet effective now                   EDUCATION NEEDS:   Education needs have been addressed  Skin:  Skin Assessment: Reviewed RN Assessment  Last BM:  11/07/22  Height:   Ht Readings from Last 1 Encounters:  11/11/22 5\' 6"  (1.676 m)    Weight:   Wt Readings from Last 1 Encounters:  11/11/22 67.7 kg    Ideal  Body Weight:  59.1 kg  BMI:  Body mass index is 24.1 kg/m.  Estimated Nutritional Needs:   Kcal:  1600-1800  Protein:  80-95 grams  Fluid:  > 1.6 L    Levada Schilling, RD, LDN, CDCES Registered Dietitian II Certified Diabetes Care and Education Specialist Please refer to [facility] for RD and/or RD on-call/weekend/after hours pager

## 2022-11-12 NOTE — Evaluation (Signed)
Occupational Therapy Evaluation Patient Details Name: Kaitlin [name] MRN: [id number] DOB: 1935-04-18 Today's Date: 11/12/2022   History of Present Illness Pt is an 88 y.o. female presenting to hospital 6/30 with c/o near syncope (pt was at church when she became dizzy and weak).  Pt admitted with hypokalemia, leukocytosis, and AKI.  PMH includes dementia, htn, gout, DM, HLD, parathyroid sx, R breast lumpectomy, nasal sinus sx, TAH.   Clinical Impression   Pt was seen for OT evaluation this date. Prior to hospital admission, pt was living alone and has church friends who help provide groceries, transportation for appointments and to church, and was preparing light meals intermittently and eating leftovers. Pt reports using weekly pill box for managing medication. Pt pleasant and oriented to self and hospital only, endorses she is unclear why she is here. Pt presents to acute OT demonstrating impaired ADL performance and functional mobility 2/2 impaired cognition, BLE weakness, impaired balance, and hx L foot pain (See OT problem list for additional functional deficits). Pt currently requires CGA for ADL transfers, RW and CGA for ADL mobility, CGA in standing at sink for grooming tasks, and CGA - MIN A for LB dressing. VC during session for safety, RW mgt. Pt educated in cognitive compensatory strategies for meal mgt, falls prevention. Pt would benefit from skilled OT services to address noted impairments and functional limitations (see below for any additional details) in order to maximize safety and independence while minimizing falls risk and caregiver burden.    Recommendations for follow up therapy are one component of a multi-disciplinary discharge planning process, led by the attending physician.  Recommendations may be updated based on patient status, additional functional criteria and insurance authorization.   Assistance Recommended at Discharge Frequent or constant Supervision/Assistance   Patient can return home with the following A little help with walking and/or transfers;A little help with bathing/dressing/bathroom;Assistance with cooking/housework;Assist for transportation;Help with stairs or ramp for entrance;Direct supervision/assist for medications management    Functional Status Assessment  Patient has had a recent decline in their functional status and demonstrates the ability to make significant improvements in function in a reasonable and predictable amount of time.  Equipment Recommendations  None recommended by OT    Recommendations for Other Services       Precautions / Restrictions Precautions Precautions: Fall Restrictions Weight Bearing Restrictions: No      Mobility Bed Mobility               General bed mobility comments: NT, in recliner at start and end of session    Transfers Overall transfer level: Needs assistance Equipment used: Rolling walker (2 wheels) Transfers: Sit to/from Stand Sit to Stand: Min guard           General transfer comment: Pt attempted to stand prior to having RW in front of her, reaches for window sill for stability, cues for RW/safety      Balance Overall balance assessment: Needs assistance Sitting-balance support: No upper extremity supported, Feet supported Sitting balance-Leahy Scale: Good     Standing balance support: No upper extremity supported, During functional activity, Single extremity supported Standing balance-Leahy Scale: Fair Standing balance comment: tolerated standing inside RW frame at sink for grooming tasks with intermittent UE support on the RW versus countertop                           ADL either performed or assessed with clinical judgement   ADL Overall  ADL's : Needs assistance/impaired                                       General ADL Comments: Pt currently requires CGA for standing grooming tasks at the sink, MIN A for LB ADL tasks, and CGA  for ADL mobility with RW     Vision         Perception     Praxis      Pertinent Vitals/Pain Pain Assessment Faces Pain Scale: Hurts a little bit Pain Location: L knee and foot (chronic per pt report) Pain Descriptors / Indicators: Grimacing, Guarding Pain Intervention(s): Limited activity within patient's tolerance, Monitored during session, Repositioned, Patient requesting pain meds-RN notified     Hand Dominance     Extremity/Trunk Assessment Upper Extremity Assessment Upper Extremity Assessment: Overall WFL for tasks assessed   Lower Extremity Assessment Lower Extremity Assessment: Generalized weakness   Cervical / Trunk Assessment Cervical / Trunk Assessment: Normal   Communication Communication Communication: No difficulties   Cognition Arousal/Alertness: Awake/alert Behavior During Therapy: WFL for tasks assessed/performed Overall Cognitive Status: No family/caregiver present to determine baseline cognitive functioning                                 General Comments: Pt demo's decreased STM, oriented to self and place, requires VC during session for safety     General Comments  Pt noted with L knee sleeve and R ankle/foot wrap donned (pt reports d/t prior "sports injuries")    Exercises Other Exercises Other Exercises: Pt educated in cognitive compensatory strategies for meal mgt, falls prevention   Shoulder Instructions      Home Living Family/patient expects to be discharged to:: Private residence Living Arrangements: Alone Available Help at Discharge: Friend(s);Available PRN/intermittently (Pt reports having several church friends who assist her) Type of Home: House Home Access: Stairs to enter [employer] of Steps: 2 plus 1 step Entrance Stairs-Rails: Right Home Layout: One level     Bathroom Shower/Tub: Producer, television/film/video: Handicapped height     Home Equipment: Grab bars - toilet;Grab bars -  tub/shower;Rolling Walker (2 wheels);Cane - single point;Hand held shower head          Prior Functioning/Environment Prior Level of Function : Independent/Modified Independent             Mobility Comments: Pt reports being independent with ambulation; no recent falls reported. ADLs Comments: Pt notes independent with basic ADL, uses weekly pill box for medications, reports making meals intermittently with leftovers and has church friends who assist with meals, transportation, groceries. Pt endorses drinking a lot of water but doesn't eat much and has difficulty expresses the reason behind this.        OT Problem List: Decreased strength;Decreased cognition;Decreased safety awareness;Decreased activity tolerance;Impaired balance (sitting and/or standing);Decreased knowledge of use of DME or AE      OT Treatment/Interventions: Self-care/ADL training;Therapeutic exercise;Therapeutic activities;Cognitive remediation/compensation;DME and/or AE instruction;Patient/family education;Balance training    OT Goals(Current goals can be found in the care plan section) Acute Rehab OT Goals Patient Stated Goal: go home OT Goal Formulation: With patient Time For Goal Achievement: 11/26/22 Potential to Achieve Goals: Good ADL Goals Pt Will Perform Lower Body Dressing: with supervision;sit to/from stand Pt Will Transfer to Toilet: with supervision;ambulating (LRAD) Additional ADL Goal #1: Pt  will utilize learned cognitive compensatory strategies to improve safety with ADL/IADL, 3/3 opportunities with PRN VC. Additional ADL Goal #2: Pt will complete ADL task requiring <3 VC for safety, 5/5 opportunities.  OT Frequency: Min 1X/week    Co-evaluation              AM-PAC OT "6 Clicks" Daily Activity     Outcome Measure Help from another person eating meals?: None Help from another person taking care of personal grooming?: A Little Help from another person toileting, which includes using  toliet, bedpan, or urinal?: A Little Help from another person bathing (including washing, rinsing, drying)?: A Little Help from another person to put on and taking off regular upper body clothing?: None Help from another person to put on and taking off regular lower body clothing?: A Little 6 Click Score: 20   End of Session Equipment Utilized During Treatment: Rolling walker (2 wheels) Nurse Communication: Mobility status;Patient requests pain meds  Activity Tolerance: Patient tolerated treatment well Patient left: in chair;with call bell/phone within reach;with chair alarm set  OT Visit Diagnosis: Other abnormalities of gait and mobility (R26.89);Muscle weakness (generalized) (M62.81)                Time: 1410-1433 OT Time Calculation (min): 23 min Charges:  OT General Charges $OT Visit: 1 Visit OT Evaluation $OT Eval Low Complexity: 1 Low OT Treatments $Self Care/Home Management : 8-22 mins  Arman Filter., MPH, MS, OTR/L ascom [phone] 11/12/22, 3:09 PM

## 2022-11-12 NOTE — Evaluation (Signed)
Physical Therapy Evaluation Patient Details Name: Kaitlin [name] MRN: [id number] DOB: 1934-08-18 Today's Date: 11/12/2022  History of Present Illness  Pt is an 88 y.o. female presenting to hospital 6/30 with c/o near syncope (pt was at church when she became dizzy and weak).  Pt admitted with hypokalemia, leukocytosis, and AKI.  PMH includes dementia, htn, gout, DM, HLD, parathyroid sx, R breast lumpectomy, nasal sinus sx, TAH.  Clinical Impression  Prior to hospital admission, pt reports being independent with ambulation; lives alone in 1 level home with steps to enter; no recent falls reported.  Pt reporting L knee and L foot pain during session (pt reports chronic d/t prior "sports injuries")--pt noted to be wearing L knee sleeve and R ankle/foot wrap (although pt not c/o any R ankle/foot pain during session).  Pt oriented to person, DOB, and hospital only.  Currently pt is SBA semi-supine to sitting edge of bed; min assist progressing to CGA with transfers once using RW; and CGA to ambulate 25 feet x2 with RW use (pt requiring RW use d/t L LE pain).  Pt with antalgic gait (d/t L LE pain) but steady ambulating with RW use.  Pt would currently benefit from skilled PT to address noted impairments and functional limitations (see below for any additional details).  Upon hospital discharge, pt would benefit from ongoing therapy.    Assistance Recommended at Discharge Intermittent Supervision/Assistance  If plan is discharge home, recommend the following:  Can travel by private vehicle  A little help with walking and/or transfers;A little help with bathing/dressing/bathroom;Assistance with cooking/housework;Assist for transportation;Help with stairs or ramp for entrance        Equipment Recommendations Rolling walker (2 wheels)  Recommendations for Other Services       Functional Status Assessment Patient has had a recent decline in their functional status and demonstrates the ability to make  significant improvements in function in a reasonable and predictable amount of time.     Precautions / Restrictions Precautions Precautions: Fall Restrictions Weight Bearing Restrictions: No      Mobility  Bed Mobility Overal bed mobility: Needs Assistance Bed Mobility: Supine to Sit     Supine to sit: Supervision, HOB elevated     General bed mobility comments: Semi-supine to sitting edge of bed with mild increased effort to perform on own    Transfers Overall transfer level: Needs assistance Equipment used: None Transfers: Sit to/from Stand, Bed to chair/wheelchair/BSC Sit to Stand: Min guard   Step pivot transfers: Min assist, Min guard       General transfer comment: CGA to stand from bed x1 trial (pt holding onto bedrail for support) and CGA to stand from recliner and toilet (with R grab bar use) with RW; min assist stand step turn bed to recliner no AD use (pt holding onto therapist and bed rail for support); initial vc's for UE placement    Ambulation/Gait Ambulation/Gait assistance: Min guard Gait Distance (Feet):  (25 feet x2) Assistive device: Rolling walker (2 wheels)   Gait velocity: decreased     General Gait Details: antalgic; decreased stance time L LE; steady with RW use  Stairs            Wheelchair Mobility     Tilt Bed    Modified Rankin (Stroke Patients Only)       Balance Overall balance assessment: Needs assistance Sitting-balance support: No upper extremity supported, Feet supported Sitting balance-Leahy Scale: Good Sitting balance - Comments: steady reaching within BOS  Standing balance support: Bilateral upper extremity supported, During functional activity, Reliant on assistive device for balance Standing balance-Leahy Scale: Good Standing balance comment: steady ambulating with RW use; steady managing underwear for toilet and washing hands at sink                             Pertinent Vitals/Pain Pain  Assessment Pain Assessment: Faces Pain Location: L knee and foot (chronic per pt report) Pain Descriptors / Indicators: Sore Pain Intervention(s): Limited activity within patient's tolerance, Monitored during session, Repositioned Vitals (HR and SpO2 on room air) stable and WFL throughout treatment session.    Home Living Family/patient expects to be discharged to:: Private residence Living Arrangements: Alone   Type of Home: House Home Access: Stairs to enter Entrance Stairs-Rails: Right Entrance Stairs-Number of Steps: 2 plus 1 step   Home Layout: One level Home Equipment: Grab bars - toilet;Grab bars - tub/shower;Rolling Walker (2 wheels);Cane - single point;Hand held shower head      Prior Function Prior Level of Function : Independent/Modified Independent             Mobility Comments: Pt reports being independent with ambulation; no recent falls reported.       Hand Dominance        Extremity/Trunk Assessment   Upper Extremity Assessment Upper Extremity Assessment: Overall WFL for tasks assessed    Lower Extremity Assessment Lower Extremity Assessment: Generalized weakness    Cervical / Trunk Assessment Cervical / Trunk Assessment: Normal  Communication   Communication: No difficulties  Cognition Arousal/Alertness: Awake/alert Behavior During Therapy: WFL for tasks assessed/performed Overall Cognitive Status: No family/caregiver present to determine baseline cognitive functioning                                 General Comments: Oriented to person and hospital only; pt reported it was September and did not know why she was at the hospital        General Comments General comments (skin integrity, edema, etc.): Pt noted with L knee sleeve and R ankle/foot wrap donned (pt reports d/t prior "sports injuries").  Nursing cleared pt for participation in physical therapy.  Pt agreeable to PT session.    Exercises     Assessment/Plan    PT  Assessment Patient needs continued PT services  PT Problem List Decreased strength;Decreased activity tolerance;Decreased balance;Decreased mobility;Decreased knowledge of use of DME;Decreased knowledge of precautions;Pain       PT Treatment Interventions DME instruction;Gait training;Stair training;Functional mobility training;Therapeutic activities;Therapeutic exercise;Balance training;Patient/family education    PT Goals (Current goals can be found in the Care Plan section)  Acute Rehab PT Goals Patient Stated Goal: to improve strength and walking PT Goal Formulation: With patient Time For Goal Achievement: 11/26/22 Potential to Achieve Goals: Good    Frequency Min 3X/week     Co-evaluation               AM-PAC PT "6 Clicks" Mobility  Outcome Measure Help needed turning from your back to your side while in a flat bed without using bedrails?: None Help needed moving from lying on your back to sitting on the side of a flat bed without using bedrails?: A Little Help needed moving to and from a bed to a chair (including a wheelchair)?: A Little Help needed standing up from a chair using your arms (e.g., wheelchair or bedside  chair)?: A Little Help needed to walk in hospital room?: A Little Help needed climbing 3-5 steps with a railing? : A Little 6 Click Score: 19    End of Session Equipment Utilized During Treatment: Gait belt Activity Tolerance: Patient tolerated treatment well Patient left: in chair;with call bell/phone within reach;with chair alarm set;with nursing/sitter in room Nurse Communication: Mobility status;Precautions;Other (comment) (Pt's L LE pain) PT Visit Diagnosis: Other abnormalities of gait and mobility (R26.89);Muscle weakness (generalized) (M62.81);Pain Pain - Right/Left: Left Pain - part of body: Knee;Ankle and joints of foot    Time: 1610-9604 PT Time Calculation (min) (ACUTE ONLY): 21 min   Charges:   PT Evaluation $PT Eval Low Complexity: 1  Low PT Treatments $Gait Training: 8-22 mins PT General Charges $$ ACUTE PT VISIT: 1 Visit        Hendricks Limes, PT 11/12/22, 2:30 PM

## 2022-11-13 DIAGNOSIS — I498 Other specified cardiac arrhythmias: Secondary | ICD-10-CM | POA: Insufficient documentation

## 2022-11-13 LAB — URINE CULTURE

## 2022-11-13 MED ORDER — CEPHALEXIN 500 MG PO CAPS: 500.0000 mg | ORAL_CAPSULE | Freq: Three times a day (TID) | ORAL | 0 refills | Status: AC

## 2022-11-13 NOTE — TOC Transition Note (Signed)
Transition of Care [facility]) - CM/SW Discharge Note   Patient Details  Name: Kaitlin [name] MRN: [id number] Date of Birth: 02-15-1935  Transition of [facility]) CM/SW Contact:  Allena Katz, LCSW Phone Number: 11/13/2022, 10:54 AM   Clinical Narrative:   Per MD, pt is now agreeable to [facility]. Pt would like CSW to contact local friend cathy. CSW spoke with [name] who reports she has no preference on agency. Referral accepted and given to [facility] with [facility] for PT/OT.             Patient Goals and CMS Choice      Discharge Placement                         Discharge Plan and Services Additional resources added to the After Visit Summary for                                       Social Determinants of Health (SDOH) Interventions SDOH Screenings   Food Insecurity: No Food Insecurity (11/11/2022)  Housing: Low Risk  (11/11/2022)  Transportation Needs: No Transportation Needs (11/11/2022)  Utilities: Not At Risk (11/11/2022)  Alcohol Screen: Low Risk  (07/31/2022)  Depression (PHQ2-9): Low Risk  (09/11/2022)  Financial Resource Strain: Low Risk  (07/31/2022)  Physical Activity: Insufficiently Active (07/31/2022)  Social Connections: Moderately Integrated (07/31/2022)  Stress: No Stress Concern Present (07/31/2022)  Tobacco Use: Medium Risk (11/12/2022)     Readmission Risk Interventions     No data to display

## 2022-11-13 NOTE — Discharge Instructions (Signed)
Estimated Nutritional Needs per day:  Kcal:  1600-1800 Protein:  80-95 grams Fluid:  > 1.6 L

## 2022-11-13 NOTE — Discharge Summary (Signed)
Physician Discharge Summary   Patient: Kaitlin [name] MRN: [id number]  DOB: June 20, 1934   Admit:     Date of Admission: 11/11/2022 Admitted from: home   Discharge: Date of discharge: 11/13/22 Disposition: Home health Condition at discharge: good  CODE STATUS: DNR     Discharge Physician: Sunnie Nielsen, DO [facility]     PCP: Larae Grooms, NP  Recommendations for Outpatient Follow-up:  Follow up with PCP Larae Grooms, NP in 1-2 weeks Please obtain labs/tests: BMP, CBC in 1-2 weeks Please follow up on the following pending results: none PCP AND OTHER OUTPATIENT PROVIDERS: SEE BELOW FOR SPECIFIC DISCHARGE INSTRUCTIONS PRINTED FOR PATIENT IN ADDITION TO GENERIC AVS PATIENT INFO    Discharge Instructions     Diet general   Complete by: As directed    Increase activity slowly   Complete by: As directed          Discharge Diagnoses: Principal Problem:   Hypokalemia Active Problems:   Alzheimer disease (HCC)   Essential (primary) hypertension   Mitral regurgitation   Nephrolithiasis   Urinary incontinence   Osteoporosis   S/P parathyroidectomy   Stage 3b chronic kidney disease (HCC)   Sinus arrhythmia seen on electrocardiogram       Hospital Course: Kaitlin [name] is a 88 y.o. female with a pertinent history of DM, HTN, HLD, parathyroid surgery in past, history of syncope at [facility] and memory issues although seems to be fairly functional at home with the help of friends from church, CKD who presents after presyncopal episode at church. 06/30: significantly hypokalemic, AKI - admitted to hospitalist service for treatment  07/01: K minimal improvement, continue supplementation. PT/OT recs for [facility]. Dietician recs copied to AVS.  07/02: K improved, pt eager fr discharge home. Spoke w/ friend Olegario Messier at bedside - pt could benefit from home health but she sometimes will not answer the phone if number is not known - HH ok to call Olegario Messier if  needed or pt's son. Pt stable for d/c to follow closely w/ PCP for lab recheck   Consultants:  none  Procedures: none      ASSESSMENT & PLAN:   Presyncopal event more likely orthostatic and less likely vasovagal  consider effect donepezil and memantine but heart rate is appropriate  telemetry Replace electrolytes troponins were negative tsh w/ note she takes biotin which can affect lab  PT/OT recs for [facility] Dietician recs to AVS - friend is arranging Meals on Wheels also    UTI - no sepsis  UA Lg Leuk, WBC >50 UCx multiple species but minimal epithelial cells on UA  Ceftriaxone --> Keflex on discharge Follow outpatient  leukocytosis, suspect reactive vs d/t UTI - resolved Hyperbilirubinemia - resolved Monitor outpatient    Hypokalemia Hypophosphatemia - encouraged good po intake she drinks a lot of water and talked about maybe 1 gatorade or electrolyte tablet drink per day. Dietician consult as above   AKI - 0.3 elevation of creatinine on admission - resolved  Hydrate po   HLD- statin, cont asa HTN- amlodipine as above memory concerns - cont donepezil, memantine             Discharge Instructions  Allergies as of 11/13/2022       Reactions   Augmentin [amoxicillin-pot Clavulanate] Nausea And Vomiting, Rash   Tetracyclines & Related Rash   Tylenol [acetaminophen] Rash        Medication List     STOP taking these medications  amLODipine 5 MG tablet Commonly known as: NORVASC       TAKE these medications    aspirin 81 MG tablet Take 81 mg by mouth daily.   Biotin 1000 MCG tablet Take 1,000 mcg by mouth daily.   calcium carbonate 1250 (500 Ca) MG tablet Commonly known as: OS-CAL - dosed in mg of elemental calcium Take by mouth.   cephALEXin 500 MG capsule Commonly known as: KEFLEX Take 1 capsule (500 mg total) by mouth 3 (three) times daily for 4 days.   donepezil 10 MG tablet Commonly known as: ARICEPT Take 1 tablet (10 mg  total) by mouth at bedtime.   memantine 10 MG tablet Commonly known as: NAMENDA Take 1 tablet (10 mg total) by mouth 2 (two) times daily.   Osteo Bi-Flex Adv Triple St Tabs Take 1 tablet by mouth in the morning and at bedtime.   pravastatin 40 MG tablet Commonly known as: PRAVACHOL Take 1 tablet (40 mg total) by mouth daily.         Follow-up Information     Larae Grooms, NP. Go on 11/20/2022.   Specialty: Nurse Practitioner Why: @ 11am  follow up to recheck labs no later than 1 week - hospital follow-up Contact information: [address] [place] [address] [phone]                 Allergies  Allergen Reactions   Augmentin [Amoxicillin-Pot Clavulanate] Nausea And Vomiting and Rash   Tetracyclines & Related Rash   Tylenol [Acetaminophen] Rash     Subjective: pt reports feeling well this morning and has no complaints. Per friend at bedside, patient is at baseline    Discharge Exam: BP 121/84 (BP Location: Right Arm)   Pulse 88   Temp 98.2 F (36.8 C)   Resp 20   Ht 5\' 6"  (1.676 m)   Wt 67.7 kg   SpO2 90%   BMI 24.10 kg/m  General: Pt is alert, awake, not in acute distress Cardiovascular: RRR, S1/S2 +faint systolic murmur, no rubs, no gallops Respiratory: CTA bilaterally, no wheezing, no rhonchi Abdominal: Soft, NT, ND, bowel sounds + Extremities: no edema, no cyanosis     The results of significant diagnostics from this hospitalization (including imaging, microbiology, ancillary and laboratory) are listed below for reference.     Microbiology: Recent Results (from the past 240 hour(s))  Urine Culture (for pregnant, neutropenic or urologic patients or patients with an indwelling urinary catheter)     Status: Abnormal   Collection Time: 11/11/22  4:22 PM   Specimen: Urine, Clean Catch  Result Value Ref Range Status   Specimen Description   Final    URINE, CLEAN CATCH Performed at [facility], [address]  [place], [place] [address]    Special Requests   Final    NONE Performed at [facility], [address] [place], [place] [address]    Culture MULTIPLE SPECIES PRESENT, SUGGEST RECOLLECTION (A)  Final   Report Status 11/13/2022 FINAL  Final     Labs: BNP (last 3 results) No results for input(s): "BNP" in the last 8760 hours. Basic Metabolic Panel: Recent Labs  Lab 11/11/22 1343 11/11/22 1622 11/12/22 0452 11/12/22 1525  NA 139  --  139 138  K 2.5*  --  2.8* 3.5  CL 104  --  110 110  CO2 16*  --  20* 20*  GLUCOSE 150*  --  126* 154*  BUN 12  --  15 16  CREATININE 1.29*  --  0.82 1.03*  CALCIUM 8.8*  --  8.4* 8.8*  MG 2.1  --   --   --   PHOS  --  1.8*  --   --    Liver Function Tests: Recent Labs  Lab 11/11/22 1343 11/12/22 0452  AST 14* 10*  ALT 8 8  ALKPHOS 59 51  BILITOT 1.7* 0.6  PROT 7.0 5.4*  ALBUMIN 3.5 2.5*   No results for input(s): "LIPASE", "AMYLASE" in the last 168 hours. No results for input(s): "AMMONIA" in the last 168 hours. CBC: Recent Labs  Lab 11/11/22 1343 11/12/22 0452  WBC 14.5* 7.0  HGB 13.5 11.9*  HCT 41.7 34.6*  MCV 87.2 82.2  PLT 349 287   Cardiac Enzymes: No results for input(s): "CKTOTAL", "CKMB", "CKMBINDEX", "TROPONINI" in the last 168 hours. BNP: Invalid input(s): "POCBNP" CBG: Recent Labs  Lab 11/11/22 1322  GLUCAP 148*   D-Dimer No results for input(s): "DDIMER" in the last 72 hours. Hgb A1c No results for input(s): "HGBA1C" in the last 72 hours. Lipid Profile No results for input(s): "CHOL", "HDL", "LDLCALC", "TRIG", "CHOLHDL", "LDLDIRECT" in the last 72 hours. Thyroid function studies Recent Labs    11/11/22 1343  TSH 1.298   Anemia work up No results for input(s): "VITAMINB12", "FOLATE", "FERRITIN", "TIBC", "IRON", "RETICCTPCT" in the last 72 hours. Urinalysis    Component Value Date/Time   COLORURINE YELLOW (A) 11/11/2022 1623   APPEARANCEUR CLOUDY (A) 11/11/2022 1623   APPEARANCEUR Cloudy  (A) 02/15/2022 0847   LABSPEC 1.010 11/11/2022 1623   LABSPEC 1.017 05/15/2014 1034   PHURINE 5.0 11/11/2022 1623   GLUCOSEU NEGATIVE 11/11/2022 1623   GLUCOSEU Negative 05/15/2014 1034   HGBUR SMALL (A) 11/11/2022 1623   BILIRUBINUR NEGATIVE 11/11/2022 1623   BILIRUBINUR Negative 02/15/2022 0847   BILIRUBINUR Negative 05/15/2014 1034   KETONESUR 80 (A) 11/11/2022 1623   PROTEINUR 30 (A) 11/11/2022 1623   NITRITE NEGATIVE 11/11/2022 1623   LEUKOCYTESUR LARGE (A) 11/11/2022 1623   LEUKOCYTESUR 2+ 05/15/2014 1034   Sepsis Labs Recent Labs  Lab 11/11/22 1343 11/12/22 0452  WBC 14.5* 7.0   Microbiology Recent Results (from the past 240 hour(s))  Urine Culture (for pregnant, neutropenic or urologic patients or patients with an indwelling urinary catheter)     Status: Abnormal   Collection Time: 11/11/22  4:22 PM   Specimen: Urine, Clean Catch  Result Value Ref Range Status   Specimen Description   Final    URINE, CLEAN CATCH Performed at [facility], [address], [place], [place] [address]    Special Requests   Final    NONE Performed at [facility], [address] [place], [place] [address]    Culture MULTIPLE SPECIES PRESENT, SUGGEST RECOLLECTION (A)  Final   Report Status 11/13/2022 FINAL  Final   Imaging DG Chest Port 1 View  Result Date: 11/11/2022 CLINICAL DATA:  Syncope, dizziness EXAM: PORTABLE CHEST 1 VIEW COMPARISON:  08/31/2011 FINDINGS: Transverse diameter of heart is increased. There are no signs of pulmonary edema or focal pulmonary consolidation. There is no pleural effusion or pneumothorax. IMPRESSION: Cardiomegaly. There are no signs of pulmonary edema or focal pulmonary consolidation. Electronically Signed   By: Ernie Avena M.D.   On: 11/11/2022 14:23      Time coordinating discharge: over 30 minutes  SIGNED:  Sunnie Nielsen DO [facility]

## 2022-11-14 ENCOUNTER — Telehealth: Payer: Self-pay

## 2022-11-14 NOTE — Transitions of Care (Post Inpatient/ED Visit) (Signed)
   11/14/2022  Name: Kaitlin [name] MRN: [id number] DOB: [birth year]  Today's TOC FU Call Status: Today's TOC FU Call Status:: Unsuccessul Call (1st Attempt) Unsuccessful Call (1st Attempt) Date: 11/14/22  Attempted to reach the patient regarding the most recent Inpatient/ED visit.  Follow Up Plan: Additional outreach attempts will be made to reach the patient to complete the Transitions of Care (Post Inpatient/ED visit) call.  Karena Addison, LPN [facility] Nurse Health Advisor Direct Dial [phone]  Signature Karena Addison, LPN [facility] Nurse Health Advisor Direct Dial [phone]

## 2022-11-14 NOTE — Transitions of Care (Post Inpatient/ED Visit) (Signed)
   11/14/2022  Name: Kaitlin [name] MRN: [id number] DOB: 01-Nov-1934  Today's TOC FU Call Status: Today's TOC FU Call Status:: Successful TOC FU Call Competed Unsuccessful Call (1st Attempt) Date: 11/14/22 [facility] FU Call Complete Date: 11/14/22  Attempted to reach the patient regarding the most recent Inpatient/ED visit.  Follow Up Plan: Additional outreach attempts will be made to reach the patient to complete the Transitions of Care (Post Inpatient/ED visit) call.   Signature Karena Addison, LPN [facility] [place] [facility] Nurse Health Advisor Direct Dial [phone]

## 2022-11-14 NOTE — Transitions of Care (Post Inpatient/ED Visit) (Signed)
   11/14/2022  Name: Kaitlin [name] MRN: [id number] DOB: Apr 19, 1935  Today's TOC FU Call Status: Today's TOC FU Call Status:: Successful TOC FU Call Competed Unsuccessful Call (1st Attempt) Date: 11/14/22 [facility] FU Call Complete Date: 11/14/22  Transition Care Management Follow-up Telephone Call Date of Discharge: 11/13/22 Discharge Facility: [facility]) Type of Discharge: Inpatient Admission Primary Inpatient Discharge Diagnosis:: syncope How have you been since you were released from the hospital?: Better Any questions or concerns?: No  Items Reviewed: Did you receive and understand the discharge instructions provided?: Yes Medications obtained,verified, and reconciled?: Yes (Medications Reviewed) Any new allergies since your discharge?: No Dietary orders reviewed?: Yes Do you have support at home?: Yes People in Home: friend(s)  Medications Reviewed Today: Medications Reviewed Today     Reviewed by Karena Addison, LPN (Licensed Practical Nurse) on 11/14/22 at 1651  Med List Status: <None>   Medication Order Taking? Sig Documenting Provider Last Dose Status Informant  aspirin 81 MG tablet [id number] No Take 81 mg by mouth daily. [provider] 11/11/2022 Active Friend  Biotin 1000 MCG tablet [id number] No Take 1,000 mcg by mouth daily. [provider] Taking Active Friend  calcium carbonate (OS-CAL - DOSED IN MG OF ELEMENTAL CALCIUM) 1250 (500 Ca) MG tablet [id number] No Take by mouth. [provider] Taking Active   cephALEXin (KEFLEX) 500 MG capsule [id number]  Take 1 capsule (500 mg total) by mouth 3 (three) times daily for 4 days. Sunnie Nielsen, DO  Active   donepezil (ARICEPT) 10 MG tablet [id number] No Take 1 tablet (10 mg total) by mouth at bedtime. Larae Grooms, NP 11/10/2022 Active   memantine (NAMENDA) 10 MG tablet [id number] No Take 1 tablet (10 mg total) by mouth 2 (two) times daily. Larae Grooms, NP  11/11/2022 Active   Misc Natural Products (OSTEO BI-FLEX ADV TRIPLE ST) TABS [id number] No Take 1 tablet by mouth in the morning and at bedtime. [provider] Taking Active Friend  pravastatin (PRAVACHOL) 40 MG tablet [id number] No Take 1 tablet (40 mg total) by mouth daily. Larae Grooms, NP 11/11/2022 Active             Home Care and Equipment/Supplies: Were Home Health Services Ordered?: NA Any new equipment or medical supplies ordered?: NA  Functional Questionnaire: Do you need assistance with bathing/showering or dressing?: No Do you need assistance with meal preparation?: No Do you need assistance with eating?: No Do you have difficulty maintaining continence: No Do you need assistance with getting out of bed/getting out of a chair/moving?: No Do you have difficulty managing or taking your medications?: No  Follow up appointments reviewed: PCP Follow-up appointment confirmed?: Yes Date of PCP follow-up appointment?: 11/20/22 Follow-up Provider: [facility] Follow-up appointment confirmed?: NA Do you need transportation to your follow-up appointment?: No Do you understand care options if your condition(s) worsen?: Yes-patient verbalized understanding    SIGNATURE Karena Addison, LPN [facility] Nurse Health Advisor Direct Dial [phone]

## 2022-11-14 NOTE — Telephone Encounter (Signed)
Noted, has visit 11/20/22.

## 2022-11-16 ENCOUNTER — Telehealth: Payer: Self-pay | Admitting: Nurse Practitioner

## 2022-11-16 NOTE — Telephone Encounter (Signed)
Home Health Verbal Orders - Caller/Agency: Arline Asp with [facility] Callback Number: [phone]  Requesting OT/PT Frequency: Beginning treatment starting tomorrow  Arline Asp states it was supposed to be before today but due to the holiday and staffing it will be tomorrow. Please assist further

## 2022-11-19 NOTE — Telephone Encounter (Signed)
Called and LVM notifying [facility] that [facility] gave the OK to start services 11/22/22

## 2022-11-19 NOTE — Telephone Encounter (Signed)
Called and LVM asking for Arline Asp to please return my call. VM was not secure, did not leave a detailed VM.

## 2022-11-19 NOTE — Telephone Encounter (Signed)
Gerri Spore (physical therapist) with [facility] has called in regards to there was a delayed start of care due to the patient and per patients request, to start Physical Therapy on 11/22/2022, this Thursday, and [facility] needs a call back with approval   [place]'s contact #: [phone] (secured voicemail )

## 2022-11-20 ENCOUNTER — Inpatient Hospital Stay: Payer: Medicare Other | Admitting: Physician Assistant

## 2022-11-22 ENCOUNTER — Inpatient Hospital Stay
Admission: EM | Admit: 2022-11-22 | Discharge: 2022-11-26 | DRG: 690 | Disposition: A | Discharge status: Skilled Nursing Facility | Payer: Medicare Other | Attending: Internal Medicine | Admitting: Internal Medicine

## 2022-11-22 ENCOUNTER — Encounter: Payer: Self-pay | Admitting: Emergency Medicine

## 2022-11-22 ENCOUNTER — Other Ambulatory Visit: Payer: Self-pay

## 2022-11-22 ENCOUNTER — Emergency Department: Payer: Medicare Other

## 2022-11-22 DIAGNOSIS — M19041 Primary osteoarthritis, right hand: Secondary | ICD-10-CM | POA: Diagnosis present

## 2022-11-22 DIAGNOSIS — R531 Weakness: Secondary | ICD-10-CM | POA: Diagnosis not present

## 2022-11-22 DIAGNOSIS — I10 Essential (primary) hypertension: Secondary | ICD-10-CM | POA: Diagnosis present

## 2022-11-22 DIAGNOSIS — Z881 Allergy status to other antibiotic agents status: Secondary | ICD-10-CM

## 2022-11-22 DIAGNOSIS — Z87891 Personal history of nicotine dependence: Secondary | ICD-10-CM

## 2022-11-22 DIAGNOSIS — E860 Dehydration: Secondary | ICD-10-CM | POA: Diagnosis present

## 2022-11-22 DIAGNOSIS — Z79899 Other long term (current) drug therapy: Secondary | ICD-10-CM

## 2022-11-22 DIAGNOSIS — N390 Urinary tract infection, site not specified: Principal | ICD-10-CM | POA: Diagnosis present

## 2022-11-22 DIAGNOSIS — B952 Enterococcus as the cause of diseases classified elsewhere: Secondary | ICD-10-CM | POA: Diagnosis present

## 2022-11-22 DIAGNOSIS — E559 Vitamin D deficiency, unspecified: Secondary | ICD-10-CM | POA: Diagnosis present

## 2022-11-22 DIAGNOSIS — Z87442 Personal history of urinary calculi: Secondary | ICD-10-CM | POA: Diagnosis present

## 2022-11-22 DIAGNOSIS — M109 Gout, unspecified: Secondary | ICD-10-CM | POA: Diagnosis present

## 2022-11-22 DIAGNOSIS — E210 Primary hyperparathyroidism: Secondary | ICD-10-CM | POA: Diagnosis present

## 2022-11-22 DIAGNOSIS — Z7982 Long term (current) use of aspirin: Secondary | ICD-10-CM

## 2022-11-22 DIAGNOSIS — E785 Hyperlipidemia, unspecified: Secondary | ICD-10-CM | POA: Diagnosis present

## 2022-11-22 DIAGNOSIS — R54 Age-related physical debility: Secondary | ICD-10-CM | POA: Diagnosis present

## 2022-11-22 DIAGNOSIS — Z90722 Acquired absence of ovaries, bilateral: Secondary | ICD-10-CM

## 2022-11-22 DIAGNOSIS — Z1621 Resistance to vancomycin: Secondary | ICD-10-CM | POA: Diagnosis present

## 2022-11-22 DIAGNOSIS — Z7984 Long term (current) use of oral hypoglycemic drugs: Secondary | ICD-10-CM

## 2022-11-22 DIAGNOSIS — G309 Alzheimer's disease, unspecified: Secondary | ICD-10-CM | POA: Diagnosis present

## 2022-11-22 DIAGNOSIS — Z90710 Acquired absence of both cervix and uterus: Secondary | ICD-10-CM

## 2022-11-22 DIAGNOSIS — Z886 Allergy status to analgesic agent status: Secondary | ICD-10-CM

## 2022-11-22 DIAGNOSIS — F0280 Dementia in other diseases classified elsewhere without behavioral disturbance: Secondary | ICD-10-CM | POA: Diagnosis present

## 2022-11-22 DIAGNOSIS — E876 Hypokalemia: Secondary | ICD-10-CM | POA: Diagnosis present

## 2022-11-22 LAB — LACTIC ACID, PLASMA
Lactic Acid, Venous: 1.2 mmol/L (ref 0.5–1.9)
Lactic Acid, Venous: 1.4 mmol/L (ref 0.5–1.9)

## 2022-11-22 LAB — SEDIMENTATION RATE: Sed Rate: 44 mm/hr — ABNORMAL HIGH (ref 0–30)

## 2022-11-22 LAB — HEPATIC FUNCTION PANEL
ALT: 11 U/L (ref 0–44)
AST: 17 U/L (ref 15–41)
Albumin: 2.7 g/dL — ABNORMAL LOW (ref 3.5–5.0)
Alkaline Phosphatase: 53 U/L (ref 38–126)
Bilirubin, Direct: 0.2 mg/dL (ref 0.0–0.2)
Indirect Bilirubin: 0.9 mg/dL (ref 0.3–0.9)
Total Bilirubin: 1.1 mg/dL (ref 0.3–1.2)
Total Protein: 6.2 g/dL — ABNORMAL LOW (ref 6.5–8.1)

## 2022-11-22 LAB — URINALYSIS, ROUTINE W REFLEX MICROSCOPIC
Bilirubin Urine: NEGATIVE
Glucose, UA: NEGATIVE mg/dL
Hgb urine dipstick: NEGATIVE
Ketones, ur: 5 mg/dL — AB
Nitrite: NEGATIVE
Protein, ur: 100 mg/dL — AB
Specific Gravity, Urine: 1.019 (ref 1.005–1.030)
pH: 6 (ref 5.0–8.0)

## 2022-11-22 LAB — CBC
HCT: 34.4 % — ABNORMAL LOW (ref 36.0–46.0)
Hemoglobin: 11.5 g/dL — ABNORMAL LOW (ref 12.0–15.0)
MCH: 28.3 pg (ref 26.0–34.0)
MCHC: 33.4 g/dL (ref 30.0–36.0)
MCV: 84.5 fL (ref 80.0–100.0)
Platelets: 313 10*3/uL (ref 150–400)
RBC: 4.07 MIL/uL (ref 3.87–5.11)
RDW: 15 % (ref 11.5–15.5)
WBC: 11.2 10*3/uL — ABNORMAL HIGH (ref 4.0–10.5)
nRBC: 0 % (ref 0.0–0.2)

## 2022-11-22 LAB — BASIC METABOLIC PANEL
Anion gap: 11 (ref 5–15)
BUN: 14 mg/dL (ref 8–23)
CO2: 22 mmol/L (ref 22–32)
Calcium: 8.6 mg/dL — ABNORMAL LOW (ref 8.9–10.3)
Chloride: 107 mmol/L (ref 98–111)
Creatinine, Ser: 0.7 mg/dL (ref 0.44–1.00)
GFR, Estimated: >60 mL/min (ref >60)
Glucose, Bld: 115 mg/dL — ABNORMAL HIGH (ref 70–99)
Potassium: 3.5 mmol/L (ref 3.5–5.1)
Sodium: 140 mmol/L (ref 135–145)

## 2022-11-22 LAB — TROPONIN I (HIGH SENSITIVITY): Troponin I (High Sensitivity): 5 ng/L (ref <18)

## 2022-11-22 LAB — MAGNESIUM: Magnesium: 2 mg/dL (ref 1.7–2.4)

## 2022-11-22 LAB — C-REACTIVE PROTEIN: CRP: 9.1 mg/dL — ABNORMAL HIGH (ref <1.0)

## 2022-11-22 LAB — CK: Total CK: 161 U/L (ref 38–234)

## 2022-11-22 MED ORDER — SODIUM CHLORIDE 0.9 % IV SOLN
1.0000 g | Freq: Once | INTRAVENOUS | Status: AC
  Administered 2022-11-22: 1 g via INTRAVENOUS
  Filled 2022-11-22 (×2): qty 10

## 2022-11-22 MED ORDER — DONEPEZIL HCL 5 MG PO TABS
10.0000 mg | ORAL_TABLET | Freq: Every day | ORAL | Status: DC
  Administered 2022-11-22 – 2022-11-25 (×4): 10 mg via ORAL
  Filled 2022-11-22 (×4): qty 2

## 2022-11-22 MED ORDER — HYDRALAZINE HCL 10 MG PO TABS: 10.0000 mg | ORAL_TABLET | Freq: Four times a day (QID) | ORAL | Status: DC | PRN

## 2022-11-22 MED ORDER — ONDANSETRON HCL 4 MG/2ML IJ SOLN: 4.0000 mg | Freq: Four times a day (QID) | INTRAMUSCULAR | Status: DC | PRN

## 2022-11-22 MED ORDER — MEMANTINE HCL 5 MG PO TABS
10.0000 mg | ORAL_TABLET | Freq: Two times a day (BID) | ORAL | Status: DC
  Administered 2022-11-22 – 2022-11-26 (×8): 10 mg via ORAL
  Filled 2022-11-22 (×8): qty 2

## 2022-11-22 MED ORDER — ASPIRIN 81 MG PO TBEC
81.0000 mg | DELAYED_RELEASE_TABLET | Freq: Every day | ORAL | Status: DC
  Administered 2022-11-23 – 2022-11-26 (×4): 81 mg via ORAL
  Filled 2022-11-22 (×4): qty 1

## 2022-11-22 MED ORDER — BIOTIN 1000 MCG PO TABS: 1000.0000 ug | ORAL_TABLET | Freq: Every day | ORAL | Status: DC

## 2022-11-22 MED ORDER — SODIUM CHLORIDE 0.9 % IV SOLN
1.0000 g | INTRAVENOUS | Status: DC
  Administered 2022-11-23: 1 g via INTRAVENOUS
  Filled 2022-11-22 (×2): qty 10

## 2022-11-22 MED ORDER — SENNOSIDES-DOCUSATE SODIUM 8.6-50 MG PO TABS: 1.0000 | ORAL_TABLET | Freq: Every evening | ORAL | Status: DC | PRN

## 2022-11-22 MED ORDER — PRAVASTATIN SODIUM 20 MG PO TABS
40.0000 mg | ORAL_TABLET | Freq: Every day | ORAL | Status: DC
  Administered 2022-11-23 – 2022-11-26 (×4): 40 mg via ORAL
  Filled 2022-11-22 (×4): qty 2

## 2022-11-22 MED ORDER — ENOXAPARIN SODIUM 40 MG/0.4ML IJ SOSY
40.0000 mg | PREFILLED_SYRINGE | INTRAMUSCULAR | Status: DC
  Administered 2022-11-22 – 2022-11-25 (×4): 40 mg via SUBCUTANEOUS
  Filled 2022-11-22 (×4): qty 0.4

## 2022-11-22 MED ORDER — ONDANSETRON HCL 4 MG PO TABS: 4.0000 mg | ORAL_TABLET | Freq: Four times a day (QID) | ORAL | Status: DC | PRN

## 2022-11-22 NOTE — Hospital Course (Addendum)
Ms. Kaitlin [name] is a 88 year old female with history of hyperlipidemia, dementia, non-insulin-dependent diabetes mellitus, hypertension, who presents to the emergency department for chief concerns of generalized weakness and fatigue. Patient is diagnosed with UTI, started on Rocephin.

## 2022-11-22 NOTE — ED Triage Notes (Signed)
Patient to ED via [facility] from home for generalized weakness that started this AM. Patient reports recent hospitalization for low potassium. VS WNL with EMS. Reports generalized body pain.

## 2022-11-22 NOTE — Assessment & Plan Note (Signed)
Pravastatin 40 mg daily

## 2022-11-22 NOTE — Assessment & Plan Note (Signed)
Memantine 10 mg p.o. twice daily, donepezil 10 mg nightly resumed

## 2022-11-22 NOTE — Assessment & Plan Note (Signed)
Present on admission, patient is likely not taking her antibiotics that were prescribed on discharge on 11/13/2022 Ceftriaxone 1 g IV daily, continue for 7-day course

## 2022-11-22 NOTE — Assessment & Plan Note (Signed)
Patient is currently not on antihypertensive medication and her blood pressure was controlled on admission Hydralazine 10 mg p.o. every 6 hours as needed for SBP greater than 170, 4 days ordered

## 2022-11-22 NOTE — ED Provider Notes (Signed)
[facility] Provider Note    Event Date/Time   First MD Initiated Contact with Patient 11/22/22 1153     (approximate)   History   Weakness   HPI  Kaitlin [name] is a 88 y.o. female patient is an 88 year old female with past medical history significant for diabetes, hypertension, hyperlipidemia, parathyroid surgery, dementia, who presents to the emergency department with generalized weakness and fatigue.  History is provided by her friend who is at bedside.  Patient had a recent hospitalization and was discharged on 11/13/2022.  Patient was hospitalized for hypokalemia and a syncopal episode while she was at church.  Patient was ultimately able to be discharged home.  Friend at bedside states that even at discharge she was having ongoing generalized weakness and had difficulty getting her from the wheelchair back into her house.  Progressively worsening generalized weakness and fatigue.  No longer tolerating p.o.  Worsening altered mental status over the past 1 week.  Uncertain if she has had any falls or trauma.  Patient has not been tolerating her antibiotics at home and was discharged on Keflex.  Complains of intermittently having joints that are swollen and painful.  Mild pain to bilateral wrists at this time and hands.     Physical Exam   Triage Vital Signs: ED Triage Vitals [11/22/22 1023]  Encounter Vitals Group     BP 114/87     Systolic BP Percentile      Diastolic BP Percentile      Pulse Rate 95     Resp 18     Temp 97.8 F (36.6 C)     Temp Source Oral     SpO2 99 %     Weight      Height      Head Circumference      Peak Flow      Pain Score 7     Pain Loc      Pain Education      Exclude from Growth Chart     Most recent vital signs: Vitals:   11/22/22 1023  BP: 114/87  Pulse: 95  Resp: 18  Temp: 97.8 F (36.6 C)  SpO2: 99%    Physical Exam Constitutional:      Appearance: She is well-developed.  HENT:     Head:      Comments: Dry mucous membranes Eyes:     Conjunctiva/sclera: Conjunctivae normal.  Cardiovascular:     Rate and Rhythm: Regular rhythm.  Pulmonary:     Effort: No respiratory distress.  Abdominal:     General: There is no distension.     Tenderness: There is no right CVA tenderness or left CVA tenderness.  Musculoskeletal:        General: Normal range of motion.     Cervical back: Normal range of motion.  Skin:    General: Skin is warm.     Capillary Refill: Capillary refill takes less than 2 seconds.  Neurological:     General: No focal deficit present.     Mental Status: She is alert. She is disoriented.     Comments: Following simple commands.  5/5 strength bilateral upper and lower extremities.     IMPRESSION / MDM / ASSESSMENT AND PLAN / ED COURSE  I reviewed the triage vital signs and the nursing notes.  On chart review patient had a recent hospitalization for hypokalemia, found to have a urinary tract infection but did not have any signs of  sepsis.  The patient was treated with IV Rocephin and then transition to Keflex.  Differential diagnosis including intracranial hemorrhage, CVA, dehydration, electrolyte abnormality, urinary tract infection  EKG  I, Corena Herter, the attending physician, personally viewed and interpreted this ECG.   Rate: Normal  Rhythm: Normal sinus  Axis: Normal  Intervals: Normal  ST&T Change: None Multiple PACs present.  No tachycardic or bradycardic dysrhythmias while on cardiac telemetry.  RADIOLOGY I independently reviewed imaging, my interpretation of imaging: CT scan of the head without signs of intracranial hemorrhage.  Read as no acute findings.  LABS (all labs ordered are listed, but only abnormal results are displayed) Labs interpreted as -    Labs Reviewed  BASIC METABOLIC PANEL - Abnormal; Notable for the following components:      Result Value   Glucose, Bld 115 (*)    Calcium 8.6 (*)    All other components within  normal limits  CBC - Abnormal; Notable for the following components:   WBC 11.2 (*)    Hemoglobin 11.5 (*)    HCT 34.4 (*)    All other components within normal limits  URINALYSIS, ROUTINE W REFLEX MICROSCOPIC - Abnormal; Notable for the following components:   Color, Urine AMBER (*)    APPearance HAZY (*)    Ketones, ur 5 (*)    Protein, ur 100 (*)    Leukocytes,Ua TRACE (*)    Bacteria, UA RARE (*)    All other components within normal limits  HEPATIC FUNCTION PANEL - Abnormal; Notable for the following components:   Total Protein 6.2 (*)    Albumin 2.7 (*)    All other components within normal limits  SEDIMENTATION RATE - Abnormal; Notable for the following components:   Sed Rate 44 (*)    All other components within normal limits  CULTURE, BLOOD (ROUTINE X 2)  CULTURE, BLOOD (ROUTINE X 2)  CK  C-REACTIVE PROTEIN  LACTIC ACID, PLASMA  LACTIC ACID, PLASMA  CBG MONITORING, ED  TROPONIN I (HIGH SENSITIVITY)     MDM    Patient with leukocytosis of 11.2.  Creatinine at baseline with no significant electrolyte abnormalities.  UA concerning for possible urinary tract infection.  On chart review patient had urine cultures that had multiple species.  Urine was not in and out catheterization to get a good sample.  Given her leukocytosis with generalized weakness and fatigue will add on blood cultures and a lactic acid.  Given IV Rocephin given no history of resistant urinary tract infection.  Patient has not been taking her home antibiotics.  Consulted hospitalist for admission for weakness and urinary tract infection.   PROCEDURES:  Critical Care performed: No  Procedures  Patient's presentation is most consistent with acute presentation with potential threat to life or bodily function.   MEDICATIONS ORDERED IN ED: Medications  cefTRIAXone (ROCEPHIN) 1 g in sodium chloride 0.9 % 100 mL IVPB (has no administration in time range)    FINAL CLINICAL IMPRESSION(S) / ED  DIAGNOSES   Final diagnoses:  Weakness  Dehydration  Urinary tract infection without hematuria, site unspecified     Rx / DC Orders   ED Discharge Orders     None        Note:  This document was prepared using Dragon voice recognition software and may include unintentional dictation errors.   Corena Herter, MD 11/22/22 1358

## 2022-11-22 NOTE — Progress Notes (Signed)
PHARMACIST - PHYSICIAN ORDER COMMUNICATION  CONCERNING: P&T Medication Policy on Herbal Medications  DESCRIPTION:  This patient's order for:  Biotin  has been noted.  This product(s) is classified as an "herbal" or natural product. Due to a lack of definitive safety studies or FDA approval, nonstandard manufacturing practices, plus the potential risk of unknown drug-drug interactions while on inpatient medications, the Pharmacy and Therapeutics Committee does not permit the use of "herbal" or natural products of this type within [facility].   ACTION TAKEN: The pharmacy department is unable to verify this order at this time. Please reevaluate patient's clinical condition at discharge and address if the herbal or natural product(s) should be resumed at that time.  Clovia Cuff, PharmD, BCPS 11/22/2022 7:17 PM

## 2022-11-22 NOTE — H&P (Signed)
History and Physical   Kaitlin [name] LOV:[id number] DOB: Sep 16, 1934 DOA: 11/22/2022  PCP: Larae Grooms, NP  Patient coming from: Home via EMS  I have personally briefly reviewed patient's old medical records in [facility] EMR.  Chief Concern: Weakness, fatigue  HPI: Ms. Kaitlin [name] is a 88 year old female with history of hyperlipidemia, dementia, non-insulin-dependent diabetes mellitus, hypertension, who presents to the emergency department for chief concerns of generalized weakness and fatigue.  History was provided by her friend at bedside who states that patient had recent hospitalization and was discharged home with p.o. antibiotic and that patient is not taking her p.o. antibiotic as appropriate.  Patient has dementia and lives at home on her own.  Vitals in the ED showed temperature of 97.8, respiration rate of 18, heart rate 95, blood pressure 114/87, SpO2 of 99% on room air.  Serum sodium is 140, potassium 3.5, chloride 107, bicarb 22, BUN of 14, serum creatinine of 0.70, nonfasting blood glucose 115, EGFR greater than 60, WBC 11.2, hemoglobin 11.5, platelets of 313.  CK is 161, high sensitive troponin is 4, sed rate was 44.  UA was positive for trace leukocytes, squamous cell epithelial was 0.5, and rare bacteria.  ED treatment: Ceftriaxone 1 g IV. -------------------------- At bedside, patient was able to tell me her name and states that her age is 5 and on recheck she states she was 69.  I corrected her and states that she is 62.  She was able to tell me that she is in the hospital and she was able to identify her friend Dondra Spry at bedside.  She was not able to tell me the current calendar year or the current month.  She does not know when the last time she had a nice meal was that she states it was yesterday that she had a meal at a restaurant however her friend states that it was about 2 weeks ago that they went out to a restaurant.  She denies nausea, vomiting.  She  was not able to tell me if she has been compliant with her antibiotics though her friend at bedside states that she is likely not taking her antibiotics as appropriate.  She has been too weak to ambulate around the house since being discharged from the hospital.  She denies being in pain and states that nothing is bothering her right now.  Social history: She lives on her own in her own home.  She has several caring neighbors throughout the neighborhood.  She denies tobacco, EtOH, recreational drug use.  ROS: Not able to accurately complete as patient appears to have moderate to advanced dementia is a poor historian.  ED Course: Discussed with emergency medicine provider, patient requiring hospitalization for chief concerns of weakness and fatigue with increased risk of falling at home.  Assessment/Plan  Principal Problem:   Weakness Active Problems:   Alzheimer disease (HCC)   Essential (primary) hypertension   Hyperlipidemia   Hyperparathyroidism, primary (HCC)   History of nephrolithiasis   UTI (urinary tract infection)   Assessment and Plan:  * Weakness Fall precaution check B12 on admission, if low, a.m. team to replete as appropriate Check magnesium on admission PT, OT, TOC consulted Fall precaution  UTI (urinary tract infection) Present on admission, patient is likely not taking her antibiotics that were prescribed on discharge on 11/13/2022 Ceftriaxone 1 g IV daily, continue for 7-day course  Hyperlipidemia Pravastatin 40 mg daily  Essential (primary) hypertension Patient is currently not on antihypertensive medication  and her blood pressure was controlled on admission Hydralazine 10 mg p.o. every 6 hours as needed for SBP greater than 170, 4 days ordered  Alzheimer disease (HCC) Memantine 10 mg p.o. twice daily, donepezil 10 mg nightly resumed  Chart reviewed.   DVT prophylaxis: Enoxaparin Code Status: Full code Diet: Heart healthy/carb modified, nectar  thick Family Communication: Updated patient's friend Dondra Spry at bedside with patient's permission Disposition Plan: Pending clinical course, pending PT, OT evaluation Consults called: PT, OT, TOC consulted Admission status: Telemetry medical, observation  Past Medical History:  Diagnosis Date   Alzheimer disease (HCC)    Cystitis    Diffuse cystic mastopathy    Gout    Hypertension    Osteopetrosis    Past Surgical History:  Procedure Laterality Date   BLADDER STONE REMOVAL  2008   BREAST EXCISIONAL BIOPSY Right    benign; many years ago   BREAST LUMPECTOMY Right 1974   positive-no radiation or chemo per pt   COLONOSCOPY  1994   LAPAROSCOPY  2013   gallstone removal   NASAL SINUS SURGERY     OOPHORECTOMY Bilateral    PARATHYROIDECTOMY N/A 08/12/2020   Procedure: PARATHYROIDECTOMY with RNFA to assist;  Surgeon: Duanne Guess, MD;  Location: [facility] ORS;  Service: General;  Laterality: N/A;   TONSILLECTOMY     TOTAL ABDOMINAL HYSTERECTOMY W/ BILATERAL SALPINGOOPHORECTOMY  1980   Social History:  reports that she quit smoking about 32 years ago. Her smoking use included cigarettes. She has never used smokeless tobacco. She reports that she does not drink alcohol and does not use drugs.  Allergies  Allergen Reactions   Augmentin [Amoxicillin-Pot Clavulanate] Nausea And Vomiting and Rash   Tetracyclines & Related Rash   Tylenol [Acetaminophen] Rash   Family History  Problem Relation Age of Onset   Kidney Stones Brother    Dementia Mother    Aneurysm Mother    Other Father        unknown medical history   Breast cancer Neg Hx    Family history: Family history reviewed and not pertinent.  Prior to Admission medications   Medication Sig Start Date End Date Taking? Authorizing Provider  aspirin 81 MG tablet Take 81 mg by mouth daily.    [provider]  Biotin 1000 MCG tablet Take 1,000 mcg by mouth daily.    [provider]  calcium carbonate (OS-CAL -  DOSED IN MG OF ELEMENTAL CALCIUM) 1250 (500 Ca) MG tablet Take by mouth.    [provider]  donepezil (ARICEPT) 10 MG tablet Take 1 tablet (10 mg total) by mouth at bedtime. 06/07/22   Larae Grooms, NP  memantine (NAMENDA) 10 MG tablet Take 1 tablet (10 mg total) by mouth 2 (two) times daily. 06/07/22   Larae Grooms, NP  Misc Natural Products (OSTEO BI-FLEX ADV TRIPLE ST) TABS Take 1 tablet by mouth in the morning and at bedtime.    [provider]  pravastatin (PRAVACHOL) 40 MG tablet Take 1 tablet (40 mg total) by mouth daily. 06/07/22   Larae Grooms, NP   Physical Exam: Vitals:   11/22/22 1023 11/22/22 1800  BP: 114/87   Pulse: 95   Resp: 18   Temp: 97.8 F (36.6 C)   TempSrc: Oral   SpO2: 99%   Weight:  68.9 kg  Height:  5\' 6"  (1.676 m)   Constitutional: appears frail, NAD , calm Eyes: PERRL, lids and conjunctivae normal ENMT: Mucous membranes are moist. Posterior pharynx clear  of any exudate or lesions. Age-appropriate dentition. Hearing appropriate Neck: normal, supple, no masses, no thyromegaly Respiratory: clear to auscultation bilaterally, no wheezing, no crackles. Normal respiratory effort. No accessory muscle use.  Cardiovascular: Regular rate and rhythm, no murmurs / rubs / gallops. No extremity edema. 2+ pedal pulses. No carotid bruits.  Abdomen: no tenderness, no masses palpated, no hepatosplenomegaly. Bowel sounds positive.  Musculoskeletal: no clubbing / cyanosis. No joint deformity upper and lower extremities. Good ROM, no contractures, no atrophy. Normal muscle tone.  Skin: no rashes, lesions, ulcers. No induration Neurologic: Sensation intact. Strength 5/5 in all 4.  Psychiatric: Normal judgment and insight. Alert and oriented x 3. Normal mood.   EKG: independently reviewed, showing sinus rhythm with rate of 93, occasional PVCs, QTc 447  Chest x-ray on Admission: I personally reviewed and I agree with radiologist reading as  below.  CT Head Wo Contrast  Result Date: 11/22/2022 CLINICAL DATA:  Altered mental status EXAM: CT HEAD WITHOUT CONTRAST TECHNIQUE: Contiguous axial images were obtained from the base of the skull through the vertex without intravenous contrast. RADIATION DOSE REDUCTION: This exam was performed according to the departmental dose-optimization program which includes automated exposure control, adjustment of the mA and/or kV according to patient size and/or use of iterative reconstruction technique. COMPARISON:  None Available. FINDINGS: Brain: No acute intracranial findings are seen. There are no signs of bleeding within the cranium. Cortical sulci are prominent. Vascular: Unremarkable. 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 Sinuses/Orbits: Unremarkable. Other: None. IMPRESSION: No acute intracranial findings are seen in noncontrast CT brain. Atrophy. 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 Electronically Signed   By: Ernie Avena M.D.   On: 11/22/2022 13:19    Labs on Admission: I have personally reviewed following labs  CBC: Recent Labs  Lab 11/22/22 1025  WBC 11.2*  HGB 11.5*  HCT 34.4*  MCV 84.5  PLT 313   Basic Metabolic Panel: Recent Labs  Lab 11/22/22 1025 11/22/22 1428  NA 140  --   K 3.5  --   CL 107  --   CO2 22  --   GLUCOSE 115*  --   BUN 14  --   CREATININE 0.70  --   CALCIUM 8.6*  --   MG  --  2.0   GFR: Estimated Creatinine Clearance: 47.3 mL/min (by C-G formula based on SCr of 0.7 mg/dL).  Liver Function Tests: Recent Labs  Lab 11/22/22 1025  AST 17  ALT 11  ALKPHOS 53  BILITOT 1.1  PROT 6.2*  ALBUMIN 2.7*   Cardiac Enzymes: Recent Labs  Lab 11/22/22 1025  CKTOTAL 161   Urine analysis:    Component Value Date/Time   COLORURINE AMBER (A) 11/22/2022 1225   APPEARANCEUR HAZY (A) 11/22/2022 1225   APPEARANCEUR Cloudy (A) 02/15/2022 0847   LABSPEC 1.019 11/22/2022 1225    LABSPEC 1.017 05/15/2014 1034   PHURINE 6.0 11/22/2022 1225   GLUCOSEU NEGATIVE 11/22/2022 1225   GLUCOSEU Negative 05/15/2014 1034   HGBUR NEGATIVE 11/22/2022 1225   BILIRUBINUR NEGATIVE 11/22/2022 1225   BILIRUBINUR Negative 02/15/2022 0847   BILIRUBINUR Negative 05/15/2014 1034   KETONESUR 5 (A) 11/22/2022 1225   PROTEINUR 100 (A) 11/22/2022 1225   NITRITE NEGATIVE 11/22/2022 1225   LEUKOCYTESUR TRACE (A) 11/22/2022 1225   LEUKOCYTESUR 2+ 05/15/2014 1034   This document was prepared using Dragon Voice Recognition software and may include unintentional dictation errors.  Dr. Sedalia Muta [facility]  If 7PM-7AM, please contact overnight-coverage provider If 7AM-7PM, please contact day attending provider [email or web address]  11/22/2022, 7:18 PM

## 2022-11-22 NOTE — Assessment & Plan Note (Signed)
Fall precaution check B12 on admission, if low, a.m. team to replete as appropriate Check magnesium on admission PT, OT, TOC consulted Fall precaution

## 2022-11-23 DIAGNOSIS — F0280 Dementia in other diseases classified elsewhere without behavioral disturbance: Secondary | ICD-10-CM

## 2022-11-23 DIAGNOSIS — Z90710 Acquired absence of both cervix and uterus: Secondary | ICD-10-CM | POA: Diagnosis not present

## 2022-11-23 DIAGNOSIS — N3000 Acute cystitis without hematuria: Secondary | ICD-10-CM

## 2022-11-23 DIAGNOSIS — R54 Age-related physical debility: Secondary | ICD-10-CM | POA: Diagnosis present

## 2022-11-23 DIAGNOSIS — Z90722 Acquired absence of ovaries, bilateral: Secondary | ICD-10-CM | POA: Diagnosis not present

## 2022-11-23 DIAGNOSIS — N390 Urinary tract infection, site not specified: Secondary | ICD-10-CM | POA: Diagnosis present

## 2022-11-23 DIAGNOSIS — M109 Gout, unspecified: Secondary | ICD-10-CM | POA: Diagnosis present

## 2022-11-23 DIAGNOSIS — R531 Weakness: Secondary | ICD-10-CM | POA: Diagnosis present

## 2022-11-23 DIAGNOSIS — Z7984 Long term (current) use of oral hypoglycemic drugs: Secondary | ICD-10-CM | POA: Diagnosis not present

## 2022-11-23 DIAGNOSIS — Z886 Allergy status to analgesic agent status: Secondary | ICD-10-CM | POA: Diagnosis not present

## 2022-11-23 DIAGNOSIS — B952 Enterococcus as the cause of diseases classified elsewhere: Secondary | ICD-10-CM | POA: Diagnosis present

## 2022-11-23 DIAGNOSIS — M19041 Primary osteoarthritis, right hand: Secondary | ICD-10-CM | POA: Diagnosis present

## 2022-11-23 DIAGNOSIS — E860 Dehydration: Secondary | ICD-10-CM | POA: Diagnosis present

## 2022-11-23 DIAGNOSIS — E785 Hyperlipidemia, unspecified: Secondary | ICD-10-CM | POA: Diagnosis present

## 2022-11-23 DIAGNOSIS — G309 Alzheimer's disease, unspecified: Secondary | ICD-10-CM | POA: Diagnosis present

## 2022-11-23 DIAGNOSIS — E559 Vitamin D deficiency, unspecified: Secondary | ICD-10-CM | POA: Diagnosis present

## 2022-11-23 DIAGNOSIS — Z87891 Personal history of nicotine dependence: Secondary | ICD-10-CM | POA: Diagnosis not present

## 2022-11-23 DIAGNOSIS — Z79899 Other long term (current) drug therapy: Secondary | ICD-10-CM | POA: Diagnosis not present

## 2022-11-23 DIAGNOSIS — Z881 Allergy status to other antibiotic agents status: Secondary | ICD-10-CM | POA: Diagnosis not present

## 2022-11-23 DIAGNOSIS — E210 Primary hyperparathyroidism: Secondary | ICD-10-CM | POA: Diagnosis present

## 2022-11-23 DIAGNOSIS — I10 Essential (primary) hypertension: Secondary | ICD-10-CM | POA: Diagnosis present

## 2022-11-23 DIAGNOSIS — E876 Hypokalemia: Secondary | ICD-10-CM | POA: Diagnosis present

## 2022-11-23 DIAGNOSIS — Z1621 Resistance to vancomycin: Secondary | ICD-10-CM | POA: Diagnosis present

## 2022-11-23 DIAGNOSIS — Z7982 Long term (current) use of aspirin: Secondary | ICD-10-CM | POA: Diagnosis not present

## 2022-11-23 LAB — BASIC METABOLIC PANEL WITH GFR
Anion gap: 7 (ref 5–15)
BUN: 13 mg/dL (ref 8–23)
CO2: 24 mmol/L (ref 22–32)
Calcium: 8 mg/dL — ABNORMAL LOW (ref 8.9–10.3)
Chloride: 108 mmol/L (ref 98–111)
Creatinine, Ser: 0.73 mg/dL (ref 0.44–1.00)
GFR, Estimated: >60 mL/min
Glucose, Bld: 101 mg/dL — ABNORMAL HIGH (ref 70–99)
Potassium: 3.4 mmol/L — ABNORMAL LOW (ref 3.5–5.1)
Sodium: 139 mmol/L (ref 135–145)

## 2022-11-23 LAB — CBC
HCT: 32.7 % — ABNORMAL LOW (ref 36.0–46.0)
Hemoglobin: 10.5 g/dL — ABNORMAL LOW (ref 12.0–15.0)
MCH: 27.6 pg (ref 26.0–34.0)
MCHC: 32.1 g/dL (ref 30.0–36.0)
MCV: 86.1 fL (ref 80.0–100.0)
Platelets: 287 10*3/uL (ref 150–400)
RBC: 3.8 MIL/uL — ABNORMAL LOW (ref 3.87–5.11)
RDW: 15.2 % (ref 11.5–15.5)
WBC: 6.4 10*3/uL (ref 4.0–10.5)
nRBC: 0 % (ref 0.0–0.2)

## 2022-11-23 LAB — VITAMIN B12: Vitamin B-12: 535 pg/mL (ref 180–914)

## 2022-11-23 MED ORDER — IBUPROFEN 400 MG PO TABS
400.0000 mg | ORAL_TABLET | Freq: Three times a day (TID) | ORAL | Status: DC | PRN
  Administered 2022-11-23: 400 mg via ORAL
  Filled 2022-11-23: qty 1

## 2022-11-23 MED ORDER — POTASSIUM CHLORIDE CRYS ER 20 MEQ PO TBCR
40.0000 meq | EXTENDED_RELEASE_TABLET | Freq: Once | ORAL | Status: AC
  Administered 2022-11-23: 40 meq via ORAL
  Filled 2022-11-23: qty 2

## 2022-11-23 NOTE — [place] FL2 (Signed)
[place] MEDICAID FL2 LEVEL OF CARE FORM     IDENTIFICATION  Patient Name: Kaitlin [name] Birthdate: 08/19/1934 Sex: female Admission Date (Current Location): 11/22/2022  [employer] and [place] Number:  Chiropodist and Address:  [facility], [address], [place], [place] [address]      Provider Number: [id number]  Attending Physician Name and Address:  Marrion Coy, MD  Relative Name and Phone Number:  [name] (friend)  [phone]    Current Level of Care: Hospital Recommended Level of Care: Skilled Nursing Facility Prior Approval Number:    Date Approved/Denied:   PASRR Number: [id number]  Discharge Plan: SNF    Current Diagnoses: Patient Active Problem List   Diagnosis Date Noted   Weakness 11/22/2022   UTI (urinary tract infection) 11/22/2022   Sinus arrhythmia seen on electrocardiogram 11/13/2022   Hypokalemia 11/11/2022   Asymptomatic bacteriuria 09/11/2022   Advanced care planning/counseling discussion 06/07/2022   Stage 3b chronic kidney disease (HCC) 06/29/2021   S/P parathyroidectomy 08/16/2020   History of nephrolithiasis 10/28/2018   Hyperlipidemia 01/02/2017   Mitral regurgitation 01/02/2017   Heart palpitations 06/23/2014   SOB (shortness of breath) on exertion 06/23/2014   Hyperparathyroidism, primary (HCC) 05/12/2014   Multiple thyroid nodules 05/12/2014   Osteoporosis 05/12/2014   Primary osteoarthritis of left knee 03/23/2014   Internal derangement of left knee 01/14/2014   Alzheimer disease (HCC) 09/21/2013   Atrophic vaginitis 07/23/2013   Benign neoplasm of colon, unspecified 07/23/2013   Essential (primary) hypertension 07/23/2013   Gout 07/23/2013   Nephrolithiasis 07/23/2013   Urinary incontinence 07/23/2013   Hypercalcemia 07/23/2013   Fibrocystic breast disease 12/18/2012    Orientation RESPIRATION BLADDER Height & Weight     Self, Place  Normal Incontinent, External catheter Weight: 151 lb  14.4 oz (68.9 kg) Height:  5\' 6"  (167.6 cm)  BEHAVIORAL SYMPTOMS/MOOD NEUROLOGICAL BOWEL NUTRITION STATUS      Continent Diet (see discharge summary)  AMBULATORY STATUS COMMUNICATION OF NEEDS Skin   Limited Assist Verbally Normal                       Personal Care Assistance Level of Assistance  Bathing, Dressing, Total care, Feeding Bathing Assistance: Limited assistance Feeding assistance: Independent Dressing Assistance: Limited assistance Total Care Assistance: Limited assistance   Functional Limitations Info  Sight, Hearing, Speech Sight Info: Adequate Hearing Info: Adequate Speech Info: Adequate    SPECIAL CARE FACTORS FREQUENCY  PT (By licensed PT), OT (By licensed OT)     PT Frequency: min 4x weekly OT Frequency: min 4x weekly            Contractures Contractures Info: Not present    Additional Factors Info  Code Status, Allergies Code Status Info: full Allergies Info: augmentin, tetracyclines, tylenol           Current Medications (11/23/2022):  This is the current hospital active medication list Current Facility-Administered Medications  Medication Dose Route Frequency Provider Last Rate Last Admin   aspirin EC tablet 81 mg  81 mg Oral Daily Cox, Amy N, DO   81 mg at 11/23/22 0811   cefTRIAXone (ROCEPHIN) 1 g in sodium chloride 0.9 % 100 mL IVPB  1 g Intravenous Q24H Cox, Amy N, DO       donepezil (ARICEPT) tablet 10 mg  10 mg Oral QHS Cox, Amy N, DO   10 mg at 11/22/22 2029   enoxaparin (LOVENOX) injection 40 mg  40 mg  Subcutaneous Q24H Cox, Amy N, DO   40 mg at 11/22/22 2029   hydrALAZINE (APRESOLINE) tablet 10 mg  10 mg Oral Q6H PRN Cox, Amy N, DO       ibuprofen (ADVIL) tablet 400 mg  400 mg Oral Q8H PRN Marrion Coy, MD       memantine [facility]) tablet 10 mg  10 mg Oral BID Cox, Amy N, DO   10 mg at 11/23/22 0811   ondansetron (ZOFRAN) tablet 4 mg  4 mg Oral Q6H PRN Cox, Amy N, DO       Or   ondansetron (ZOFRAN) injection 4 mg  4 mg  Intravenous Q6H PRN Cox, Amy N, DO       pravastatin (PRAVACHOL) tablet 40 mg  40 mg Oral Daily Cox, Amy N, DO   40 mg at 11/23/22 1610   senna-docusate (Senokot-S) tablet 1 tablet  1 tablet Oral QHS PRN Cox, Amy N, DO         Discharge Medications: Please see discharge summary for a list of discharge medications.  Relevant Imaging Results:  Relevant Lab Results:   Additional Information SSN: [id number]  Darolyn Rua, LCSW

## 2022-11-23 NOTE — Evaluation (Signed)
Occupational Therapy Evaluation Patient Details Name: Kaitlin [name] MRN: [id number] DOB: [birth year] Today's Date: 11/23/2022   History of Present Illness Pt is an 88 y/o F admitted on 11/22/22 after presenting with c/o generalized weakness & fatigue. Pt is being treated for UTI. PMH: HLD, dementia, NIDDM, HTN   Clinical Impression   Pt was seen for OT evaluation this date. Prior to hospital admission, pt was living alone with friends who help her with transportation and pt was able to start Meals on Wheels. She typically is able to meal prep, do laundry, etc for herself without difficulty.  Pt presents to acute OT demonstrating impaired ADL performance and functional mobility 2/2 R>L hand and all over body pain, decr safety awareness and STM, and hx falls (See OT problem list for additional functional deficits). Pt currently requires MOD A +2 for safety with ADL STS and step pivot 2/2 pt endorsing feeling scared and more comfortable with a 2nd person. MAX A for LB ADL tasks, MOD A for UB ADL tasks. Pt/friend educated in RUE positioning to support edema mgt. Pt would benefit from skilled OT services to address noted impairments and functional limitations (see below for any additional details) in order to maximize safety and independence while minimizing falls risk and caregiver burden.    Recommendations for follow up therapy are one component of a multi-disciplinary discharge planning process, led by the attending physician.  Recommendations may be updated based on patient status, additional functional criteria and insurance authorization.   Assistance Recommended at Discharge Frequent or constant Supervision/Assistance  Patient can return home with the following Assistance with cooking/housework;Assist for transportation;Help with stairs or ramp for entrance;Direct supervision/assist for medications management;A lot of help with walking and/or transfers;A lot of help with  bathing/dressing/bathroom    Functional Status Assessment  Patient has had a recent decline in their functional status and demonstrates the ability to make significant improvements in function in a reasonable and predictable amount of time.  Equipment Recommendations  Other (comment) (defer to next venue)    Recommendations for Other Services       Precautions / Restrictions Precautions Precautions: Fall Restrictions Weight Bearing Restrictions: No      Mobility Bed Mobility Overal bed mobility: Needs Assistance Bed Mobility: Sit to Supine       Sit to supine: Min assist        Transfers Overall transfer level: Needs assistance Equipment used: Rolling walker (2 wheels) Transfers: Sit to/from Stand Sit to Stand: Mod assist, +2 safety/equipment     Step pivot transfers: Min assist     General transfer comment: Pt tearful, anxious to attempt without 2nd person so nurse tech came to assist      Balance Overall balance assessment: Needs assistance Sitting-balance support: No upper extremity supported, Feet supported Sitting balance-Leahy Scale: Fair     Standing balance support: During functional activity, Bilateral upper extremity supported, Reliant on assistive device for balance Standing balance-Leahy Scale: Poor                             ADL either performed or assessed with clinical judgement   ADL                                         General ADL Comments: Pt currently requires setup and supv for seated grooming tasks,  MOD A for LB ADL tasks, and MOD A (+2 for safety per pt request) for transfers with RW     Vision         Perception     Praxis      Pertinent Vitals/Pain Pain Assessment Pain Assessment: 0-10 Pain Score: 10-Worst pain ever Pain Location: R>L hand and "all over" Pain Descriptors / Indicators: Guarding, Grimacing, Discomfort, Aching Pain Intervention(s): Repositioned, Premedicated before  session, Monitored during session, Limited activity within patient's tolerance     Hand Dominance Right   Extremity/Trunk Assessment Upper Extremity Assessment Upper Extremity Assessment: Generalized weakness;RUE deficits/detail RUE Deficits / Details: R hand/wrist edema & redness noted, pt with c/o pain in this hand/joint & nurse/MD aware   Lower Extremity Assessment Lower Extremity Assessment: Generalized weakness       Communication Communication Communication: No difficulties   Cognition Arousal/Alertness: Awake/alert Behavior During Therapy: WFL for tasks assessed/performed Overall Cognitive Status: No family/caregiver present to determine baseline cognitive functioning                                 General Comments: Pt oriented to self, situation, and place. Anxious with movement, Follows simple commands with time and tactile cues     General Comments  Pt to have sleeve on R knee, R foot, reports these are 2/2 old "baseball" injury.    Exercises     Shoulder Instructions      Home Living Family/patient expects to be discharged to:: Private residence Living Arrangements: Alone Available Help at Discharge: Friend(s);Available PRN/intermittently Type of Home: House Home Access: Stairs to enter Entrance Stairs-Number of Steps: 2 plus 1 step Entrance Stairs-Rails: Right Home Layout: One level     Bathroom Shower/Tub: Walk-in shower         Home Equipment: Grab bars - toilet;Grab bars - tub/shower;Rolling Walker (2 wheels);Cane - single point;Hand held shower head   Additional Comments: Most information obtained from chart.      Prior Functioning/Environment Prior Level of Function : Independent/Modified Independent             Mobility Comments: Pt reports she's ambulatory without AD, unable to provide extensive PLOF, most obtained from chart. ADLs Comments: Pt reports she was planning ot receive Meals-On-Wheels but wasn't home long  enough to start program.        OT Problem List: Decreased strength;Decreased cognition;Decreased safety awareness;Decreased activity tolerance;Impaired balance (sitting and/or standing);Decreased knowledge of use of DME or AE      OT Treatment/Interventions: Self-care/ADL training;Therapeutic exercise;Therapeutic activities;Cognitive remediation/compensation;DME and/or AE instruction;Patient/family education;Balance training    OT Goals(Current goals can be found in the care plan section) Acute Rehab OT Goals Patient Stated Goal: go home OT Goal Formulation: With patient Time For Goal Achievement: 12/07/22 Potential to Achieve Goals: Good ADL Goals Pt Will Perform Upper Body Dressing: sitting;with modified independence Pt Will Perform Lower Body Dressing: sit to/from stand;with min assist Pt Will Transfer to Toilet: ambulating;bedside commode;with min assist (LRAD) Pt Will Perform Toileting - Clothing Manipulation and hygiene: with supervision;sitting/lateral leans Additional ADL Goal #1: Pt will complete med mgt activity with 0 errors, 4/4 opportunities.  OT Frequency: Min 1X/week    Co-evaluation              AM-PAC OT "6 Clicks" Daily Activity     Outcome Measure Help from another person eating meals?: None Help from another person taking care of personal grooming?: A  Little Help from another person toileting, which includes using toliet, bedpan, or urinal?: A Lot Help from another person bathing (including washing, rinsing, drying)?: A Lot Help from another person to put on and taking off regular upper body clothing?: A Little Help from another person to put on and taking off regular lower body clothing?: A Lot 6 Click Score: 16   End of Session Equipment Utilized During Treatment: Gait belt;Rolling walker (2 wheels) Nurse Communication: Mobility status;Other (comment)  Activity Tolerance: Patient tolerated treatment well Patient left: in bed;with call bell/phone  within reach;with bed alarm set;with family/visitor present  OT Visit Diagnosis: Other abnormalities of gait and mobility (R26.89);Muscle weakness (generalized) (M62.81)                Time: 4403-4742 OT Time Calculation (min): 29 min Charges:  OT General Charges $OT Visit: 1 Visit OT Evaluation $OT Eval Moderate Complexity: 1 Mod OT Treatments $Self Care/Home Management : 8-22 mins  Arman Filter., MPH, MS, OTR/L ascom [phone] 11/23/22, 1:53 PM

## 2022-11-23 NOTE — Progress Notes (Signed)
  Progress Note   Patient: Kaitlin [name]:[id number] DOB: 1934-06-09 DOA: 11/22/2022     0 DOS: the patient was seen and examined on 11/23/2022   Brief hospital course: Ms. Amiela Renner is a 88 year old female with history of hyperlipidemia, dementia, non-insulin-dependent diabetes mellitus, hypertension, who presents to the emergency department for chief concerns of generalized weakness and fatigue. Patient is diagnosed with UTI, started on Rocephin.   Principal Problem:   Weakness Active Problems:   Alzheimer disease (HCC)   Essential (primary) hypertension   Hyperlipidemia   Hyperparathyroidism, primary (HCC)   History of nephrolithiasis   Hypokalemia   UTI (urinary tract infection)   Assessment and Plan: * Weakness UTI (urinary tract infection) Prior urine culture failed multiple species, repeated cultures this admission sent out, pending results.  Patient still has signal weakness, continue PT and OT, continue antibiotic with Rocephin pending culture results.  Hyperlipidemia Pravastatin 40 mg daily  Essential (primary) hypertension Resume home medicines.  Alzheimer disease (HCC) Memantine 10 mg p.o. twice daily, donepezil 10 mg nightly resumed       Subjective:  Patient has some baseline confusion, otherwise doing well.   Physical Exam: Vitals:   11/22/22 1800 11/22/22 1923 11/23/22 0329 11/23/22 0752  BP:  118/76 111/63 113/62  Pulse:  78 87 80  Resp:  20 16 17   Temp:  98.5 F (36.9 C) 98.5 F (36.9 C) 98.3 F (36.8 C)  TempSrc:  Oral Oral Oral  SpO2:  98% 97% 95%  Weight: 68.9 kg     Height: 5\' 6"  (1.676 m)      General exam: Appears calm and comfortable  Respiratory system: Clear to auscultation. Respiratory effort normal. Cardiovascular system: S1 & S2 heard, RRR. No JVD, murmurs, rubs, gallops or clicks. No pedal edema. Gastrointestinal system: Abdomen is nondistended, soft and nontender. No organomegaly or masses felt. Normal bowel sounds  heard. Central nervous system: Alert and oriented x2. No focal neurological deficits. Extremities: Symmetric 5 x 5 power. Skin: No rashes, lesions or ulcers Psychiatry: Judgement and insight appear normal. Mood & affect appropriate.    Data Reviewed:  Lab results reviewed.  Family Communication: None  Disposition: Status is: Observation The patient will require care spanning > 2 midnights and should be moved to inpatient because: Severity disease, IV treatment. Patient has been seen by PT/OT, recommended SNF placement.     Time spent: 35 minutes  Author: Marrion Coy, MD 11/23/2022 12:41 PM  For on call review [email or web address].

## 2022-11-23 NOTE — Evaluation (Signed)
Physical Therapy Evaluation Patient Details Name: Kaitlin [name] MRN: [id number] DOB: May 17, 1934 Today's Date: 11/23/2022  History of Present Illness  Pt is an 88 y/o F admitted on 11/22/22 after presenting with c/o generalized weakness & fatigue. Pt is being treated for UTI. PMH: HLD, dementia, NIDDM, HTN  Clinical Impression  Pt seen for PT evaluation with pt agreeable. Pt oriented to self, location, but not situation or time. Pt reports prior to admission she was living home alone, just starting Meals-On-Wheels program. Pt able to provided limited PLOF/home set up information & most taken from chart. On this date, pt is able to complete bed mobility with hospital bed features but movement is effortful for pt & pt requires encouragement to attempt. Pt completes STS with min assist, step pivot bed>recliner with RW & min assist. Pt appears very fatigued with minimal activity & endorses significant pain. Pt assisted with meal tray set up (opening containers, cutting food). Recommend ongoing PT services to maximize independence with mobility & reduce fall risk.      Assistance Recommended at Discharge Frequent or constant Supervision/Assistance  If plan is discharge home, recommend the following:  Can travel by private vehicle  A little help with walking and/or transfers;Assistance with cooking/housework;A lot of help with bathing/dressing/bathroom;Assist for transportation;Help with stairs or ramp for entrance;Direct supervision/assist for medications management;Direct supervision/assist for financial management   Yes    Equipment Recommendations None recommended by PT  Recommendations for Other Services       Functional Status Assessment Patient has had a recent decline in their functional status and demonstrates the ability to make significant improvements in function in a reasonable and predictable amount of time.     Precautions / Restrictions Precautions Precautions:  Fall Restrictions Weight Bearing Restrictions: No      Mobility  Bed Mobility Overal bed mobility: Needs Assistance Bed Mobility: Supine to Sit     Supine to sit: Supervision, HOB elevated     General bed mobility comments: extra time, encouragement to attempt    Transfers Overall transfer level: Needs assistance Equipment used: Rolling walker (2 wheels) Transfers: Sit to/from Stand Sit to Stand: Min assist   Step pivot transfers: Min assist       General transfer comment: PT provides multimodal cuing for hand placement to push to standing from EOB with RW.    Ambulation/Gait                  Stairs            Wheelchair Mobility     Tilt Bed    Modified Rankin (Stroke Patients Only)       Balance Overall balance assessment: Needs assistance Sitting-balance support: No upper extremity supported, Feet supported Sitting balance-Leahy Scale: Fair     Standing balance support: During functional activity, Bilateral upper extremity supported, Reliant on assistive device for balance Standing balance-Leahy Scale: Poor                               Pertinent Vitals/Pain Pain Assessment Pain Assessment: Faces Faces Pain Scale: Hurts whole lot Pain Location: BUE hands, knees (R hand swollen & red - nurse & MD made aware) Pain Descriptors / Indicators: Guarding, Grimacing, Discomfort, Moaning Pain Intervention(s): Monitored during session, Limited activity within patient's tolerance, Repositioned    Home Living Family/patient expects to be discharged to:: Private residence Living Arrangements: Alone Available Help at Discharge: Friend(s);Available PRN/intermittently Type of  Home: House Home Access: Stairs to enter Entrance Stairs-Rails: Right Entrance Stairs-Number of Steps: 2 plus 1 step   Home Layout: One level Home Equipment: Grab bars - toilet;Grab bars - tub/shower;Rolling Walker (2 wheels);Cane - single point;Hand held  shower head Additional Comments: Most information obtained from chart.    Prior Function Prior Level of Function : Independent/Modified Independent             Mobility Comments: Pt reports she's ambulatory without AD, unable to provide extensive PLOF, most obtained from chart. ADLs Comments: Pt reports she was planning ot receive Meals-On-Wheels but wasn't home long enough to start program.     Hand Dominance   Dominant Hand: Right    Extremity/Trunk Assessment   Upper Extremity Assessment Upper Extremity Assessment: Generalized weakness;RUE deficits/detail RUE Deficits / Details: R hand/wrist edema & redness noted, pt with c/o pain in this hand/joint & nurse/MD notified    Lower Extremity Assessment Lower Extremity Assessment: Generalized weakness (3/5 knee extension BLE, pt c/o BLE knee pain)       Communication   Communication: No difficulties  Cognition Arousal/Alertness: Awake/alert Behavior During Therapy: WFL for tasks assessed/performed Overall Cognitive Status: No family/caregiver present to determine baseline cognitive functioning                                 General Comments: Pt with delayed processing, oriented to self, location but not time, situation. Follows 1 step commands with extra time. Requires encouragement for participation/to attempt tasks.        General Comments General comments (skin integrity, edema, etc.): Pt to have sleeve on R knee, R foot, reports these are 2/2 old "baseball" injury.    Exercises     Assessment/Plan    PT Assessment Patient needs continued PT services  PT Problem List Decreased strength;Cardiopulmonary status limiting activity;Pain;Decreased cognition;Decreased range of motion;Decreased knowledge of use of DME;Decreased activity tolerance;Decreased balance;Decreased mobility;Decreased safety awareness       PT Treatment Interventions DME instruction;Gait training;Stair training;Functional mobility  training;Therapeutic activities;Therapeutic exercise;Balance training;Patient/family education;Neuromuscular re-education    PT Goals (Current goals can be found in the Care Plan section)  Acute Rehab PT Goals Patient Stated Goal: decreased pain PT Goal Formulation: With patient Time For Goal Achievement: 12/07/22 Potential to Achieve Goals: Good    Frequency Min 1X/week     Co-evaluation               AM-PAC PT "6 Clicks" Mobility  Outcome Measure Help needed turning from your back to your side while in a flat bed without using bedrails?: A Little Help needed moving from lying on your back to sitting on the side of a flat bed without using bedrails?: A Little Help needed moving to and from a bed to a chair (including a wheelchair)?: A Little Help needed standing up from a chair using your arms (e.g., wheelchair or bedside chair)?: A Little Help needed to walk in hospital room?: A Lot Help needed climbing 3-5 steps with a railing? : A Lot 6 Click Score: 16    End of Session   Activity Tolerance: Patient limited by fatigue Patient left: in chair;with call bell/phone within reach Nurse Communication: Mobility status PT Visit Diagnosis: Muscle weakness (generalized) (M62.81);Pain;Unsteadiness on feet (R26.81);Other abnormalities of gait and mobility (R26.89);Difficulty in walking, not elsewhere classified (R26.2) Pain - Right/Left: Right Pain - part of body: Hand    Time: 0454-0981 PT Time  Calculation (min) (ACUTE ONLY): 14 min   Charges:   PT Evaluation $PT Eval Low Complexity: 1 Low   PT General Charges $$ ACUTE PT VISIT: 1 Visit         Aleda Grana, PT, DPT 11/23/22, 10:00 AM   Sandi Mariscal 11/23/2022, 9:58 AM

## 2022-11-23 NOTE — TOC Initial Note (Signed)
Transition of Care [facility]) - Initial/Assessment Note    Patient Details  Name: Kaitlin [name] MRN: [id number] Date of Birth: 05-04-1935  [facility] CM/SW Contact:    Chapman Fitch, RN Phone Number: 11/23/2022, 4:21 PM  Clinical Narrative:                  Admitted for: UTI Admitted from: home alone PCP: [facility] Current home health/prior home health/DME: RW, cane   Therapy recommending SNF.  Patient in agreement to bed search Patient request that I call her friend Olegario Messier to update.  Olegario Messier states she also feels that patient needs SNF.  Olegario Messier to follow up with patient's son and confirm everyone is in agreement  FL2 sent for signature Bed search initiated         Patient Goals and CMS Choice            Expected Discharge Plan and Services                                              Prior Living Arrangements/Services                       Activities of Daily Living Home Assistive Devices/Equipment: None ADL Screening (condition at time of admission) Patient's cognitive ability adequate to safely complete daily activities?: Yes Is the patient deaf or have difficulty hearing?: No Does the patient have difficulty seeing, even when wearing glasses/contacts?: No Does the patient have difficulty concentrating, remembering, or making decisions?: Yes Patient able to express need for assistance with ADLs?: Yes Does the patient have difficulty dressing or bathing?: No Independently performs ADLs?: Yes (appropriate for developmental age) Does the patient have difficulty walking or climbing stairs?: Yes Weakness of Legs: Both Weakness of Arms/Hands: None  Permission Sought/Granted                  Emotional Assessment              Admission diagnosis:  Dehydration [E86.0] Weakness [R53.1] Urinary tract infection without hematuria, site unspecified [N39.0] UTI (urinary tract infection) [N39.0] Patient Active Problem  List   Diagnosis Date Noted   Weakness 11/22/2022   UTI (urinary tract infection) 11/22/2022   Sinus arrhythmia seen on electrocardiogram 11/13/2022   Hypokalemia 11/11/2022   Asymptomatic bacteriuria 09/11/2022   Advanced care planning/counseling discussion 06/07/2022   Stage 3b chronic kidney disease (HCC) 06/29/2021   S/P parathyroidectomy 08/16/2020   History of nephrolithiasis 10/28/2018   Hyperlipidemia 01/02/2017   Mitral regurgitation 01/02/2017   Heart palpitations 06/23/2014   SOB (shortness of breath) on exertion 06/23/2014   Hyperparathyroidism, primary (HCC) 05/12/2014   Multiple thyroid nodules 05/12/2014   Osteoporosis 05/12/2014   Primary osteoarthritis of left knee 03/23/2014   Internal derangement of left knee 01/14/2014   Alzheimer disease (HCC) 09/21/2013   Atrophic vaginitis 07/23/2013   Benign neoplasm of colon, unspecified 07/23/2013   Essential (primary) hypertension 07/23/2013   Gout 07/23/2013   Nephrolithiasis 07/23/2013   Urinary incontinence 07/23/2013   Hypercalcemia 07/23/2013   Fibrocystic breast disease 12/18/2012   PCP:  Larae Grooms, NP Pharmacy:   [facility] [place], [place] - [place] - [address] [place] [address] Phone: [phone] Fax: [phone]  [facility] - [place],  [place] - [address] [place] [address] Phone: [phone] Fax: [phone]  [facility] - [place], [place] - [address] [place] [address] Phone: [phone] Fax: [phone]  [facility] [phone] - Closed - [place], [place] - [address] [place] [address] Phone: [phone] Fax: [phone]  [facility] - [place], [place] - [address] [place] [address] Phone: [phone] Fax: [phone]     Social Determinants of Health (SDOH) Social History: SDOH Screenings   Food Insecurity:  No Food Insecurity (11/22/2022)  Housing: Low Risk  (11/22/2022)  Transportation Needs: No Transportation Needs (11/22/2022)  Utilities: Not At Risk (11/22/2022)  Alcohol Screen: Low Risk  (07/31/2022)  Depression (PHQ2-9): Low Risk  (09/11/2022)  Financial Resource Strain: Low Risk  (07/31/2022)  Physical Activity: Insufficiently Active (07/31/2022)  Social Connections: Moderately Integrated (07/31/2022)  Stress: No Stress Concern Present (07/31/2022)  Tobacco Use: Medium Risk (11/22/2022)   SDOH Interventions:     Readmission Risk Interventions     No data to display

## 2022-11-24 DIAGNOSIS — G309 Alzheimer's disease, unspecified: Secondary | ICD-10-CM | POA: Diagnosis not present

## 2022-11-24 DIAGNOSIS — R531 Weakness: Secondary | ICD-10-CM | POA: Diagnosis not present

## 2022-11-24 DIAGNOSIS — M109 Gout, unspecified: Secondary | ICD-10-CM | POA: Diagnosis not present

## 2022-11-24 DIAGNOSIS — N3000 Acute cystitis without hematuria: Secondary | ICD-10-CM | POA: Diagnosis not present

## 2022-11-24 LAB — URINE CULTURE

## 2022-11-24 MED ORDER — PREDNISONE 20 MG PO TABS
20.0000 mg | ORAL_TABLET | Freq: Every day | ORAL | Status: AC
  Administered 2022-11-24 – 2022-11-26 (×3): 20 mg via ORAL
  Filled 2022-11-24 (×3): qty 1

## 2022-11-24 MED ORDER — FOSFOMYCIN TROMETHAMINE 3 G PO PACK
3.0000 g | PACK | Freq: Once | ORAL | Status: AC
  Administered 2022-11-24: 3 g via ORAL
  Filled 2022-11-24 (×2): qty 3

## 2022-11-24 NOTE — Progress Notes (Signed)
Mobility Specialist - Progress Note    11/24/22 1100  Mobility  Activity Ambulated with assistance in room  Level of Assistance Minimal assist, patient does 75% or more  Assistive Device Front wheel walker  Distance Ambulated (ft) 3 ft  Range of Motion/Exercises Active  Activity Response Tolerated well  Mobility Referral Yes  $Mobility charge 1 Mobility  Mobility Specialist Start Time (ACUTE ONLY) 1000  Mobility Specialist Stop Time (ACUTE ONLY) 1030  Mobility Specialist Time Calculation (min) (ACUTE ONLY) 30 min   Pt resting in bed on RA upon entry. Pt STS x3 and ambulates to recliner MinA with RW. Pt endorses pain in knee and right hand (RN notified). Pt returned to recliner and left with needs in reach. Pt friend at bedside.   Johnathan Hausen Mobility Specialist 11/24/22, 11:14 AM

## 2022-11-24 NOTE — Progress Notes (Signed)
  Progress Note   Patient: Kaitlin [name] MVH:[id number] DOB: 15-Feb-1935 DOA: 11/22/2022     1 DOS: the patient was seen and examined on 11/24/2022   Brief hospital course: Ms. Kaitlin [name] is a 88 year old female with history of hyperlipidemia, dementia, non-insulin-dependent diabetes mellitus, hypertension, who presents to the emergency department for chief concerns of generalized weakness and fatigue. Patient is diagnosed with UTI, started on Rocephin.   Principal Problem:   Weakness Active Problems:   Alzheimer disease (HCC)   Essential (primary) hypertension   Gouty arthritis   Hyperlipidemia   Hyperparathyroidism, primary (HCC)   History of nephrolithiasis   Hypokalemia   UTI (urinary tract infection)   Assessment and Plan: * Weakness UTI (urinary tract infection) secondary to Enterococcus faecalis. Current urine culture growing Enterococcus faecalis.  Patient did not have any systemic infection such as sepsis or pyelonephritis, treated with a dose of fosfomycin. Patient currently is waiting for nursing placement.  Gouty arthritis of the right hand. Will treat with 3 days of steroids.  Hyperlipidemia Pravastatin 40 mg daily   Essential (primary) hypertension Resume home medicines.   Alzheimer disease (HCC) Memantine 10 mg p.o. twice daily, donepezil 10 mg nightly resumed      Subjective:  Patient doing better today, still weakness.  No dysuria.  No fever or chills.  Physical Exam: Vitals:   11/23/22 1549 11/23/22 1934 11/24/22 0413 11/24/22 0822  BP: 109/77 102/63 109/71 124/76  Pulse: 90 86 69 60  Resp: 15 17 20 18   Temp: 98.2 F (36.8 C) 98.6 F (37 C) 98 F (36.7 C) 98.1 F (36.7 C)  TempSrc: Oral  Oral   SpO2: 97% 95% 97% 97%  Weight:      Height:       General exam: Appears calm and comfortable  Respiratory system: Clear to auscultation. Respiratory effort normal. Cardiovascular system: S1 & S2 heard, RRR. No JVD, murmurs, rubs, gallops or  clicks. No pedal edema. Gastrointestinal system: Abdomen is nondistended, soft and nontender. No organomegaly or masses felt. Normal bowel sounds heard. Central nervous system: Alert and oriented. No focal neurological deficits. Extremities: Right hand swelling with joint tenderness. Skin: No rashes, lesions or ulcers Psychiatry: Judgement and insight appear normal. Mood & affect appropriate.    Data Reviewed:  Lab results reviewed.  Family Communication: son updated  Disposition: Status is: Inpatient Remains inpatient appropriate because: Severity of disease.     Time spent: 35 minutes  Author: Marrion Coy, MD 11/24/2022 12:36 PM  For on call review [email or web address].

## 2022-11-24 NOTE — Progress Notes (Signed)
Mobility Specialist - Progress Note    11/24/22 1200  Mobility  Activity Ambulated with assistance in room;Transferred from chair to bed  Level of Assistance Minimal assist, patient does 75% or more  Assistive Device Front wheel walker  Distance Ambulated (ft) 3 ft  Range of Motion/Exercises Active  Activity Response Tolerated well  Mobility Referral Yes  $Mobility charge 1 Mobility  Mobility Specialist Start Time (ACUTE ONLY) 1217  Mobility Specialist Stop Time (ACUTE ONLY) 1240  Mobility Specialist Time Calculation (min) (ACUTE ONLY) 23 min    [employer] Mobility Specialist 11/24/22, 1:07 PM

## 2022-11-24 NOTE — Plan of Care (Signed)

## 2022-11-24 NOTE — Plan of Care (Signed)
  Problem: Education: Goal: Knowledge of General Education information will improve Description: Including pain rating scale, medication(s)/side effects and non-pharmacologic comfort measures 11/24/2022 0749 by Anselm Jungling, RN Outcome: Progressing 11/24/2022 0747 by Anselm Jungling, RN Outcome: Progressing   Problem: Health Behavior/Discharge Planning: Goal: Ability to manage health-related needs will improve 11/24/2022 0749 by Anselm Jungling, RN Outcome: Progressing 11/24/2022 0747 by Anselm Jungling, RN Outcome: Progressing   Problem: Clinical Measurements: Goal: Ability to maintain clinical measurements within normal limits will improve 11/24/2022 0749 by Anselm Jungling, RN Outcome: Progressing 11/24/2022 0747 by Anselm Jungling, RN Outcome: Progressing Goal: Will remain free from infection 11/24/2022 0749 by Anselm Jungling, RN Outcome: Progressing 11/24/2022 0747 by Anselm Jungling, RN Outcome: Progressing Goal: Diagnostic test results will improve 11/24/2022 0749 by Anselm Jungling, RN Outcome: Progressing 11/24/2022 0747 by Anselm Jungling, RN Outcome: Progressing Goal: Respiratory complications will improve 11/24/2022 0749 by Anselm Jungling, RN Outcome: Progressing 11/24/2022 0747 by Anselm Jungling, RN Outcome: Progressing Goal: Cardiovascular complication will be avoided 11/24/2022 0749 by Anselm Jungling, RN Outcome: Progressing 11/24/2022 0747 by Anselm Jungling, RN Outcome: Progressing   Problem: Activity: Goal: Risk for activity intolerance will decrease 11/24/2022 0749 by Anselm Jungling, RN Outcome: Progressing 11/24/2022 0747 by Anselm Jungling, RN Outcome: Progressing   Problem: Nutrition: Goal: Adequate nutrition will be maintained 11/24/2022 0749 by Anselm Jungling, RN Outcome: Progressing 11/24/2022 0747 by Anselm Jungling, RN Outcome: Progressing   Problem: Coping: Goal: Level of anxiety will decrease 11/24/2022  0749 by Anselm Jungling, RN Outcome: Progressing 11/24/2022 0747 by Anselm Jungling, RN Outcome: Progressing   Problem: Elimination: Goal: Will not experience complications related to bowel motility 11/24/2022 0749 by Anselm Jungling, RN Outcome: Progressing 11/24/2022 0747 by Anselm Jungling, RN Outcome: Progressing Goal: Will not experience complications related to urinary retention 11/24/2022 0749 by Anselm Jungling, RN Outcome: Progressing 11/24/2022 0747 by Anselm Jungling, RN Outcome: Progressing   Problem: Pain Managment: Goal: General experience of comfort will improve 11/24/2022 0749 by Anselm Jungling, RN Outcome: Progressing 11/24/2022 0747 by Anselm Jungling, RN Outcome: Progressing   Problem: Safety: Goal: Ability to remain free from injury will improve 11/24/2022 0749 by Anselm Jungling, RN Outcome: Progressing 11/24/2022 0747 by Anselm Jungling, RN Outcome: Progressing   Problem: Skin Integrity: Goal: Risk for impaired skin integrity will decrease 11/24/2022 0749 by Anselm Jungling, RN Outcome: Progressing 11/24/2022 0747 by Anselm Jungling, RN Outcome: Progressing

## 2022-11-24 NOTE — Plan of Care (Signed)
  Problem: Clinical Measurements: Goal: Ability to maintain clinical measurements within normal limits will improve Outcome: Progressing Goal: Will remain free from infection Outcome: Progressing   Problem: Activity: Goal: Risk for activity intolerance will decrease Outcome: Progressing   

## 2022-11-24 NOTE — TOC Progression Note (Signed)
Transition of Care [facility]) - Progression Note    Patient Details  Name: ARAYLA SAZAMA MRN: [id number] Date of Birth: 25-Aug-1934  [facility]) CM/SW Contact  Kemper Durie, RN Phone Number: 11/24/2022, 11:41 AM  Clinical Narrative:      Spoke with friend [name]  in regards to bed offers and discharge planning.  She state  patient's son is flying in tonight and will be in town for the next week.  She will defer choosing SNF to him.  Will discuss bed offers tomorrow with son.       Expected Discharge Plan and Services                                               Social Determinants of Health (SDOH) Interventions SDOH Screenings   Food Insecurity: No Food Insecurity (11/22/2022)  Housing: Low Risk  (11/22/2022)  Transportation Needs: No Transportation Needs (11/22/2022)  Utilities: Not At Risk (11/22/2022)  Alcohol Screen: Low Risk  (07/31/2022)  Depression (PHQ2-9): Low Risk  (09/11/2022)  Financial Resource Strain: Low Risk  (07/31/2022)  Physical Activity: Insufficiently Active (07/31/2022)  Social Connections: Moderately Integrated (07/31/2022)  Stress: No Stress Concern Present (07/31/2022)  Tobacco Use: Medium Risk (11/22/2022)    Readmission Risk Interventions     No data to display

## 2022-11-25 DIAGNOSIS — M109 Gout, unspecified: Secondary | ICD-10-CM | POA: Diagnosis not present

## 2022-11-25 DIAGNOSIS — N3000 Acute cystitis without hematuria: Secondary | ICD-10-CM | POA: Diagnosis not present

## 2022-11-25 DIAGNOSIS — R531 Weakness: Secondary | ICD-10-CM | POA: Diagnosis not present

## 2022-11-25 LAB — URINE CULTURE

## 2022-11-25 LAB — MAGNESIUM: Magnesium: 2.1 mg/dL (ref 1.7–2.4)

## 2022-11-25 LAB — BASIC METABOLIC PANEL
Anion gap: 6 (ref 5–15)
BUN: 17 mg/dL (ref 8–23)
CO2: 25 mmol/L (ref 22–32)
Calcium: 8.4 mg/dL — ABNORMAL LOW (ref 8.9–10.3)
Chloride: 106 mmol/L (ref 98–111)
Creatinine, Ser: 0.76 mg/dL (ref 0.44–1.00)
GFR, Estimated: >60 mL/min (ref >60)
Glucose, Bld: 104 mg/dL — ABNORMAL HIGH (ref 70–99)
Potassium: 4.2 mmol/L (ref 3.5–5.1)
Sodium: 137 mmol/L (ref 135–145)

## 2022-11-25 LAB — PHOSPHORUS: Phosphorus: 2.3 mg/dL — ABNORMAL LOW (ref 2.5–4.6)

## 2022-11-25 MED ORDER — POTASSIUM & SODIUM PHOSPHATES 280-160-250 MG PO PACK
1.0000 | PACK | Freq: Three times a day (TID) | ORAL | Status: AC
  Administered 2022-11-25 – 2022-11-26 (×4): 1 via ORAL
  Filled 2022-11-25 (×4): qty 1

## 2022-11-25 MED ORDER — OYSTER SHELL CALCIUM/D3 500-5 MG-MCG PO TABS
1.0000 | ORAL_TABLET | Freq: Two times a day (BID) | ORAL | Status: DC
  Administered 2022-11-25 – 2022-11-26 (×3): 1 via ORAL
  Filled 2022-11-25 (×3): qty 1

## 2022-11-25 NOTE — Progress Notes (Signed)
Mobility Specialist - Progress Note    11/25/22 1334  Mobility  Activity Ambulated with assistance in room;Transferred from bed to chair  Level of Assistance Minimal assist, patient does 75% or more  Assistive Device Four wheel walker  Distance Ambulated (ft) 8 ft  Range of Motion/Exercises Active  Activity Response Tolerated well  Mobility Referral Yes  $Mobility charge 1 Mobility  Mobility Specialist Start Time (ACUTE ONLY) 1319  Mobility Specialist Stop Time (ACUTE ONLY) 1334  Mobility Specialist Time Calculation (min) (ACUTE ONLY) 15 min   Pt resting in recliner on RA upon entry. Pt STS and ambulates to sink and back to bed MinA with RW. Pt left in bed with needs in reach bed alarm activated.   Johnathan Hausen Mobility Specialist 11/25/22, 1:37 PM

## 2022-11-25 NOTE — TOC Progression Note (Signed)
Transition of Care [facility]) - Progression Note    Patient Details  Name: ADITHRI CASHELL MRN: [id number] Date of Birth: 03/25/1935  [facility]) CM/SW Contact  Kemper Durie, RN Phone Number: 11/25/2022, 2:24 PM  Clinical Narrative:     Talked to son, he is aware of current bed offers.  State he is not familiar with the area and would like to see ratings.  Advised to go to medicare.gov for SNF ratings.  He would also like follow up with the 2 facilities that are still pending Environmental consultant and [facility]).  He will contact TOC team tomorrow with choice.        Expected Discharge Plan and Services                                               Social Determinants of Health (SDOH) Interventions SDOH Screenings   Food Insecurity: No Food Insecurity (11/22/2022)  Housing: Low Risk  (11/22/2022)  Transportation Needs: No Transportation Needs (11/22/2022)  Utilities: Not At Risk (11/22/2022)  Alcohol Screen: Low Risk  (07/31/2022)  Depression (PHQ2-9): Low Risk  (09/11/2022)  Financial Resource Strain: Low Risk  (07/31/2022)  Physical Activity: Insufficiently Active (07/31/2022)  Social Connections: Moderately Integrated (07/31/2022)  Stress: No Stress Concern Present (07/31/2022)  Tobacco Use: Medium Risk (11/22/2022)    Readmission Risk Interventions     No data to display

## 2022-11-25 NOTE — Progress Notes (Signed)
  Progress Note   Patient: Kaitlin [name] ZOX:[id number] DOB: Jan 03, 1935 DOA: 11/22/2022     2 DOS: the patient was seen and examined on 11/25/2022   Brief hospital course: Ms. Shontae Cornish is a 88 year old female with history of hyperlipidemia, dementia, non-insulin-dependent diabetes mellitus, hypertension, who presents to the emergency department for chief concerns of generalized weakness and fatigue. Patient is diagnosed with UTI, started on Rocephin.  Urine culture came back with VRE, patient had received a dose of fosfomycin on 7/13. Patient also had gout arthritis in the right hand, treated with 3 days of oral steroids.   Principal Problem:   Weakness Active Problems:   Alzheimer disease (HCC)   Essential (primary) hypertension   Gouty arthritis   Hyperlipidemia   Hyperparathyroidism, primary (HCC)   History of nephrolithiasis   Hypokalemia   UTI (urinary tract infection)   Hypophosphatemia   Hypocalcemia   Assessment and Plan: * Weakness UTI (urinary tract infection) secondary to Enterococcus faecalis. Current urine culture growing vancomycin-resistant Enterococcus faecalis.  Patient did not have any systemic infection such as sepsis or pyelonephritis, treated with a dose of fosfomycin. Patient currently is waiting for nursing placement.   Gouty arthritis of the right hand. Condition improving, continue complete steroids x 3 days.  Hypophosphatemia. Hypokalemia. Hypocalcemia. Potassium normalized, mild hypophosphatemia, repleted orally. Hypocalcemia most likely due to vitamin D deficiency, check vitamin D level, start calcium with D.   Hyperlipidemia Pravastatin 40 mg daily   Essential (primary) hypertension Resumed home medicines.   Alzheimer disease (HCC) Memantine 10 mg p.o. twice daily, donepezil 10 mg nightly resumed      Subjective:  Patient doing better, less confused.  Right hand swelling much better.  Physical Exam: Vitals:   11/24/22 1538  11/24/22 1939 11/25/22 0322 11/25/22 0757  BP: 118/79 109/75 117/75 119/79  Pulse: 81 (!) 105 72 71  Resp: 18 16 16 16   Temp: 98 F (36.7 C) 98.3 F (36.8 C) 98.3 F (36.8 C) 98.5 F (36.9 C)  TempSrc:  Oral Oral Oral  SpO2: 96% 98% 95% 92%  Weight:      Height:       General exam: Appears calm and comfortable  Respiratory system: Clear to auscultation. Respiratory effort normal. Cardiovascular system: S1 & S2 heard, RRR. No JVD, murmurs, rubs, gallops or clicks. No pedal edema. Gastrointestinal system: Abdomen is nondistended, soft and nontender. No organomegaly or masses felt. Normal bowel sounds heard. Central nervous system: Alert and oriented x3. No focal neurological deficits. Extremities: Right hand joint swelling better Skin: No rashes, lesions or ulcers Psychiatry: Judgement and insight appear normal. Mood & affect appropriate.    Data Reviewed:  Lab results reviewed.  Family Communication: Son updated at bedside.  Disposition: Status is: Inpatient Remains inpatient appropriate because: Unsafe discharge, pending nursing home placement.     Time spent: 35 minutes  Author: Marrion Coy, MD 11/25/2022 12:35 PM  For on call review [email or web address].

## 2022-11-25 NOTE — Evaluation (Signed)
Clinical/Bedside Swallow Evaluation Patient Details  Name: Kaitlin [name] MRN: [id number] Date of Birth: 10-20-1934  Today's Date: 11/25/2022 Time: SLP Start Time (ACUTE ONLY): 1415 SLP Stop Time (ACUTE ONLY): 1428 SLP Time Calculation (min) (ACUTE ONLY): 13 min  Past Medical History:  Past Medical History:  Diagnosis Date   Alzheimer disease (HCC)    Cystitis    Diffuse cystic mastopathy    Gout    Hypertension    Osteopetrosis    Past Surgical History:  Past Surgical History:  Procedure Laterality Date   BLADDER STONE REMOVAL  2008   BREAST EXCISIONAL BIOPSY Right    benign; many years ago   BREAST LUMPECTOMY Right 1974   positive-no radiation or chemo per pt   COLONOSCOPY  1994   LAPAROSCOPY  2013   gallstone removal   NASAL SINUS SURGERY     OOPHORECTOMY Bilateral    PARATHYROIDECTOMY N/A 08/12/2020   Procedure: PARATHYROIDECTOMY with RNFA to assist;  Surgeon: Duanne Guess, MD;  Location: [facility] ORS;  Service: General;  Laterality: N/A;   TONSILLECTOMY     TOTAL ABDOMINAL HYSTERECTOMY W/ BILATERAL SALPINGOOPHORECTOMY  1980   HPI:  Ms. Kaitlin [name] is a 88 year old female with history of hyperlipidemia, dementia, non-insulin-dependent diabetes mellitus, hypertension, who presents to the emergency department for chief concerns of generalized weakness and fatigue. Patient is diagnosed with UTI, started on Rocephin.  Urine culture came back with VRE, patient had received a dose of fosfomycin on 7/13. Patient also had gout arthritis in the right hand, treated with 3 days of oral steroids.    Assessment / Plan / Recommendation  Clinical Impression  ST consulted as pt was placed on nectar thick liquids at admission although not entirely sure as to reason. Pt with no reported history of dysphagia or respiratory compromise. During previous admission, pt's chest x-ray was negative for signs of pulmonary edema or focal pulmonary consolidation (11/11/2022). Pt currently consuming  thin liquids with ST consulted for formal evaluation. ST received pt during latter portion of consuming dysphagia 3 lunch with thin liquids via straw. Pt also states that she "doesn't have any problems with coughing or eating." Skilled observation was provided with no overt s/s of aspiration or dysphagia observed. She consumed all of her lunch, except for the roll and cookies. Pt's vitals remained stable and her voice remained clear throughout. At this time, current diet appears appropriate. ST services are not indicated at this time. SLP Visit Diagnosis: Dysphagia, unspecified (R13.10)    Aspiration Risk  No limitations    Diet Recommendation Dysphagia 3 (Mech soft);Thin liquid    Liquid Administration via: Cup;Straw Medication Administration: Whole meds with liquid Supervision: Patient able to self feed Compensations: Minimize environmental distractions;Slow rate;Small sips/bites Postural Changes: Seated upright at 90 degrees;Remain upright for at least 30 minutes after po intake    Other  Recommendations Oral Care Recommendations: Oral care BID    Recommendations for follow up therapy are one component of a multi-disciplinary discharge planning process, led by the attending physician.  Recommendations may be updated based on patient status, additional functional criteria and insurance authorization.  Follow up Recommendations No SLP follow up      Assistance Recommended at Discharge  N/A  Functional Status Assessment Patient has not had a recent decline in their functional status  Frequency and Duration   N/A         Prognosis   N/A     Swallow Study   General Date of Onset: 11/25/22  HPI: Ms. Kaitlin [name] is a 88 year old female with history of hyperlipidemia, dementia, non-insulin-dependent diabetes mellitus, hypertension, who presents to the emergency department for chief concerns of generalized weakness and fatigue. Patient is diagnosed with UTI, started on Rocephin.  Urine  culture came back with VRE, patient had received a dose of fosfomycin on 7/13. Patient also had gout arthritis in the right hand, treated with 3 days of oral steroids. Type of Study: Bedside Swallow Evaluation Previous Swallow Assessment: none in chart Diet Prior to this Study: Dysphagia 3 (mechanical soft);Thin liquids (Level 0) Temperature Spikes Noted: No Respiratory Status: Room air History of Recent Intubation: No Behavior/Cognition: Alert;Cooperative;Pleasant mood Oral Cavity Assessment: Within Functional Limits Oral Care Completed by SLP: No Oral Cavity - Dentition: Adequate natural dentition Vision: Functional for self-feeding Self-Feeding Abilities: Able to feed self Patient Positioning: Upright in bed Baseline Vocal Quality: Normal Volitional Cough: Strong Volitional Swallow: Able to elicit    Oral/Motor/Sensory Function Overall Oral Motor/Sensory Function: Within functional limits   Ice Chips Ice chips: Not tested   Thin Liquid Thin Liquid: Within functional limits Presentation: Self Fed;Straw    Nectar Thick Nectar Thick Liquid: Not tested   Honey Thick Honey Thick Liquid: Not tested   Puree Puree: Within functional limits Presentation: Self Fed;Spoon   Solid     Solid: Within functional limits Presentation: Self Fed Other Comments: dysphagia 3 lunch     Ellena Kamen B. Dreama Saa, M.S., CCC-SLP, Tree surgeon Certified Brain Injury Specialist [facility]  [facility] Office [phone] Ascom [phone] Fax [phone]

## 2022-11-26 DIAGNOSIS — R531 Weakness: Secondary | ICD-10-CM | POA: Diagnosis not present

## 2022-11-26 DIAGNOSIS — N3000 Acute cystitis without hematuria: Secondary | ICD-10-CM | POA: Diagnosis not present

## 2022-11-26 DIAGNOSIS — F0280 Dementia in other diseases classified elsewhere without behavioral disturbance: Secondary | ICD-10-CM | POA: Diagnosis not present

## 2022-11-26 DIAGNOSIS — G309 Alzheimer's disease, unspecified: Secondary | ICD-10-CM | POA: Diagnosis not present

## 2022-11-26 LAB — CULTURE, BLOOD (ROUTINE X 2): Culture: NO GROWTH

## 2022-11-26 LAB — VITAMIN D 25 HYDROXY (VIT D DEFICIENCY, FRACTURES): Vit D, 25-Hydroxy: 73.19 ng/mL (ref 30–100)

## 2022-11-26 MED ORDER — CALCIUM CARBONATE ANTACID 500 MG PO CHEW: 1.0000 | CHEWABLE_TABLET | Freq: Every day | ORAL | Status: AC

## 2022-11-26 MED ORDER — OYSTER SHELL CALCIUM/D3 500-5 MG-MCG PO TABS: 1.0000 | ORAL_TABLET | Freq: Two times a day (BID) | ORAL | Status: DC

## 2022-11-26 NOTE — Progress Notes (Signed)
The patient has been discharged. IV has been removed. Report called to [employer], from [facility]. Discharge instructions have been reported to [place], [place]. The patient's son has been informed that the patient has been discharged via EMS transport.

## 2022-11-26 NOTE — TOC Transition Note (Signed)
Transition of Care [facility]) - CM/SW Discharge Note   Patient Details  Name: Kaitlin [name] MRN: [id number] Date of Birth: 30-Jun-1934  [facility] of [facility]) CM/SW Contact:  Chapman Fitch, RN Phone Number: 11/26/2022, 11:45 AM   Clinical Narrative:     Son Kathlene November accepts bed at [facility] Patient in agreement  Patient will DC to: [facility] Anticipated DC date: 11/26/22  Family notified: son  Psychologist, educational by: Wendie Simmer  Per MD patient ready for DC to . RN, patient, patient's family, and facility notified of DC. Discharge Summary sent to facility. RN given number for report. DC packet on chart. Ambulance transport requested for patient.  TOC signing off.         Patient Goals and CMS Choice      Discharge Placement                         Discharge Plan and Services Additional resources added to the After Visit Summary for                                       Social Determinants of Health (SDOH) Interventions SDOH Screenings   Food Insecurity: No Food Insecurity (11/22/2022)  Housing: Low Risk  (11/22/2022)  Transportation Needs: No Transportation Needs (11/22/2022)  Utilities: Not At Risk (11/22/2022)  Alcohol Screen: Low Risk  (07/31/2022)  Depression (PHQ2-9): Low Risk  (09/11/2022)  Financial Resource Strain: Low Risk  (07/31/2022)  Physical Activity: Insufficiently Active (07/31/2022)  Social Connections: Moderately Integrated (07/31/2022)  Stress: No Stress Concern Present (07/31/2022)  Tobacco Use: Medium Risk (11/22/2022)     Readmission Risk Interventions     No data to display

## 2022-11-26 NOTE — Discharge Summary (Addendum)
Physician Discharge Summary   Patient: Kaitlin [name] MRN: [id number] DOB: Aug 21, 1934  Admit date:     11/22/2022  Discharge date: 11/26/22  Discharge Physician: Marrion Coy   PCP: Larae Grooms, NP   Recommendations at discharge:   Follow-up with PCP in 1 week.  Discharge Diagnoses: Principal Problem:   Weakness Active Problems:   Alzheimer disease (HCC)   Essential (primary) hypertension   Gouty arthritis   Hyperlipidemia   Hyperparathyroidism, primary (HCC)   History of nephrolithiasis   Hypokalemia   UTI (urinary tract infection)   Hypophosphatemia   Hypocalcemia prediabetes Resolved Problems:   * No resolved hospital problems. [facility] Course: Ms. Kaitlin [name] is a 88 year old female with history of hyperlipidemia, dementia, non-insulin-dependent diabetes mellitus, hypertension, who presents to the emergency department for chief concerns of generalized weakness and fatigue. Patient is diagnosed with UTI, started on Rocephin.  Urine culture came back with VRE, patient had received a dose of fosfomycin on 7/13. Patient also had gout arthritis in the right hand, treated with 3 days of oral steroids.  Assessment and Plan: * Weakness UTI (urinary tract infection) secondary to Enterococcus faecalis. Current urine culture growing vancomycin-resistant Enterococcus faecalis.  Patient did not have any systemic infection such as sepsis or pyelonephritis, treated with a dose of fosfomycin.    Gouty arthritis of the right hand. Condition improved, continue completed steroids x 3 days.   Hypophosphatemia. Hypokalemia. Hypocalcemia. Potassium normalized, repleted phosphate for mild hypophosphatemia. Patient only has a mild hypocalcemia, vitamin D level appears to be normal.  I will give oral potassium for 7 days.   Hyperlipidemia Pravastatin 40 mg daily   Essential (primary) hypertension Resumed home medicines.   Alzheimer disease (HCC) Memantine 10 mg p.o.  twice daily, donepezil 10 mg nightly resumed       Consultants: None Procedures performed: None  Disposition: Skilled nursing facility Diet recommendation:  Discharge Diet Orders (From admission, onward)     Start     Ordered   11/26/22 0000  Diet - low sodium heart healthy        11/26/22 1018           Cardiac diet DISCHARGE MEDICATION: Allergies as of 11/26/2022       Reactions   Augmentin [amoxicillin-pot Clavulanate] Nausea And Vomiting, Rash   Tetracyclines & Related Rash   Tylenol [acetaminophen] Rash        Medication List     STOP taking these medications    amLODipine 5 MG tablet Commonly known as: NORVASC       TAKE these medications    aspirin 81 MG tablet Take 81 mg by mouth daily.   Biotin 1000 MCG tablet Take 1,000 mcg by mouth daily.   calcium carbonate 500 MG chewable tablet Commonly known as: Tums Chew 1 tablet (200 mg of elemental calcium total) by mouth daily for 7 days.   donepezil 10 MG tablet Commonly known as: ARICEPT Take 1 tablet (10 mg total) by mouth at bedtime.   memantine 10 MG tablet Commonly known as: NAMENDA Take 1 tablet (10 mg total) by mouth 2 (two) times daily.   Osteo Bi-Flex Adv Triple St Tabs Take 1 tablet by mouth in the morning and at bedtime.   pravastatin 40 MG tablet Commonly known as: PRAVACHOL Take 1 tablet (40 mg total) by mouth daily.        Follow-up Information     Larae Grooms, NP Follow up in 1 week(s).  Specialty: Nurse Practitioner Contact information: [address] [place] [address] [phone]                Discharge Exam: Ceasar Mons Weights   11/22/22 1800  Weight: 68.9 kg   General exam: Appears calm and comfortable  Respiratory system: Clear to auscultation. Respiratory effort normal. Cardiovascular system: S1 & S2 heard, RRR. No JVD, murmurs, rubs, gallops or clicks. No pedal edema. Gastrointestinal system: Abdomen is nondistended, soft and nontender.  No organomegaly or masses felt. Normal bowel sounds heard. Central nervous system: Alert and oriented. No focal neurological deficits. Extremities: Symmetric 5 x 5 power. Skin: No rashes, lesions or ulcers Psychiatry: Judgement and insight appear normal. Mood & affect appropriate.    Condition at discharge: good  The results of significant diagnostics from this hospitalization (including imaging, microbiology, ancillary and laboratory) are listed below for reference.   Imaging Studies: CT Head Wo Contrast  Result Date: 11/22/2022 CLINICAL DATA:  Altered mental status EXAM: CT HEAD WITHOUT CONTRAST TECHNIQUE: Contiguous axial images were obtained from the base of the skull through the vertex without intravenous contrast. RADIATION DOSE REDUCTION: This exam was performed according to the departmental dose-optimization program which includes automated exposure control, adjustment of the mA and/or kV according to patient size and/or use of iterative reconstruction technique. COMPARISON:  None Available. FINDINGS: Brain: No acute intracranial findings are seen. There are no signs of bleeding within the cranium. Cortical sulci are prominent. Vascular: Unremarkable. 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 Sinuses/Orbits: Unremarkable. Other: None. IMPRESSION: No acute intracranial findings are seen in noncontrast CT brain. Atrophy. 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 Electronically Signed   By: Ernie Avena M.D.   On: 11/22/2022 13:19   DG Chest Port 1 View  Result Date: 11/11/2022 CLINICAL DATA:  Syncope, dizziness EXAM: PORTABLE CHEST 1 VIEW COMPARISON:  08/31/2011 FINDINGS: Transverse diameter of heart is increased. There are no signs of pulmonary edema or focal pulmonary consolidation. There is no pleural effusion or pneumothorax. IMPRESSION: Cardiomegaly. There are no signs of pulmonary edema or focal pulmonary consolidation.  Electronically Signed   By: Ernie Avena M.D.   On: 11/11/2022 14:23    Microbiology: Results for orders placed or performed during the hospital encounter of 11/22/22  Urine Culture (for pregnant, neutropenic or urologic patients or patients with an indwelling urinary catheter)     Status: Abnormal   Collection Time: 11/22/22 12:25 PM   Specimen: In/Out Cath Urine  Result Value Ref Range Status   Specimen Description   Final    IN/OUT CATH URINE Performed at [facility], [address], [place], [place] [address]    Special Requests   Final    NONE Performed at [facility], [address] [place], [place] [address]    Culture (A)  Final    500 COLONIES/mL ENTEROCOCCUS FAECIUM VANCOMYCIN RESISTANT ENTEROCOCCUS    Report Status 11/25/2022 FINAL  Final   Organism ID, Bacteria ENTEROCOCCUS FAECIUM (A)  Final      Susceptibility   Enterococcus faecium - MIC*    AMPICILLIN >=32 RESISTANT Resistant     NITROFURANTOIN 32 SENSITIVE Sensitive     VANCOMYCIN >=32 RESISTANT Resistant     * 500 COLONIES/mL ENTEROCOCCUS FAECIUM  Blood culture (routine x 2)     Status: None (Preliminary result)   Collection Time: 11/22/22  2:20 PM   Specimen: BLOOD  Result Value  Ref Range Status   Specimen Description BLOOD BLOOD LEFT HAND  Final   Special Requests   Final    BOTTLES DRAWN AEROBIC AND ANAEROBIC Blood Culture results may not be optimal due to an excessive volume of blood received in culture bottles   Culture   Final    NO GROWTH 4 DAYS Performed at [facility], [address] [place], [place] [address]    Report Status PENDING  Incomplete  Blood culture (routine x 2)     Status: None (Preliminary result)   Collection Time: 11/22/22  2:34 PM   Specimen: BLOOD  Result Value Ref Range Status   Specimen Description BLOOD BLOOD RIGHT HAND  Final   Special Requests   Final    BOTTLES DRAWN AEROBIC AND ANAEROBIC Blood Culture results may not be optimal  due to an excessive volume of blood received in culture bottles   Culture   Final    NO GROWTH 4 DAYS Performed at [facility], [address] [place], [place] [address]    Report Status PENDING  Incomplete    Labs: CBC: Recent Labs  Lab 11/22/22 1025 11/23/22 0421  WBC 11.2* 6.4  HGB 11.5* 10.5*  HCT 34.4* 32.7*  MCV 84.5 86.1  PLT 313 287   Basic Metabolic Panel: Recent Labs  Lab 11/22/22 1025 11/22/22 1428 11/23/22 0421 11/25/22 0403  NA 140  --  139 137  K 3.5  --  3.4* 4.2  CL 107  --  108 106  CO2 22  --  24 25  GLUCOSE 115*  --  101* 104*  BUN 14  --  13 17  CREATININE 0.70  --  0.73 0.76  CALCIUM 8.6*  --  8.0* 8.4*  MG  --  2.0  --  2.1  PHOS  --   --   --  2.3*   Liver Function Tests: Recent Labs  Lab 11/22/22 1025  AST 17  ALT 11  ALKPHOS 53  BILITOT 1.1  PROT 6.2*  ALBUMIN 2.7*   CBG: No results for input(s): "GLUCAP" in the last 168 hours.  Discharge time spent: greater than 30 minutes.  Signed: Marrion Coy, MD [facility] 11/26/2022

## 2022-11-26 NOTE — Care Management Important Message (Signed)
Important Message  Patient Details  Name: Kaitlin [name] MRN: [id number] Date of Birth: [birth year]   Medicare Important Message Given:  Yes  Reviewed Medicare IM with patient, obtained initial consent for Medicare IM.  Copy left with patient in room for reference, original to be scanned into chart.   Johnell Comings 11/26/2022, 11:11 AM

## 2022-11-26 NOTE — Plan of Care (Signed)
  Problem: Education: Goal: Knowledge of General Education information will improve Description: Including pain rating scale, medication(s)/side effects and non-pharmacologic comfort measures Outcome: Not Applicable   Problem: Health Behavior/Discharge Planning: Goal: Ability to manage health-related needs will improve Outcome: Not Applicable   Problem: Clinical Measurements: Goal: Ability to maintain clinical measurements within normal limits will improve Outcome: Not Applicable Goal: Will remain free from infection Outcome: Not Applicable Goal: Diagnostic test results will improve Outcome: Not Applicable Goal: Respiratory complications will improve Outcome: Not Applicable Goal: Cardiovascular complication will be avoided Outcome: Not Applicable   Problem: Activity: Goal: Risk for activity intolerance will decrease Outcome: Not Applicable   Problem: Nutrition: Goal: Adequate nutrition will be maintained Outcome: Not Applicable   Problem: Coping: Goal: Level of anxiety will decrease Outcome: Not Applicable   Problem: Elimination: Goal: Will not experience complications related to bowel motility Outcome: Not Applicable Goal: Will not experience complications related to urinary retention Outcome: Not Applicable   Problem: Pain Managment: Goal: General experience of comfort will improve Outcome: Not Applicable   Problem: Safety: Goal: Ability to remain free from injury will improve Outcome: Not Applicable   Problem: Skin Integrity: Goal: Risk for impaired skin integrity will decrease Outcome: Not Applicable   Problem: Acute Rehab PT Goals(only PT should resolve) Goal: Pt Will Go Supine/Side To Sit Outcome: Not Applicable Goal: Pt Will Go Sit To Supine/Side Outcome: Not Applicable Goal: Pt Will Transfer Bed To Chair/Chair To Bed Outcome: Not Applicable Goal: Pt Will Ambulate Outcome: Not Applicable Goal: Pt Will Go Up/Down Stairs Outcome: Not Applicable

## 2022-11-27 LAB — CULTURE, BLOOD (ROUTINE X 2): Culture: NO GROWTH

## 2022-12-10 ENCOUNTER — Ambulatory Visit: Payer: Medicare Other | Admitting: Physician Assistant

## 2022-12-11 ENCOUNTER — Ambulatory Visit: Payer: Medicare Other | Admitting: Physician Assistant

## 2022-12-21 ENCOUNTER — Telehealth: Payer: Self-pay | Admitting: Nurse Practitioner

## 2022-12-21 NOTE — Telephone Encounter (Signed)
Copied from CRM [id number]. Topic: General - Other >> Dec 20, 2022  4:18 PM Turkey B wrote: Reason for CRM: Lyla Son from [facility] caling to [facility] sue Dr's Wilson Singer is going to follow pt for PT and OT orders after release today from [facility]

## 2022-12-24 ENCOUNTER — Telehealth: Payer: Self-pay | Admitting: Nurse Practitioner

## 2022-12-24 NOTE — Telephone Encounter (Signed)
Home Health Verbal Orders - Caller/Agency: Thayer Ohm with [facility] Callback Number: [phone]  Requesting PT and Speech Therapy Evaluation Frequency: PT 1 x a wk for 9 wks and a Speech Therapy evaluation  Please assist further

## 2022-12-24 NOTE — Telephone Encounter (Signed)
Left message for Thayer Ohm from [facility] to provide verbal OK orders per Jacquelin Hawking, PA. Jama Flavors to give our office a call back if he has any questions or concerns regarding the voice message left.

## 2022-12-24 NOTE — Telephone Encounter (Signed)
Verbal orders provided. ?

## 2022-12-24 NOTE — Telephone Encounter (Signed)
Okay for verbal orders for the recommended home health services

## 2022-12-25 ENCOUNTER — Emergency Department
Admission: EM | Admit: 2022-12-25 | Discharge: 2022-12-25 | Disposition: A | Discharge status: Home / Self Care | Payer: Medicare Other | Attending: Emergency Medicine | Admitting: Emergency Medicine

## 2022-12-25 ENCOUNTER — Encounter: Payer: Self-pay | Admitting: Emergency Medicine

## 2022-12-25 ENCOUNTER — Other Ambulatory Visit: Payer: Self-pay

## 2022-12-25 ENCOUNTER — Emergency Department: Payer: Medicare Other

## 2022-12-25 DIAGNOSIS — M778 Other enthesopathies, not elsewhere classified: Secondary | ICD-10-CM

## 2022-12-25 DIAGNOSIS — M25511 Pain in right shoulder: Secondary | ICD-10-CM | POA: Diagnosis present

## 2022-12-25 DIAGNOSIS — M779 Enthesopathy, unspecified: Secondary | ICD-10-CM | POA: Diagnosis not present

## 2022-12-25 MED ORDER — OXYCODONE HCL 5 MG PO TABS
5.0000 mg | ORAL_TABLET | Freq: Once | ORAL | Status: AC
  Administered 2022-12-25: 5 mg via ORAL
  Filled 2022-12-25: qty 1

## 2022-12-25 MED ORDER — FENTANYL CITRATE PF 50 MCG/ML IJ SOSY
50.0000 ug | PREFILLED_SYRINGE | Freq: Once | INTRAMUSCULAR | Status: AC
  Administered 2022-12-25: 50 ug via INTRAMUSCULAR
  Filled 2022-12-25: qty 1

## 2022-12-25 MED ORDER — PREDNISONE 10 MG PO TABS: 30.0000 mg | ORAL_TABLET | Freq: Every day | ORAL | 0 refills | Status: DC

## 2022-12-25 MED ORDER — OXYCODONE HCL 5 MG PO TABS: 5.0000 mg | ORAL_TABLET | Freq: Three times a day (TID) | ORAL | 0 refills | Status: DC | PRN

## 2022-12-25 NOTE — ED Triage Notes (Signed)
Patient to ED via POV for right shoulder pain. States it started hurting 3 days ago. Unable to lift arm due to pain.

## 2022-12-25 NOTE — Discharge Instructions (Signed)
Wear the sling when up and moving around.  When you are lying down and sleeping you do not need to wear the sling.  Apply ice to the right shoulder.  Use medications as prescribed.  Follow-up with orthopedics as they may want to do a cortisone injection.

## 2022-12-25 NOTE — ED Provider Notes (Signed)
[facility] Provider Note    Event Date/Time   First MD Initiated Contact with Patient 12/25/22 0848     (approximate)   History   Shoulder Pain   HPI  Kaitlin [name] is a 88 y.o. female presents emergency department complaining of right shoulder pain and some right-sided neck pain.  Patient states hurts to move.  No weakness.  History of gout in her wrist and hands.  Was recently discharged from [facility] on August 8 due to weakness.  Son has just arrived from out of town and states she has been complaining of the neck and shoulder pain for couple of days.  Denies numbness or tingling      Physical Exam   Triage Vital Signs: ED Triage Vitals  Encounter Vitals Group     BP 12/25/22 0840 115/82     Systolic BP Percentile --      Diastolic BP Percentile --      Pulse Rate 12/25/22 0840 (!) 105     Resp 12/25/22 0840 18     Temp 12/25/22 0840 97.9 F (36.6 C)     Temp Source 12/25/22 0840 Oral     SpO2 12/25/22 0840 95 %     Weight 12/25/22 0839 151 lb 14.4 oz (68.9 kg)     Height 12/25/22 0839 5\' 6"  (1.676 m)     Head Circumference --      [facility] Flow --      Pain Score 12/25/22 0839 10     Pain Loc --      Pain Education --      Exclude from Growth Chart --     Most recent vital signs: Vitals:   12/25/22 0840  BP: 115/82  Pulse: (!) 105  Resp: 18  Temp: 97.9 F (36.6 C)  SpO2: 95%     General: Awake, no distress.   CV:  Good peripheral perfusion. regular rate and  rhythm Resp:  Normal effort.  Abd:  No distention.   Other:  C-spine nontender, tender in the right trapezius, tender at the right shoulder joint, neurovascular is intact, grips equal bilaterally, decreased range of motion secondary discomfort   ED Results / Procedures / Treatments   Labs (all labs ordered are listed, but only abnormal results are displayed) Labs Reviewed - No data to display   EKG     RADIOLOGY Right  shoulder    PROCEDURES:   Procedures   MEDICATIONS ORDERED IN ED: Medications  oxyCODONE (Oxy IR/ROXICODONE) immediate release tablet 5 mg (has no administration in time range)  fentaNYL (SUBLIMAZE) injection 50 mcg (50 mcg Intramuscular Given 12/25/22 0919)     IMPRESSION / MDM / ASSESSMENT AND PLAN / ED COURSE  I reviewed the triage vital signs and the nursing notes.                              Differential diagnosis includes, but is not limited to, fracture, gout, frozen shoulder, bursitis  Patient's presentation is most consistent with acute illness / injury with system symptoms.   Patient was given fentanyl 50 mcg IM X-ray of the right shoulder   X-ray of the right shoulder independently reviewed interpreted by me as being negative for any acute abnormality.  Radiologist does comment about a calcification tendinitis  Did explain findings to the patient and her son.  She be placed in a sling.  She was given  ice packs.  Oxycodone prior to discharge.  Patient cannot tolerate Tylenol and does have chronic kidney disease who cannot do NSAIDs or Tylenol.  Will do a 3-day burst of prednisone.  They are to follow-up with orthopedics.  In agreement treatment plan.  Discharged stable condition.   FINAL CLINICAL IMPRESSION(S) / ED DIAGNOSES   Final diagnoses:  Tendonitis of shoulder, right     Rx / DC Orders   ED Discharge Orders          Ordered    predniSONE (DELTASONE) 10 MG tablet  Daily with breakfast        12/25/22 1134    oxyCODONE (ROXICODONE) 5 MG immediate release tablet  Every 8 hours PRN        12/25/22 1134             Note:  This document was prepared using Dragon voice recognition software and may include unintentional dictation errors.    Faythe Ghee, PA-C 12/25/22 1137    Jene Every, MD 12/25/22 [phone]

## 2022-12-26 ENCOUNTER — Telehealth: Payer: Self-pay

## 2022-12-26 NOTE — Telephone Encounter (Signed)
-----   Message from [facility] [place] [facility] sent at 12/26/2022 12:03 PM EDT ----- Patient needs TOC call completed please.

## 2022-12-26 NOTE — Transitions of Care (Post Inpatient/ED Visit) (Unsigned)
   12/26/2022  Name: Kaitlin [name] MRN: [id number] DOB: 12/09/1934  Today's TOC FU Call Status: Today's TOC FU Call Status:: Unsuccessful Call (1st Attempt) Unsuccessful Call (1st Attempt) Date: 12/26/22  Attempted to reach the patient regarding the most recent Inpatient/ED visit.  Follow Up Plan: Additional outreach attempts will be made to reach the patient to complete the Transitions of Care (Post Inpatient/ED visit) call.   Signature Malen Gauze, [place]

## 2022-12-27 ENCOUNTER — Ambulatory Visit: Payer: Medicare Other | Admitting: Physician Assistant

## 2022-12-27 ENCOUNTER — Encounter: Payer: Self-pay | Admitting: Physician Assistant

## 2022-12-27 VITALS — BP 97/64 | HR 103 | Ht 66.0 in | Wt 155.4 lb

## 2022-12-27 DIAGNOSIS — Z7189 Other specified counseling: Secondary | ICD-10-CM | POA: Diagnosis not present

## 2022-12-27 DIAGNOSIS — Z789 Other specified health status: Secondary | ICD-10-CM | POA: Diagnosis not present

## 2022-12-27 DIAGNOSIS — Z0289 Encounter for other administrative examinations: Secondary | ICD-10-CM

## 2022-12-27 DIAGNOSIS — Z66 Do not resuscitate: Secondary | ICD-10-CM | POA: Diagnosis not present

## 2022-12-27 NOTE — Transitions of Care (Post Inpatient/ED Visit) (Signed)
   12/27/2022  Name: Kaitlin [name] MRN: [id number] DOB: 02-23-1935  Today's TOC FU Call Status: Today's TOC FU Call Status:: Unsuccessful Call (1st Attempt) Unsuccessful Call (1st Attempt) Date: 12/26/22  Attempted to reach the patient regarding the most recent Inpatient/ED visit.  Follow Up Plan: No further outreach attempts will be made at this time. We have been unable to contact the patient.  Patient was seen here in the office today with provider 12/27/22  Signature  [place], [place]

## 2022-12-27 NOTE — Progress Notes (Deleted)
          Acute Office Visit   Patient: Kaitlin [name]   DOB: 1934/11/30   88 y.o. Female  MRN: [id number] Visit Date: 12/27/2022  Today's healthcare provider: Oswaldo Conroy , PA-C  Introduced myself to the patient as a Secondary school teacher and provided education on APPs in clinical practice.    Chief Complaint  Patient presents with  . Form Completion    Patient son says they are here to discuss a DNR form to have completed and signed.    Subjective    HPI HPI     Form Completion    Additional comments: Patient son says they are here to discuss a DNR form to have completed and signed.       Last edited by Malen Gauze, CMA on 12/27/2022  2:09 PM.      Patient is here with her son to assist with forms     Medications: Outpatient Medications Prior to Visit  Medication Sig  . aspirin 81 MG tablet Take 81 mg by mouth daily.  . Biotin 1000 MCG tablet Take 1,000 mcg by mouth daily.  Marland Kitchen donepezil (ARICEPT) 10 MG tablet Take 1 tablet (10 mg total) by mouth at bedtime.  . memantine (NAMENDA) 10 MG tablet Take 1 tablet (10 mg total) by mouth 2 (two) times daily.  . Misc Natural Products (OSTEO BI-FLEX ADV TRIPLE ST) TABS Take 1 tablet by mouth in the morning and at bedtime.  Marland Kitchen oxyCODONE (ROXICODONE) 5 MG immediate release tablet Take 1 tablet (5 mg total) by mouth every 8 (eight) hours as needed.  . pravastatin (PRAVACHOL) 40 MG tablet Take 1 tablet (40 mg total) by mouth daily.  . predniSONE (DELTASONE) 10 MG tablet Take 3 tablets (30 mg total) by mouth daily with breakfast.   No facility-administered medications prior to visit.    Review of Systems  {Insert previous labs (optional):23779} {See past labs  Heme  Chem  Endocrine  Serology  Results Review (optional):1}   Objective    BP 97/64   Pulse (!) 103   Ht 5\' 6"  (1.676 m)   Wt 155 lb 6.4 oz (70.5 kg)   SpO2 96%   BMI 25.08 kg/m  {Insert last BP/Wt (optional):23777}{See vitals history (optional):1}   Physical Exam     No results found for any visits on 12/27/22.  Assessment & Plan      No follow-ups on file.

## 2022-12-27 NOTE — Progress Notes (Signed)
Patient is here with her son to complete DNR and MOST forms Reviewed forms with both patient and son and completed per her wishes  Reviewed with them both that these can be updated and changed as desired.  Please see scanned documents for further information

## 2022-12-28 ENCOUNTER — Telehealth: Payer: Self-pay | Admitting: Nurse Practitioner

## 2022-12-28 ENCOUNTER — Telehealth: Payer: Self-pay

## 2022-12-28 NOTE — Telephone Encounter (Signed)
Home Health Verbal Orders - Caller/Agency: Misty Stanley OT with [facility]   Callback Number: [phone] (okay to leave a voicemail)  Requesting OT  Frequency:  1 week 1 2 week 7

## 2022-12-28 NOTE — Telephone Encounter (Signed)
Spoke with Misty Stanley to provide her with verbal OK orders per [employer], PA. Misty Stanley verbalized understanding and has no further questions at this time.

## 2022-12-28 NOTE — Transitions of Care (Post Inpatient/ED Visit) (Signed)
   12/28/2022  Name: Kaitlin [name] MRN: [id number] DOB: 02-20-1935  Today's TOC FU Call Status:    Attempted to reach the patient regarding the most recent Inpatient/ED visit.  Follow Up Plan: No further outreach attempts will be made at this time. We have been unable to contact the patient.  Patient was seen in office by provider Jacquelin Hawking, PA Signature Malen Gauze, [place]

## 2022-12-28 NOTE — Telephone Encounter (Signed)
Okay for verbal orders. 

## 2022-12-28 NOTE — Telephone Encounter (Signed)
-----   Message from [facility] [place] [facility] sent at 12/26/2022 12:03 PM EDT ----- Patient needs TOC call completed please.

## 2022-12-31 ENCOUNTER — Telehealth: Payer: Self-pay | Admitting: Nurse Practitioner

## 2022-12-31 NOTE — Telephone Encounter (Signed)
Spoke with [name] from [facility] and provided verbal OK orders for the patient per [employer], PA. Cathy verbalized understanding and has no further questions.

## 2022-12-31 NOTE — Telephone Encounter (Signed)
Copied from CRM [id number]. Topic: Quick Communication - Home Health Verbal Orders >> Dec 28, 2022  4:09 PM Clide Dales wrote: Caller/Agency: [facility] Callback Number: [phone] Requesting OT/PT/Skilled Nursing/Social Work/Speech Therapy: Speech Therapy Frequency: 1w1, 2w1, 1w1

## 2022-12-31 NOTE — Telephone Encounter (Signed)
Okay for verbal orders for recommended therapies

## 2023-02-18 ENCOUNTER — Other Ambulatory Visit: Payer: Self-pay | Admitting: Urology

## 2023-02-18 DIAGNOSIS — R319 Hematuria, unspecified: Secondary | ICD-10-CM

## 2023-02-18 NOTE — Progress Notes (Unsigned)
02/19/23 10:16 AM   Kaitlin [name] [birth year] [id number]  Referring provider:  Larae Grooms, NP [address] [place],  [place] [address]  Urological history  1. High risk hematuria - Former smoker - CTU (11/2019) Small bilateral nonobstructive renal calculi. There is prominence of the left renal pelvis and the ureters are both mildly patulous to the ureterovesicular junction without overt hydronephrosis. No evidence of obstructing lesion or filling defect on delayed phase imaging - Cysto (11/2019) NED   2. rUTI's - contributing factors of age, Alzheimer's, vaginal atrophy, incontinence and constipation - documented urine cultures over the last year   November 22, 2022 - Enterococcus Faecium and VRE  November 11, 2022 -multiple species present     3. Nephrolithiasis - CTU (11/2019) small bilateral nonobstructive stones   4. Vaginal atrophy - not bothersome    5. Urgency - managing conservatively   HPI: Kaitlin [name] is a 88 y.o.female who returns today for 1 year follow up her friend, Kaitlin [name].  Previous records reviewed.   She is having 1-7 daytime voids, no episodes of nocturia with strong urge to urinate.  She does have urge incontinence.  She is leaking 1-2 times a week.  She wears depends daily.  She does not limit fluid intake.  And she does engage in toilet mapping.  Patient denies any modifying or aggravating factors.  Patient denies any recent UTI's, gross hematuria, dysuria or suprapubic/flank pain.  Patient denies any fevers, chills, nausea or vomiting.    UA pyuria, hematuria and many bacteria.  Kaitlin [name] mentions that they have noticed some increase in confusion with Kaitlin [name] as of late.  PMH: Past Medical History:  Diagnosis Date   Alzheimer disease (HCC)    Cystitis    Diffuse cystic mastopathy    Gout    Hypertension    Osteopetrosis     Surgical History: Past Surgical History:  Procedure Laterality Date   BLADDER STONE REMOVAL  2008   BREAST EXCISIONAL  BIOPSY Right    benign; many years ago   BREAST LUMPECTOMY Right 1974   positive-no radiation or chemo per pt   COLONOSCOPY  1994   LAPAROSCOPY  2013   gallstone removal   NASAL SINUS SURGERY     OOPHORECTOMY Bilateral    PARATHYROIDECTOMY N/A 08/12/2020   Procedure: PARATHYROIDECTOMY with RNFA to assist;  Surgeon: Duanne Guess, MD;  Location: [facility] ORS;  Service: General;  Laterality: N/A;   TONSILLECTOMY     TOTAL ABDOMINAL HYSTERECTOMY W/ BILATERAL SALPINGOOPHORECTOMY  1980    Home Medications:  Allergies as of 02/19/2023       Reactions   Augmentin [amoxicillin-pot Clavulanate] Nausea And Vomiting, Rash   Tetracyclines & Related Rash   Tylenol [acetaminophen] Rash        Medication List        Accurate as of February 19, 2023 10:16 AM. If you have any questions, ask your nurse or doctor.          aspirin 81 MG tablet Take 81 mg by mouth daily.   Biotin 1000 MCG tablet Take 1,000 mcg by mouth daily.   donepezil 10 MG tablet Commonly known as: ARICEPT Take 1 tablet (10 mg total) by mouth at bedtime.   memantine 10 MG tablet Commonly known as: NAMENDA Take 1 tablet (10 mg total) by mouth 2 (two) times daily.   Osteo Bi-Flex Adv Triple St Tabs Take 1 tablet by mouth in the morning and at bedtime.   oxyCODONE  5 MG immediate release tablet Commonly known as: Roxicodone Take 1 tablet (5 mg total) by mouth every 8 (eight) hours as needed.   pravastatin 40 MG tablet Commonly known as: PRAVACHOL Take 1 tablet (40 mg total) by mouth daily.   predniSONE 10 MG tablet Commonly known as: DELTASONE Take 3 tablets (30 mg total) by mouth daily with breakfast.        Allergies:  Allergies  Allergen Reactions   Augmentin [Amoxicillin-Pot Clavulanate] Nausea And Vomiting and Rash   Tetracyclines & Related Rash   Tylenol [Acetaminophen] Rash    Family History: Family History  Problem Relation Age of Onset   Kidney Stones Brother    Dementia Mother     Aneurysm Mother    Other Father        unknown medical history   Breast cancer Neg Hx     Social History:  reports that she quit smoking about 32 years ago. Her smoking use included cigarettes. She has never used smokeless tobacco. She reports that she does not drink alcohol and does not use drugs.   Physical Exam: BP 112/75   Pulse 69   Ht 5\' 7"  (1.702 m)   Wt 160 lb (72.6 kg)   BMI 25.06 kg/m   Constitutional:  Well nourished. Alert and oriented, No acute distress. HEENT: [place] AT, moist mucus membranes.  Trachea midline Cardiovascular: No clubbing, cyanosis, or edema. Respiratory: Normal respiratory effort, no increased work of breathing. Neurologic: Grossly intact, no focal deficits, moving all 4 extremities. Psychiatric: Normal mood and affect.    Laboratory Data:    Latest Ref Rng & Units 11/25/2022    4:03 AM 11/23/2022    4:21 AM 11/22/2022   10:25 AM  BMP  Glucose 70 - 99 mg/dL 829  562  130   BUN 8 - 23 mg/dL 17  13  14    Creatinine 0.44 - 1.00 mg/dL 8.65  7.84  6.96   Sodium 135 - 145 mmol/L 137  139  140   Potassium 3.5 - 5.1 mmol/L 4.2  3.4  3.5   Chloride 98 - 111 mmol/L 106  108  107   CO2 22 - 32 mmol/L 25  24  22    Calcium 8.9 - 10.3 mg/dL 8.4  8.0  8.6     CBC    Component Value Date/Time   WBC 6.4 11/23/2022 0421   RBC 3.80 (L) 11/23/2022 0421   HGB 10.5 (L) 11/23/2022 0421   HGB 12.9 05/16/2014 0300   HCT 32.7 (L) 11/23/2022 0421   HCT 40.7 05/16/2014 0300   PLT 287 11/23/2022 0421   PLT 267 05/16/2014 0300   MCV 86.1 11/23/2022 0421   MCV 88 05/16/2014 0300   MCH 27.6 11/23/2022 0421   MCHC 32.1 11/23/2022 0421   RDW 15.2 11/23/2022 0421   RDW 14.6 (H) 05/16/2014 0300   LYMPHSABS 1.0 05/16/2014 0300   MONOABS 0.8 05/16/2014 0300   EOSABS 0.0 05/16/2014 0300   BASOSABS 0.0 05/16/2014 0300    Urinalysis See Epic and HPI I have reviewed the labs.   Pertinent Imaging: N/A  Assessment & Plan:    High risk hematuria  - former smoker -  Work-up in 11/2019 NED  -no reports of gross heme - UA 3-10 RBCs -Discussed that it has been 3 years since her last hematuria workup and if urine culture returns negative would she want to repeat hematuria workup with renal ultrasound and repeat cystoscopy, she stated that she  would -If urine culture is positive we will treat with culture appropriate antibiotic and then recheck in 2 months to see if the hematuria resolved with treating the infection  rUTI's  - Urine today is grossly positive; with micro heme, pyuria and bacteruria  - increase in confusion  - Urine sent for a culture, we will wait on culture results to prescribe an antibiotic  Nephrolithiasis  - Bilateral nephrolithiasis on CTU 11/2019  - Asymptomatic at this time   Vaginal atrophy  - asymptomatic   Return in about 2 months (around 04/21/2023) for repeat symptom recheck and UA .  Cloretta Ned   [facility] [address], [address] [place], [place] [address] [phone]

## 2023-02-19 ENCOUNTER — Ambulatory Visit (INDEPENDENT_AMBULATORY_CARE_PROVIDER_SITE_OTHER): Payer: Medicare Other | Admitting: Urology

## 2023-02-19 ENCOUNTER — Encounter: Payer: Self-pay | Admitting: Urology

## 2023-02-19 VITALS — BP 112/75 | HR 69 | Ht 67.0 in | Wt 160.0 lb

## 2023-02-19 DIAGNOSIS — N200 Calculus of kidney: Secondary | ICD-10-CM | POA: Diagnosis not present

## 2023-02-19 DIAGNOSIS — N952 Postmenopausal atrophic vaginitis: Secondary | ICD-10-CM | POA: Diagnosis not present

## 2023-02-19 DIAGNOSIS — N390 Urinary tract infection, site not specified: Secondary | ICD-10-CM

## 2023-02-19 DIAGNOSIS — R3129 Other microscopic hematuria: Secondary | ICD-10-CM | POA: Diagnosis not present

## 2023-02-19 DIAGNOSIS — Z87440 Personal history of urinary (tract) infections: Secondary | ICD-10-CM

## 2023-02-19 DIAGNOSIS — R8281 Pyuria: Secondary | ICD-10-CM

## 2023-02-19 DIAGNOSIS — R319 Hematuria, unspecified: Secondary | ICD-10-CM

## 2023-02-19 DIAGNOSIS — R8271 Bacteriuria: Secondary | ICD-10-CM

## 2023-02-19 LAB — URINALYSIS, COMPLETE
Bilirubin, UA: NEGATIVE
Glucose, UA: NEGATIVE
Ketones, UA: NEGATIVE
Nitrite, UA: NEGATIVE
Specific Gravity, UA: 1.02 (ref 1.005–1.030)
Urobilinogen, Ur: 0.2 mg/dL (ref 0.2–1.0)
pH, UA: 7 (ref 5.0–7.5)

## 2023-02-19 LAB — MICROSCOPIC EXAMINATION
Epithelial Cells (non renal): >10 /[HPF] — AB (ref 0–10)
WBC, UA: >30 /[HPF] — AB (ref 0–5)

## 2023-02-21 LAB — CULTURE, URINE COMPREHENSIVE

## 2023-02-22 ENCOUNTER — Telehealth: Payer: Self-pay | Admitting: *Deleted

## 2023-02-22 NOTE — Telephone Encounter (Signed)
-----   Message from [facility] sent at 02/21/2023  4:53 PM EDT ----- Please let Kaitlin [name] know that we will need to get a catheterized specimen from her because her urine culture grew out more than 3 different types of bacteria which may represent contamination.

## 2023-02-22 NOTE — Telephone Encounter (Signed)
Spoke with patient and she wanted me to call her friend [name] to schedule. .left message to have patient's friend return my call.   (Pt need to schedule visit with shannon for cath UA)

## 2023-02-22 NOTE — Telephone Encounter (Signed)
Spoke with patient and advised results   

## 2023-02-25 ENCOUNTER — Telehealth: Payer: Self-pay | Admitting: Nurse Practitioner

## 2023-02-25 NOTE — Telephone Encounter (Signed)
Okay for verbal orders.

## 2023-02-25 NOTE — Telephone Encounter (Signed)
Home Health Verbal Orders - Caller/Agency: lisa from [facility]  Callback Number: [phone]/states ok to leave a vm Requesting OT Frequency: 1x8

## 2023-02-25 NOTE — Telephone Encounter (Signed)
Okay to give verbal order.

## 2023-02-25 NOTE — Telephone Encounter (Signed)
Home Health Verbal Orders - Caller/Agency: Arline Asp with [facility]  Callback Number: [phone]  Requesting OT/PT/Skilled Nursing/Social Work/Speech Therapy: PT  Frequency: 1 week 8

## 2023-02-25 NOTE — Telephone Encounter (Signed)
Called Kaitlin [name] and provided verbal orders per Provider

## 2023-02-26 NOTE — Progress Notes (Unsigned)
There were no vitals taken for this visit.   Subjective:    Patient ID: Kaitlin [name], female    DOB: [birth year], 88 y.o.   MRN: [id number]  HPI: Kaitlin [name] is a 88 y.o. female  No chief complaint on file.  HYPERTENSION {Blank single:19197::"without","with"} Chronic Kidney Disease Hypertension status: {Blank single:19197::"controlled","uncontrolled","better","worse","exacerbated","stable"}  Satisfied with current treatment? {Blank single:19197::"yes","no"} Duration of hypertension: {Blank single:19197::"chronic","months","years"} BP monitoring frequency:  {Blank single:19197::"not checking","rarely","daily","weekly","monthly","a few times a day","a few times a week","a few times a month"} BP range:  BP medication side effects:  {Blank single:19197::"yes","no"} Medication compliance: {Blank single:19197::"excellent compliance","good compliance","fair compliance","poor compliance"} Previous BP meds:{Blank multiple:19196::"none","amlodipine","amlodipine/benazepril","atenolol","benazepril","benazepril/HCTZ","bisoprolol (bystolic)","carvedilol","chlorthalidone","clonidine","diltiazem","exforge HCT","HCTZ","irbesartan (avapro)","labetalol","lisinopril","lisinopril-HCTZ","losartan (cozaar)","methyldopa","nifedipine","olmesartan (benicar)","olmesartan-HCTZ","quinapril","ramipril","spironalactone","tekturna","valsartan","valsartan-HCTZ","verapamil"} Aspirin: {Blank single:19197::"yes","no"} Recurrent headaches: {Blank single:19197::"yes","no"} Visual changes: {Blank single:19197::"yes","no"} Palpitations: {Blank single:19197::"yes","no"} Dyspnea: {Blank single:19197::"yes","no"} Chest pain: {Blank single:19197::"yes","no"} Lower extremity edema: {Blank single:19197::"yes","no"} Dizzy/lightheaded: {Blank single:19197::"yes","no"}  MEMORY CONCERNS Relevant past medical, surgical, family and social history reviewed and updated as indicated. Interim medical history since our last visit  reviewed. Allergies and medications reviewed and updated.  Review of Systems  Per HPI unless specifically indicated above     Objective:    There were no vitals taken for this visit.  Wt Readings from Last 3 Encounters:  02/19/23 160 lb (72.6 kg)  12/27/22 155 lb 6.4 oz (70.5 kg)  12/25/22 151 lb 14.4 oz (68.9 kg)    Physical Exam  Results for orders placed or performed in visit on 02/19/23  CULTURE, URINE COMPREHENSIVE   Specimen: Urine   UR  Result Value Ref Range   Urine Culture, Comprehensive Final report    Organism ID, Bacteria Comment   Microscopic Examination   Urine  Result Value Ref Range   WBC, UA >30 (A) 0 - 5 /hpf   RBC, Urine 3-10 (A) 0 - 2 /hpf   Epithelial Cells (non renal) >10 (A) 0 - 10 /hpf   Mucus, UA Present (A) Not Estab.   Bacteria, UA Many (A) None seen/Few  Urinalysis, Complete  Result Value Ref Range   Specific Gravity, UA 1.020 1.005 - 1.030   pH, UA 7.0 5.0 - 7.5   Color, UA Yellow Yellow   Appearance Ur Cloudy (A) Clear   Leukocytes,UA 2+ (A) Negative   Protein,UA 1+ (A) Negative/Trace   Glucose, UA Negative Negative   Ketones, UA Negative Negative   RBC, UA Trace (A) Negative   Bilirubin, UA Negative Negative   Urobilinogen, Ur 0.2 0.2 - 1.0 mg/dL   Nitrite, UA Negative Negative   Microscopic Examination See below:       Assessment & Plan:   Problem List Items Addressed This Visit   None    Follow up plan: No follow-ups on file.

## 2023-02-26 NOTE — Telephone Encounter (Signed)
Called and LVM giving verbal orders per Clydie Braun.

## 2023-02-27 ENCOUNTER — Encounter: Payer: Self-pay | Admitting: Nurse Practitioner

## 2023-02-27 ENCOUNTER — Ambulatory Visit: Payer: Medicare Other | Admitting: Nurse Practitioner

## 2023-02-27 VITALS — BP 108/75 | HR 80 | Temp 98.3°F | Wt 144.2 lb

## 2023-02-27 DIAGNOSIS — D649 Anemia, unspecified: Secondary | ICD-10-CM | POA: Diagnosis not present

## 2023-02-27 DIAGNOSIS — E210 Primary hyperparathyroidism: Secondary | ICD-10-CM

## 2023-02-27 DIAGNOSIS — Z23 Encounter for immunization: Secondary | ICD-10-CM | POA: Diagnosis not present

## 2023-02-27 DIAGNOSIS — I10 Essential (primary) hypertension: Secondary | ICD-10-CM

## 2023-02-27 DIAGNOSIS — F0280 Dementia in other diseases classified elsewhere without behavioral disturbance: Secondary | ICD-10-CM

## 2023-02-27 DIAGNOSIS — G309 Alzheimer's disease, unspecified: Secondary | ICD-10-CM | POA: Diagnosis not present

## 2023-02-27 DIAGNOSIS — E042 Nontoxic multinodular goiter: Secondary | ICD-10-CM

## 2023-02-27 NOTE — Assessment & Plan Note (Signed)
Chronic.  Controlled.  Not currently on any medications.  Return to clinic in 3 months for reevaluation.  Labs ordered today.  Call sooner if concerns arise.

## 2023-02-27 NOTE — Assessment & Plan Note (Signed)
Chronic. Continue with current medication regimen.  She feels like she is doing well.  Has had recent episodes of dehydration and UTI.  Encouraged more water intake.  Presents with a friend to her visit today. Saw Dr. Sherryll Burger and was told to follow up annually.  Follow up in 3 months.  Call sooner if concerns arise.

## 2023-02-27 NOTE — Progress Notes (Unsigned)
In and Out Catheterization  Patient is present today for a I & O catheterization due to contaminated urine. Patient was cleaned and prepped in a sterile fashion with betadine . A 14 FR cath was inserted no complications were noted , 70 ml of urine return was noted, urine was yellow clear in color. A clean urine sample was collected for urine also send urine culture. Bladder was drained  And catheter was removed with out difficulty.    Performed by: Michiel Cowboy, PA-C   Follow up/ Additional notes: Urinalysis with pyuria bacteria and nitrate positive.  Urine sent for culture.

## 2023-02-27 NOTE — Assessment & Plan Note (Signed)
Labs ordered at visit today.  Will make recommendations based on lab results.   

## 2023-02-27 NOTE — Assessment & Plan Note (Signed)
Chronic.  Controlled.  Had partial parathyroidectomy in April/May in 2022.  Followed by Dr. Tedd Sias at [facility].  Return to clinic in 3 months for reevaluation.  Call sooner if concerns arise.

## 2023-02-28 ENCOUNTER — Ambulatory Visit (INDEPENDENT_AMBULATORY_CARE_PROVIDER_SITE_OTHER): Payer: Medicare Other | Admitting: Urology

## 2023-02-28 DIAGNOSIS — R319 Hematuria, unspecified: Secondary | ICD-10-CM

## 2023-02-28 DIAGNOSIS — R82998 Other abnormal findings in urine: Secondary | ICD-10-CM | POA: Diagnosis not present

## 2023-02-28 LAB — CBC WITH DIFFERENTIAL/PLATELET
Basophils Absolute: 0 10*3/uL (ref 0.0–0.2)
Basos: 0 %
EOS (ABSOLUTE): 0.1 10*3/uL (ref 0.0–0.4)
Eos: 1 %
Hematocrit: 40.3 % (ref 34.0–46.6)
Hemoglobin: 12.6 g/dL (ref 11.1–15.9)
Immature Grans (Abs): 0 10*3/uL (ref 0.0–0.1)
Immature Granulocytes: 0 %
Lymphocytes Absolute: 1.4 10*3/uL (ref 0.7–3.1)
Lymphs: 13 %
MCH: 26.8 pg (ref 26.6–33.0)
MCHC: 31.3 g/dL — ABNORMAL LOW (ref 31.5–35.7)
MCV: 86 fL (ref 79–97)
Monocytes Absolute: 1 10*3/uL — ABNORMAL HIGH (ref 0.1–0.9)
Monocytes: 9 %
Neutrophils Absolute: 8.1 10*3/uL — ABNORMAL HIGH (ref 1.4–7.0)
Neutrophils: 77 %
Platelets: 304 10*3/uL (ref 150–450)
RBC: 4.71 x10E6/uL (ref 3.77–5.28)
RDW: 15.6 % — ABNORMAL HIGH (ref 11.7–15.4)
WBC: 10.6 10*3/uL (ref 3.4–10.8)

## 2023-02-28 LAB — COMPREHENSIVE METABOLIC PANEL
ALT: 7 [IU]/L (ref 0–32)
AST: 12 [IU]/L (ref 0–40)
Albumin: 3.9 g/dL (ref 3.7–4.7)
Alkaline Phosphatase: 77 [IU]/L (ref 44–121)
BUN/Creatinine Ratio: 20 (ref 12–28)
BUN: 18 mg/dL (ref 8–27)
Bilirubin Total: 0.6 mg/dL (ref 0.0–1.2)
CO2: 21 mmol/L (ref 20–29)
Calcium: 9.3 mg/dL (ref 8.7–10.3)
Chloride: 102 mmol/L (ref 96–106)
Creatinine, Ser: 0.88 mg/dL (ref 0.57–1.00)
Globulin, Total: 3.2 g/dL (ref 1.5–4.5)
Glucose: 88 mg/dL (ref 70–99)
Potassium: 4.1 mmol/L (ref 3.5–5.2)
Sodium: 139 mmol/L (ref 134–144)
Total Protein: 7.1 g/dL (ref 6.0–8.5)
eGFR: 64 mL/min/{1.73_m2} (ref >59)

## 2023-02-28 LAB — T4, FREE: Free T4: 1.3 ng/dL (ref 0.82–1.77)

## 2023-02-28 LAB — TSH: TSH: 1.4 u[IU]/mL (ref 0.450–4.500)

## 2023-02-28 NOTE — Progress Notes (Signed)
Please let Ms. Kaitlin [name] know that her lab work has improved.  Her complete blood count shows that she is no longer anemic.  Kidney function has improved.  No concerns at this time.

## 2023-03-01 LAB — MICROSCOPIC EXAMINATION: WBC, UA: >30 /[HPF] — AB (ref 0–5)

## 2023-03-01 LAB — URINALYSIS, COMPLETE
Bilirubin, UA: NEGATIVE
Glucose, UA: NEGATIVE
Ketones, UA: NEGATIVE
Nitrite, UA: POSITIVE — AB
RBC, UA: NEGATIVE
Specific Gravity, UA: 1.015 (ref 1.005–1.030)
Urobilinogen, Ur: 2 mg/dL — ABNORMAL HIGH (ref 0.2–1.0)
pH, UA: 6 (ref 5.0–7.5)

## 2023-03-04 LAB — CULTURE, URINE COMPREHENSIVE

## 2023-03-05 ENCOUNTER — Other Ambulatory Visit: Payer: Self-pay | Admitting: *Deleted

## 2023-03-05 MED ORDER — SULFAMETHOXAZOLE-TRIMETHOPRIM 800-160 MG PO TABS: 1.0000 | ORAL_TABLET | Freq: Two times a day (BID) | ORAL | 0 refills | Status: AC

## 2023-04-05 ENCOUNTER — Encounter: Payer: Self-pay | Admitting: Emergency Medicine

## 2023-04-05 ENCOUNTER — Ambulatory Visit
Admission: EM | Admit: 2023-04-05 | Discharge: 2023-04-05 | Disposition: A | Discharge status: Home / Self Care | Payer: Medicare Other | Attending: Physician Assistant | Admitting: Physician Assistant

## 2023-04-05 ENCOUNTER — Ambulatory Visit: Payer: Self-pay | Admitting: *Deleted

## 2023-04-05 DIAGNOSIS — M7989 Other specified soft tissue disorders: Secondary | ICD-10-CM | POA: Diagnosis not present

## 2023-04-05 DIAGNOSIS — M79641 Pain in right hand: Secondary | ICD-10-CM | POA: Diagnosis not present

## 2023-04-05 DIAGNOSIS — Z8739 Personal history of other diseases of the musculoskeletal system and connective tissue: Secondary | ICD-10-CM

## 2023-04-05 MED ORDER — COLCHICINE 0.6 MG PO TABS: ORAL_TABLET | ORAL | 0 refills | Status: DC

## 2023-04-05 MED ORDER — INDOMETHACIN 50 MG PO CAPS: 50.0000 mg | ORAL_CAPSULE | Freq: Two times a day (BID) | ORAL | 0 refills | Status: AC

## 2023-04-05 MED ORDER — KETOROLAC TROMETHAMINE 60 MG/2ML IM SOLN
30.0000 mg | Freq: Once | INTRAMUSCULAR | Status: AC
  Administered 2023-04-05: 30 mg via INTRAMUSCULAR

## 2023-04-05 NOTE — Discharge Instructions (Signed)
-  Ice your hand to help with swelling.  Elevate it. - We gave you an injection of anti-inflammatory medication in the clinic tonight.  You may take a dose of the indomethacin before bed tonight with food. - Start colchicine. - Increase rest and fluids. - Follow-up with your primary care provider.

## 2023-04-05 NOTE — Telephone Encounter (Signed)
Routing to provider. UC note is in the chart.

## 2023-04-05 NOTE — ED Provider Notes (Signed)
[facility]    CSN: [id number] Arrival date & time: 04/05/23  1140      History   Chief Complaint Chief Complaint  Patient presents with   Hand Pain   Wrist Pain    HPI Kaitlin [name] is a 88 y.o. female presenting with a friend for right hand pain/swelling intermittently. She has swelling and pain of the index and middle fingers and pain moving these fingers. No numbness or weakness. She does have a history of gout in her hand and has take colchicine previously which helped. History of gout in right hand x 3. Her friend believes she has gout again and would like her to take colchicine again. She has not taken any other meds at home. Allergy to Tylenol.  HPI  Past Medical History:  Diagnosis Date   Alzheimer disease (HCC)    Cystitis    Diffuse cystic mastopathy    Gout    Hypertension    Osteopetrosis     Patient Active Problem List   Diagnosis Date Noted   Hypophosphatemia 11/25/2022   Hypocalcemia 11/25/2022   Weakness 11/22/2022   UTI (urinary tract infection) 11/22/2022   Sinus arrhythmia seen on electrocardiogram 11/13/2022   Hypokalemia 11/11/2022   Asymptomatic bacteriuria 09/11/2022   Advanced care planning/counseling discussion 06/07/2022   Stage 3b chronic kidney disease (HCC) 06/29/2021   S/P parathyroidectomy 08/16/2020   History of nephrolithiasis 10/28/2018   Hyperlipidemia 01/02/2017   Mitral regurgitation 01/02/2017   Heart palpitations 06/23/2014   SOB (shortness of breath) on exertion 06/23/2014   Hyperparathyroidism, primary (HCC) 05/12/2014   Multiple thyroid nodules 05/12/2014   Osteoporosis 05/12/2014   Primary osteoarthritis of left knee 03/23/2014   Internal derangement of left knee 01/14/2014   Alzheimer disease (HCC) 09/21/2013   Atrophic vaginitis 07/23/2013   Benign neoplasm of colon, unspecified 07/23/2013   Essential (primary) hypertension 07/23/2013   Gouty arthritis 07/23/2013   Nephrolithiasis 07/23/2013    Urinary incontinence 07/23/2013   Hypercalcemia 07/23/2013   Fibrocystic breast disease 12/18/2012    Past Surgical History:  Procedure Laterality Date   BLADDER STONE REMOVAL  2008   BREAST EXCISIONAL BIOPSY Right    benign; many years ago   BREAST LUMPECTOMY Right 1974   positive-no radiation or chemo per pt   COLONOSCOPY  1994   LAPAROSCOPY  2013   gallstone removal   NASAL SINUS SURGERY     OOPHORECTOMY Bilateral    PARATHYROIDECTOMY N/A 08/12/2020   Procedure: PARATHYROIDECTOMY with RNFA to assist;  Surgeon: Duanne Guess, MD;  Location: [facility] ORS;  Service: General;  Laterality: N/A;   TONSILLECTOMY     TOTAL ABDOMINAL HYSTERECTOMY W/ BILATERAL SALPINGOOPHORECTOMY  1980    OB History     Gravida  2   Para  2   Term      Preterm      AB      Living  2      SAB      IAB      Ectopic      Multiple      Live Births           Obstetric Comments  Menstrual: 14 Age 1st Pregnancy: 26          Home Medications    Prior to Admission medications   Medication Sig Start Date End Date Taking? Authorizing Provider  colchicine 0.6 MG tablet Take 2 caps po x 1, then 1 cap po 1 hr later x  1 04/05/23  Yes Eusebio Friendly B, PA-C  indomethacin (INDOCIN) 50 MG capsule Take 1 capsule (50 mg total) by mouth 2 (two) times daily with a meal for 7 days. 04/05/23 04/12/23 Yes Shirlee Latch, PA-C  aspirin 81 MG tablet Take 81 mg by mouth daily.    [provider]  Biotin 1000 MCG tablet Take 1,000 mcg by mouth daily.    [provider]  donepezil (ARICEPT) 10 MG tablet Take 1 tablet (10 mg total) by mouth at bedtime. 06/07/22   Larae Grooms, NP  memantine (NAMENDA) 10 MG tablet Take 1 tablet (10 mg total) by mouth 2 (two) times daily. 06/07/22   Larae Grooms, NP  Misc Natural Products (OSTEO BI-FLEX ADV TRIPLE ST) TABS Take 1 tablet by mouth in the morning and at bedtime.    [provider]  pravastatin (PRAVACHOL) 40 MG tablet  Take 1 tablet (40 mg total) by mouth daily. 06/07/22   Larae Grooms, NP    Family History Family History  Problem Relation Age of Onset   Kidney Stones Brother    Dementia Mother    Aneurysm Mother    Other Father        unknown medical history   Breast cancer Neg Hx     Social History Social History   Tobacco Use   Smoking status: Former    Current packs/day: 0.00    Types: Cigarettes    Quit date: 08/14/1990    Years since quitting: 32.6   Smokeless tobacco: Never  Vaping Use   Vaping status: Never Used  Substance Use Topics   Alcohol use: No   Drug use: No     Allergies   Augmentin [amoxicillin-pot clavulanate], Tetracyclines & related, and Tylenol [acetaminophen]   Review of Systems Review of Systems  Musculoskeletal:  Positive for arthralgias and joint swelling.  Skin:  Negative for color change and wound.  Neurological:  Negative for weakness.     Physical Exam Triage Vital Signs ED Triage Vitals  Encounter Vitals Group     BP      Systolic BP Percentile      Diastolic BP Percentile      Pulse      Resp      Temp      Temp src      SpO2      Weight      Height      Head Circumference      Peak Flow      Pain Score      Pain Loc      Pain Education      Exclude from Growth Chart    No data found.  Updated Vital Signs BP (!) 151/81 (BP Location: Left Arm)   Pulse 65   Temp (!) 97.4 F (36.3 C) (Oral)   Resp 14   Ht 5\' 7"  (1.702 m)   Wt 144 lb 2.9 oz (65.4 kg)   SpO2 96%   BMI 22.58 kg/m     Physical Exam Vitals and nursing note reviewed.  Constitutional:      General: She is not in acute distress.    Appearance: Normal appearance. She is not ill-appearing or toxic-appearing.  HENT:     Head: Normocephalic and atraumatic.  Eyes:     General: No scleral icterus.       Right eye: No discharge.        Left eye: No discharge.     Conjunctiva/sclera:  Conjunctivae normal.  Cardiovascular:     Rate and Rhythm: Normal rate and  regular rhythm.     Pulses: Normal pulses.  Pulmonary:     Effort: Pulmonary effort is normal. No respiratory distress.  Musculoskeletal:     Right hand: Swelling present. Decreased range of motion.     Cervical back: Neck supple.     Comments: RIGHT HAND: there is moderate swelling of 2nd and 3rd MCPs. Area is TTP with light touch. No erythema or increased warmth.   Skin:    General: Skin is dry.  Neurological:     General: No focal deficit present.     Mental Status: She is alert. Mental status is at baseline.     Motor: No weakness.     Gait: Gait normal.  Psychiatric:        Mood and Affect: Mood normal.      UC Treatments / Results  Labs (all labs ordered are listed, but only abnormal results are displayed) Labs Reviewed - No data to display  EKG   Radiology No results found.  Procedures Procedures (including critical care time)  Medications Ordered in UC Medications  ketorolac (TORADOL) injection 30 mg (30 mg Intramuscular Given 04/05/23 1234)    Initial Impression / Assessment and Plan / UC Course  I have reviewed the triage vital signs and the nursing notes.  Pertinent labs & imaging results that were available during my care of the patient were reviewed by me and considered in my medical decision making (see chart for details).   88 year old female presents for pain and swelling of the right hand, specifically over the second and third MCP joints for the past couple days.  Her friend reports that she has a history of gout in her hand 3 times.  She has taken colchicine it was the only thing that helped her.  On exam she has swelling about the second and third MCP joints and mildly throughout the second and third fingers but no increased erythema or warmth.  No wounds.  Difficulty moving his fingers due to stiffness and discomfort.  Good pulses and strength.  Unsure if her symptoms are due to gout. Possible other arthritis.  Does not exactly fit presentation of  gout but they are insistent she has a history of gout in her hands and they request colchicine.  Sent colchicine to pharmacy for 2 doses.  Patient was given ketorolac injection in clinic and I also sent in about the same.  Discussed following RICE guidelines and advised following up with PCP especially if symptoms do not improve.   Final Clinical Impressions(s) / UC Diagnoses   Final diagnoses:  Right hand pain  Swelling of right hand  History of gout     Discharge Instructions      -Ice your hand to help with swelling.  Elevate it. - We gave you an injection of anti-inflammatory medication in the clinic tonight.  You may take a dose of the indomethacin before bed tonight with food. - Start colchicine. - Increase rest and fluids. - Follow-up with your primary care provider.     ED Prescriptions     Medication Sig Dispense Auth. Provider   colchicine 0.6 MG tablet Take 2 caps po x 1, then 1 cap po 1 hr later x 1 3 tablet Eusebio Friendly B, PA-C   indomethacin (INDOCIN) 50 MG capsule Take 1 capsule (50 mg total) by mouth 2 (two) times daily with a meal for 7 days.  14 capsule Shirlee Latch, PA-C      PDMP not reviewed this encounter.   Shirlee Latch, PA-C 04/05/23 1246

## 2023-04-05 NOTE — ED Triage Notes (Signed)
Patient reports pain in her right hand and wrist that started yesterday.  Patient denies injury or fall.

## 2023-04-05 NOTE — Telephone Encounter (Signed)
  Chief Complaint: requesting medication Rx for colchicine 0.6 mg and indomethacin 50 mg prior to Thanksgiving for prevent gout flare up Symptoms: pain in right hand.  Frequency: today  Pertinent Negatives: Patient denies na  Disposition: [] ED /[] Urgent Care (no appt availability in office) / [] Appointment(In office/virtual)/ []  [facility]/ [] Home Care/ [] Refused Recommended Disposition /[] [facility]/ [x]  Follow-up with PCP Additional Notes:   Requesting Rxs for next week . Went to UC today due to gout pain received indomethacin and colchicine. Requesting PCP to prescribe. Please advise if appt needed and if so please call patient back.    Summary: Rx refill   Dondra Spry calling on behalf of patient. Pt seen at [facility] today for gout in right hand. Pt prescribed colchicine and indomethacin for the gout. Pt has enough for this weekend. Pt requesting more of the prescriptions before Thanksgiving weekend.  Pt would like to discuss the refills with clinical staff. Dondra Spry requesting call back, [phone].           Reason for Disposition  Prescription request for new medicine (not a refill)  Answer Assessment - Initial Assessment Questions 1. NAME of MEDICINE: "What medicine(s) are you calling about?"     Colchicine 0.6 mg and indomethacin 50 mg 2. QUESTION: "What is your question?" (e.g., double dose of medicine, side effect)     Can PCP prescribe medications to have on hand for gout flare ups? 3. PRESCRIBER: "Who prescribed the medicine?" Reason: if prescribed by specialist, call should be referred to that group.     [facility]  4. SYMPTOMS: "Do you have any symptoms?" If Yes, ask: "What symptoms are you having?"  "How bad are the symptoms (e.g., mild, moderate, severe)     Gout now  5. PREGNANCY:  "Is there any chance that you are pregnant?" "When was your last menstrual period?"     na  Protocols used: Medication Question Call-A-AH

## 2023-04-08 NOTE — Telephone Encounter (Signed)
Patient will need an appt

## 2023-04-08 NOTE — Telephone Encounter (Signed)
Attempted to reach patient, LVM to call office back to get scheduled for an appointment to be able to receive refills on a medication that she got from UC.  Put in CRM.

## 2023-04-16 ENCOUNTER — Ambulatory Visit (INDEPENDENT_AMBULATORY_CARE_PROVIDER_SITE_OTHER): Payer: Medicare Other | Admitting: Family Medicine

## 2023-04-16 VITALS — BP 138/80 | HR 69 | Temp 97.6°F | Ht 66.54 in | Wt 142.6 lb

## 2023-04-16 DIAGNOSIS — M10041 Idiopathic gout, right hand: Secondary | ICD-10-CM | POA: Insufficient documentation

## 2023-04-16 MED ORDER — ALLOPURINOL 100 MG PO TABS: 100.0000 mg | ORAL_TABLET | Freq: Every day | ORAL | 1 refills | Status: DC

## 2023-04-16 NOTE — Assessment & Plan Note (Signed)
Chronic, stable. Previously treated at urgent care 2 weeks ago for flare with short course of Indomethacin and Colchicine. CMP obtained today. Will start Allopurinol 100 MG daily and change dose as needed based on kidney function. Provided instruction on gout flares, low purine diet, and educated to decrease intake of high purine foods. Return in 1 month, call sooner if concerns arise.

## 2023-04-16 NOTE — Patient Instructions (Addendum)
You can eat certain foods that are medium-high in purines, but eat them only once in a while. These foods include: Legumes, such as dried beans and dried peas. Asparagus, cauliflower, spinach, mushrooms, and green peas. Fish and seafood (other than very high-purine seafood). Oatmeal, wheat bran, and wheat germ.  Limit very high-purine foods, including: Organ meats, such as liver, kidneys, sweetbreads, and brains. Meats, including bacon, beef, pork, and lamb. Game meats and any other meats in large amounts. Anchovies, sardines, herring, mackerel, and scallops. Gravy. Alcohol, especially beer and hard liquor.

## 2023-04-16 NOTE — Progress Notes (Signed)
BP 138/80   Pulse 69   Temp 97.6 F (36.4 C) (Oral)   Ht 5' 6.54" (1.69 m)   Wt 142 lb 9.6 oz (64.7 kg)   SpO2 96%   BMI 22.65 kg/m    Subjective:    Patient ID: Kaitlin [name], female    DOB: [birth year], 88 y.o.   MRN: [id number]  HPI: Kaitlin [name] is a 88 y.o. female  Chief Complaint  Patient presents with   Follow-up    Here to follow up from urgent care visit for treatment of gout of the right hand.    Patient visited urgent care 2 weeks ago for swelling and pain of the right index and middle fingers and pain moving those fingers. She has history of gout. She was treated with Toradol injection in office and Indomethacin and Colchicine. Today she is not currently having a gout flare and is interested in starting a preventative medication for this.   GOUT Duration: Years Right 1st metatarsophalangeal pain: yes Left 1st metatarsophalangeal pain: no Right knee pain: no Left knee pain: no Severity: mild  Quality:  Swelling: no Redness: no Trauma: no Recent dietary change or indiscretion: no Fevers: no Nausea/vomiting: no Aggravating factors: Alleviating factors:  Status:  better Treatments attempted: Indomethacin, colchicine, and Toradol injection   Relevant past medical, surgical, family and social history reviewed and updated as indicated. Interim medical history since our last visit reviewed. Allergies and medications reviewed and updated.  Review of Systems  Respiratory: Negative.    Cardiovascular: Negative.   Musculoskeletal: Negative.     Per HPI unless specifically indicated above     Objective:    BP 138/80   Pulse 69   Temp 97.6 F (36.4 C) (Oral)   Ht 5' 6.54" (1.69 m)   Wt 142 lb 9.6 oz (64.7 kg)   SpO2 96%   BMI 22.65 kg/m   Wt Readings from Last 3 Encounters:  04/16/23 142 lb 9.6 oz (64.7 kg)  04/05/23 144 lb 2.9 oz (65.4 kg)  02/27/23 144 lb 3.2 oz (65.4 kg)    Physical Exam Vitals and nursing note reviewed.  Constitutional:       General: She is awake. She is not in acute distress.    Appearance: Normal appearance. She is well-developed and well-groomed. She is not ill-appearing.  HENT:     Head: Normocephalic and atraumatic.     Right Ear: Hearing and external ear normal. No drainage.     Left Ear: Hearing and external ear normal. No drainage.     Nose: Nose normal.  Eyes:     General: Lids are normal.        Right eye: No discharge.        Left eye: No discharge.     Conjunctiva/sclera: Conjunctivae normal.  Cardiovascular:     Rate and Rhythm: Normal rate and regular rhythm.     Pulses:          Radial pulses are 2+ on the right side and 2+ on the left side.       Posterior tibial pulses are 2+ on the right side and 2+ on the left side.     Heart sounds: Normal heart sounds, S1 normal and S2 normal. No murmur heard.    No gallop.  Pulmonary:     Effort: Pulmonary effort is normal. No accessory muscle usage or respiratory distress.     Breath sounds: Normal breath sounds.  Musculoskeletal:  General: Normal range of motion.     Right hand: No swelling, deformity, tenderness or bony tenderness. Normal range of motion. Normal strength. Normal pulse.     Left hand: No swelling, deformity, tenderness or bony tenderness. Normal range of motion. Normal strength. Normal pulse.     Cervical back: Full passive range of motion without pain and normal range of motion.     Right lower leg: No edema.     Left lower leg: No edema.  Skin:    General: Skin is warm and dry.     Capillary Refill: Capillary refill takes less than 2 seconds.  Neurological:     Mental Status: She is alert and oriented to person, place, and time.  Psychiatric:        Attention and Perception: Attention normal.        Mood and Affect: Mood normal.        Speech: Speech normal.        Behavior: Behavior normal. Behavior is cooperative.        Thought Content: Thought content normal.     Results for orders placed or performed  in visit on 02/28/23  CULTURE, URINE COMPREHENSIVE   Specimen: Urine   UR  Result Value Ref Range   Urine Culture, Comprehensive Final report (A)    Organism ID, Bacteria Citrobacter koseri (A)    ANTIMICROBIAL SUSCEPTIBILITY Comment   Microscopic Examination   Urine  Result Value Ref Range   WBC, UA >30 (A) 0 - 5 /hpf   RBC, Urine 0-2 0 - 2 /hpf   Epithelial Cells (non renal) 0-10 0 - 10 /hpf   Casts Present (A) None seen /lpf   Cast Type Hyaline casts N/A   Mucus, UA Present (A) Not Estab.   Bacteria, UA Many (A) None seen/Few  Urinalysis, Complete  Result Value Ref Range   Specific Gravity, UA 1.015 1.005 - 1.030   pH, UA 6.0 5.0 - 7.5   Color, UA Yellow Yellow   Appearance Ur Clear Clear   Leukocytes,UA 1+ (A) Negative   Protein,UA Trace (A) Negative/Trace   Glucose, UA Negative Negative   Ketones, UA Negative Negative   RBC, UA Negative Negative   Bilirubin, UA Negative Negative   Urobilinogen, Ur 2.0 (H) 0.2 - 1.0 mg/dL   Nitrite, UA Positive (A) Negative   Microscopic Examination See below:       Assessment & Plan:   Problem List Items Addressed This Visit     Acute idiopathic gout of right hand - Primary    Chronic, stable. Previously treated at urgent care 2 weeks ago for flare with short course of Indomethacin and Colchicine. CMP obtained today. Will start Allopurinol 100 MG daily and change dose as needed based on kidney function. Provided instruction on gout flares, low purine diet, and educated to decrease intake of high purine foods. Return in 1 month, call sooner if concerns arise.       Relevant Medications   allopurinol (ZYLOPRIM) 100 MG tablet   Other Relevant Orders   Comp Met (CMET)     Follow up plan: Return in about 6 weeks (around 05/30/2023).

## 2023-04-17 LAB — COMPREHENSIVE METABOLIC PANEL
ALT: 7 [IU]/L (ref 0–32)
AST: 12 [IU]/L (ref 0–40)
Albumin: 4 g/dL (ref 3.7–4.7)
Alkaline Phosphatase: 74 [IU]/L (ref 44–121)
BUN/Creatinine Ratio: 17 (ref 12–28)
BUN: 17 mg/dL (ref 8–27)
Bilirubin Total: 0.7 mg/dL (ref 0.0–1.2)
CO2: 23 mmol/L (ref 20–29)
Calcium: 9.7 mg/dL (ref 8.7–10.3)
Chloride: 105 mmol/L (ref 96–106)
Creatinine, Ser: 1.02 mg/dL — ABNORMAL HIGH (ref 0.57–1.00)
Globulin, Total: 2.4 g/dL (ref 1.5–4.5)
Glucose: 82 mg/dL (ref 70–99)
Potassium: 3.7 mmol/L (ref 3.5–5.2)
Sodium: 143 mmol/L (ref 134–144)
Total Protein: 6.4 g/dL (ref 6.0–8.5)
eGFR: 54 mL/min/{1.73_m2} — ABNORMAL LOW (ref >59)

## 2023-04-18 NOTE — Progress Notes (Unsigned)
04/23/23 10:42 AM   Kaitlin [name] 1934-10-02 [id number]  Referring provider:  Larae Grooms, NP [address] [place],  [place] [address]  Urological history  1. High risk hematuria - Former smoker - CTU (11/2019) Small bilateral nonobstructive renal calculi. There is prominence of the left renal pelvis and the ureters are both mildly patulous to the ureterovesicular junction without overt hydronephrosis. No evidence of obstructing lesion or filling defect on delayed phase imaging - Cysto (11/2019) NED   2. rUTI's - contributing factors of age, Alzheimer's, vaginal atrophy, incontinence and constipation - documented urine cultures over the last year   October 17th, 2024 - Citrobacter koseri   November 22, 2022 - Enterococcus Faecium and VRE  November 11, 2022 -multiple species present     3. Nephrolithiasis - CTU (11/2019) small bilateral nonobstructive stones   4. Vaginal atrophy - not bothersome    5. Urgency - managing conservatively   HPI: Kaitlin [name] is a 88 y.o.female who returns today for 2 month follow up her friend, Kaitlin [name].    Previous records reviewed.   She is feeling well.  She has minimal incontinence which she manages with small pads.  Patient denies any modifying or aggravating factors.  Patient denies any recent UTI's, gross hematuria, dysuria or suprapubic/flank pain.  Patient denies any fevers, chills, nausea or vomiting.    UA 6-10 WBCs, 0-2 RBCs, 0-10 epithelial cells, amorphous sediment present, mucus threads present many bacteria. PMH: Past Medical History:  Diagnosis Date   Alzheimer disease (HCC)    Cystitis    Diffuse cystic mastopathy    Gout    Hypertension    Osteopetrosis     Surgical History: Past Surgical History:  Procedure Laterality Date   BLADDER STONE REMOVAL  2008   BREAST EXCISIONAL BIOPSY Right    benign; many years ago   BREAST LUMPECTOMY Right 1974   positive-no radiation or chemo per pt   COLONOSCOPY  1994    LAPAROSCOPY  2013   gallstone removal   NASAL SINUS SURGERY     OOPHORECTOMY Bilateral    PARATHYROIDECTOMY N/A 08/12/2020   Procedure: PARATHYROIDECTOMY with RNFA to assist;  Surgeon: Duanne Guess, MD;  Location: [facility] ORS;  Service: General;  Laterality: N/A;   TONSILLECTOMY     TOTAL ABDOMINAL HYSTERECTOMY W/ BILATERAL SALPINGOOPHORECTOMY  1980    Home Medications:  Allergies as of 04/23/2023       Reactions   Augmentin [amoxicillin-pot Clavulanate] Nausea And Vomiting, Rash   Tetracyclines & Related Rash   Tylenol [acetaminophen] Rash        Medication List        Accurate as of April 23, 2023 10:42 AM. If you have any questions, ask your nurse or doctor.          allopurinol 100 MG tablet Commonly known as: ZYLOPRIM Take 1 tablet (100 mg total) by mouth daily.   aspirin 81 MG tablet Take 81 mg by mouth daily.   Biotin 1000 MCG tablet Take 1,000 mcg by mouth daily.   colchicine 0.6 MG tablet Take 2 caps po x 1, then 1 cap po 1 hr later x 1   donepezil 10 MG tablet Commonly known as: ARICEPT Take 1 tablet (10 mg total) by mouth at bedtime.   memantine 10 MG tablet Commonly known as: NAMENDA Take 1 tablet (10 mg total) by mouth 2 (two) times daily.   Osteo Bi-Flex Adv Triple St Tabs Take 1 tablet by mouth  in the morning and at bedtime.   pravastatin 40 MG tablet Commonly known as: PRAVACHOL Take 1 tablet (40 mg total) by mouth daily.        Allergies:  Allergies  Allergen Reactions   Augmentin [Amoxicillin-Pot Clavulanate] Nausea And Vomiting and Rash   Tetracyclines & Related Rash   Tylenol [Acetaminophen] Rash    Family History: Family History  Problem Relation Age of Onset   Kidney Stones Brother    Dementia Mother    Aneurysm Mother    Other Father        unknown medical history   Breast cancer Neg Hx     Social History:  reports that she quit smoking about 32 years ago. Her smoking use included cigarettes. She has never  used smokeless tobacco. She reports that she does not drink alcohol and does not use drugs.   Physical Exam: BP 130/82   Pulse 70   Constitutional:  Well nourished. Alert and oriented, No acute distress. HEENT: [place] AT, moist mucus membranes.  Trachea midline Cardiovascular: No clubbing, cyanosis, or edema. Respiratory: Normal respiratory effort, no increased work of breathing. Neurologic: Grossly intact, no focal deficits, moving all 4 extremities. Psychiatric: Normal mood and affect.    Laboratory Data:    Latest Ref Rng & Units 04/16/2023   10:10 AM 02/27/2023   11:26 AM 11/25/2022    4:03 AM  BMP  Glucose 70 - 99 mg/dL 82  88  829   BUN 8 - 27 mg/dL 17  18  17    Creatinine 0.57 - 1.00 mg/dL 5.62  1.30  8.65   BUN/Creat Ratio 12 - 28 17  20     Sodium 134 - 144 mmol/L 143  139  137   Potassium 3.5 - 5.2 mmol/L 3.7  4.1  4.2   Chloride 96 - 106 mmol/L 105  102  106   CO2 20 - 29 mmol/L 23  21  25    Calcium 8.7 - 10.3 mg/dL 9.7  9.3  8.4     Urinalysis See Epic and HPI I have reviewed the labs.   Pertinent Imaging: N/A  Assessment & Plan:    High risk hematuria  - former smoker - Work-up in 11/2019 NED  -no reports of gross heme - UA negative for micro heme  rUTI's  -Asymptomatic today  Nephrolithiasis  - Bilateral nephrolithiasis on CTU 11/2019  - Asymptomatic at this time   Vaginal atrophy  - asymptomatic   Return in about 1 year (around 04/22/2024) for UA, OAB, PVR .  Cloretta Ned   [facility] [address] [place], [place] [address] [phone]

## 2023-04-18 NOTE — Progress Notes (Signed)
Hi, Can you let Kaitlin [name] know her labs returned as stable. Electrolytes are normal, liver function is normal, and kidney levels remain stable. It is recommended she remain on the dose of Allopurinol 100 MG daily  that was prescribed for her. Thank you.

## 2023-04-22 ENCOUNTER — Telehealth: Payer: Self-pay | Admitting: Nurse Practitioner

## 2023-04-22 NOTE — Telephone Encounter (Signed)
Okay for verbal order

## 2023-04-22 NOTE — Telephone Encounter (Signed)
Called and LVM giving verbal orders per Clydie Braun.

## 2023-04-22 NOTE — Telephone Encounter (Signed)
Copied from CRM [id number]. Topic: Quick Communication - Home Health Verbal Orders >> Apr 22, 2023  1:54 PM Macon Large wrote: Caller/Agency: Arline Asp with [facility] Number: [phone] Service Requested: Physical Therapy Frequency: 1 x 9  Any new concerns about the patient? No

## 2023-04-23 ENCOUNTER — Encounter: Payer: Self-pay | Admitting: Urology

## 2023-04-23 ENCOUNTER — Ambulatory Visit: Payer: Medicare Other | Admitting: Urology

## 2023-04-23 VITALS — BP 130/82 | HR 70

## 2023-04-23 DIAGNOSIS — Z87440 Personal history of urinary (tract) infections: Secondary | ICD-10-CM

## 2023-04-23 DIAGNOSIS — N200 Calculus of kidney: Secondary | ICD-10-CM | POA: Diagnosis not present

## 2023-04-23 DIAGNOSIS — N952 Postmenopausal atrophic vaginitis: Secondary | ICD-10-CM

## 2023-04-23 DIAGNOSIS — N390 Urinary tract infection, site not specified: Secondary | ICD-10-CM

## 2023-04-23 DIAGNOSIS — R319 Hematuria, unspecified: Secondary | ICD-10-CM

## 2023-04-23 LAB — URINALYSIS, COMPLETE
Bilirubin, UA: NEGATIVE
Glucose, UA: NEGATIVE
Ketones, UA: NEGATIVE
Nitrite, UA: NEGATIVE
Protein,UA: NEGATIVE
RBC, UA: NEGATIVE
Specific Gravity, UA: 1.015 (ref 1.005–1.030)
Urobilinogen, Ur: 0.2 mg/dL (ref 0.2–1.0)
pH, UA: 7 (ref 5.0–7.5)

## 2023-04-23 LAB — MICROSCOPIC EXAMINATION

## 2023-05-30 ENCOUNTER — Ambulatory Visit: Payer: Medicare Other | Admitting: Nurse Practitioner

## 2023-06-16 DIAGNOSIS — G309 Alzheimer's disease, unspecified: Secondary | ICD-10-CM | POA: Diagnosis not present

## 2023-06-16 DIAGNOSIS — M109 Gout, unspecified: Secondary | ICD-10-CM

## 2023-06-16 DIAGNOSIS — E785 Hyperlipidemia, unspecified: Secondary | ICD-10-CM

## 2023-06-16 DIAGNOSIS — F0280 Dementia in other diseases classified elsewhere without behavioral disturbance: Secondary | ICD-10-CM

## 2023-06-16 DIAGNOSIS — E1122 Type 2 diabetes mellitus with diabetic chronic kidney disease: Secondary | ICD-10-CM | POA: Diagnosis not present

## 2023-06-16 DIAGNOSIS — I129 Hypertensive chronic kidney disease with stage 1 through stage 4 chronic kidney disease, or unspecified chronic kidney disease: Secondary | ICD-10-CM | POA: Diagnosis not present

## 2023-06-16 DIAGNOSIS — I498 Other specified cardiac arrhythmias: Secondary | ICD-10-CM

## 2023-06-16 DIAGNOSIS — E210 Primary hyperparathyroidism: Secondary | ICD-10-CM

## 2023-06-16 DIAGNOSIS — I051 Rheumatic mitral insufficiency: Secondary | ICD-10-CM

## 2023-06-16 DIAGNOSIS — N1832 Chronic kidney disease, stage 3b: Secondary | ICD-10-CM | POA: Diagnosis not present

## 2023-06-18 ENCOUNTER — Telehealth: Payer: Self-pay | Admitting: Nurse Practitioner

## 2023-06-18 NOTE — Telephone Encounter (Signed)
Home Health Verbal Orders - Caller/Agency: indy from [facility] Callback Number: [phone] Service Requested: Physical Therapy Frequency: 1x8 Any new concerns about the patient? yes

## 2023-06-19 NOTE — Telephone Encounter (Signed)
 Okay to give verbal order.

## 2023-06-19 NOTE — Telephone Encounter (Signed)
 Called and LVM giving verbal orders per Clydie Braun.

## 2023-07-17 ENCOUNTER — Ambulatory Visit (INDEPENDENT_AMBULATORY_CARE_PROVIDER_SITE_OTHER)

## 2023-07-17 ENCOUNTER — Ambulatory Visit
Admission: EM | Admit: 2023-07-17 | Discharge: 2023-07-17 | Disposition: A | Discharge status: Home / Self Care | Attending: Physician Assistant | Admitting: Physician Assistant

## 2023-07-17 DIAGNOSIS — M47816 Spondylosis without myelopathy or radiculopathy, lumbar region: Secondary | ICD-10-CM | POA: Diagnosis not present

## 2023-07-17 DIAGNOSIS — M1611 Unilateral primary osteoarthritis, right hip: Secondary | ICD-10-CM

## 2023-07-17 DIAGNOSIS — M545 Low back pain, unspecified: Secondary | ICD-10-CM

## 2023-07-17 DIAGNOSIS — M25551 Pain in right hip: Secondary | ICD-10-CM

## 2023-07-17 MED ORDER — MELOXICAM 7.5 MG PO TABS: 7.5000 mg | ORAL_TABLET | Freq: Every day | ORAL | 0 refills | Status: AC | PRN

## 2023-07-17 NOTE — Discharge Instructions (Addendum)
-  I will be in contact with results. It is possible results may not come back tonight.  -At this time I advise anti-inflammatory medication, rest, ice, heat.  You should follow-up with physical therapy and orthopedics.  Results PT  [address] [place], [place] [address] Phone: [phone]  You have a condition requiring you to follow up with Orthopedics so please call one of the following office for appointment:   [facility] Address: [address], [place], [place] [address] Phone: [phone]  [facility] [address] [place], [place] [address] Phone: [phone]  [facility] [address] [place], [place] [address] Phone: [phone]

## 2023-07-17 NOTE — ED Triage Notes (Signed)
 Pt c/o R hip pain x5 days. Denies any falls or injuries. Hx of gout. Has tried allopurinol w/o relief.

## 2023-07-17 NOTE — ED Provider Notes (Signed)
 [facility]    CSN: [id number] Arrival date & time: 07/17/23  1605      History   Chief Complaint Chief Complaint  Patient presents with   Hip Pain    HPI Kaitlin [name] is a 89 y.o. female with history of Alzheimer's, gout, hypertension and osteoporosis.  Presenting with her friend for right sided hip pain and lower back pain for the past 5 days.  Denies falls or injury.  Patient denies any radiation of pain down her leg, numbness/tingling or weakness.  No swelling. No pain on weightbearing. Increased pain with position change from sitting to standing. No history of back problems or hip issues.  Patient has been taking Advil without relief.  Denies any flank pain, dysuria, frequency, abdominal pain, vomiting, fever.  No other concerns.  HPI  Past Medical History:  Diagnosis Date   Alzheimer disease (HCC)    Cystitis    Diffuse cystic mastopathy    Gout    Hypertension    Osteopetrosis     Patient Active Problem List   Diagnosis Date Noted   Acute idiopathic gout of right hand 04/16/2023   Hypophosphatemia 11/25/2022   Hypocalcemia 11/25/2022   Weakness 11/22/2022   UTI (urinary tract infection) 11/22/2022   Sinus arrhythmia seen on electrocardiogram 11/13/2022   Hypokalemia 11/11/2022   Asymptomatic bacteriuria 09/11/2022   Advanced care planning/counseling discussion 06/07/2022   Stage 3b chronic kidney disease (HCC) 06/29/2021   S/P parathyroidectomy 08/16/2020   History of nephrolithiasis 10/28/2018   Hyperlipidemia 01/02/2017   Mitral regurgitation 01/02/2017   Heart palpitations 06/23/2014   SOB (shortness of breath) on exertion 06/23/2014   Hyperparathyroidism, primary (HCC) 05/12/2014   Multiple thyroid nodules 05/12/2014   Osteoporosis 05/12/2014   Primary osteoarthritis of left knee 03/23/2014   Internal derangement of left knee 01/14/2014   Alzheimer disease (HCC) 09/21/2013   Atrophic vaginitis 07/23/2013   Benign neoplasm of colon,  unspecified 07/23/2013   Essential (primary) hypertension 07/23/2013   Gouty arthritis 07/23/2013   Nephrolithiasis 07/23/2013   Urinary incontinence 07/23/2013   Hypercalcemia 07/23/2013   Fibrocystic breast disease 12/18/2012    Past Surgical History:  Procedure Laterality Date   BLADDER STONE REMOVAL  2008   BREAST EXCISIONAL BIOPSY Right    benign; many years ago   BREAST LUMPECTOMY Right 1974   positive-no radiation or chemo per pt   COLONOSCOPY  1994   LAPAROSCOPY  2013   gallstone removal   NASAL SINUS SURGERY     OOPHORECTOMY Bilateral    PARATHYROIDECTOMY N/A 08/12/2020   Procedure: PARATHYROIDECTOMY with RNFA to assist;  Surgeon: Duanne Guess, MD;  Location: [facility];  Service: General;  Laterality: N/A;   TONSILLECTOMY     TOTAL ABDOMINAL HYSTERECTOMY W/ BILATERAL SALPINGOOPHORECTOMY  1980    OB History     Gravida  2   Para  2   Term      Preterm      AB      Living  2      SAB      IAB      Ectopic      Multiple      Live Births           Obstetric Comments  Menstrual: 14 Age 1st Pregnancy: 26          Home Medications    Prior to Admission medications   Medication Sig Start Date End Date Taking? Authorizing Provider  allopurinol (ZYLOPRIM)  100 MG tablet Take 1 tablet (100 mg total) by mouth daily. 04/16/23  Yes Pearley, Sherran Needs, NP  aspirin 81 MG tablet Take 81 mg by mouth daily.   Yes [provider]  Biotin 1000 MCG tablet Take 1,000 mcg by mouth daily.   Yes [provider]  donepezil (ARICEPT) 10 MG tablet Take 1 tablet (10 mg total) by mouth at bedtime. 06/07/22  Yes Larae Grooms, NP  meloxicam (MOBIC) 7.5 MG tablet Take 1 tablet (7.5 mg total) by mouth daily as needed for pain. 07/17/23 08/16/23 Yes Shirlee Latch, PA-C  memantine (NAMENDA) 10 MG tablet Take 1 tablet (10 mg total) by mouth 2 (two) times daily. 06/07/22  Yes Larae Grooms, NP  Misc Natural Products (OSTEO BI-FLEX ADV TRIPLE  ST) TABS Take 1 tablet by mouth in the morning and at bedtime.   Yes [provider]  pravastatin (PRAVACHOL) 40 MG tablet Take 1 tablet (40 mg total) by mouth daily. 06/07/22  Yes Larae Grooms, NP  colchicine 0.6 MG tablet Take 2 caps po x 1, then 1 cap po 1 hr later x 1 04/05/23   Shirlee Latch, PA-C    Family History Family History  Problem Relation Age of Onset   Kidney Stones Brother    Dementia Mother    Aneurysm Mother    Other Father        unknown medical history   Breast cancer Neg Hx     Social History Social History   Tobacco Use   Smoking status: Former    Current packs/day: 0.00    Types: Cigarettes    Quit date: 08/14/1990    Years since quitting: 32.9   Smokeless tobacco: Never  Vaping Use   Vaping status: Never Used  Substance Use Topics   Alcohol use: No   Drug use: No     Allergies   Amoxicillin-pot clavulanate, Tetracycline, Tetracyclines & related, and Tylenol [acetaminophen]   Review of Systems Review of Systems  Constitutional:  Negative for fatigue.  Genitourinary:  Negative for difficulty urinating, dysuria and flank pain.  Musculoskeletal:  Positive for arthralgias and back pain. Negative for gait problem and joint swelling.  Skin:  Negative for color change and wound.  Neurological:  Negative for weakness and numbness.     Physical Exam Triage Vital Signs ED Triage Vitals  Encounter Vitals Group     BP 07/17/23 1613 (!) 132/97     Systolic BP Percentile --      Diastolic BP Percentile --      Pulse Rate 07/17/23 1613 96     Resp 07/17/23 1613 16     Temp 07/17/23 1613 98 F (36.7 C)     Temp Source 07/17/23 1613 Oral     SpO2 07/17/23 1613 98 %     Weight 07/17/23 1612 142 lb 10.2 oz (64.7 kg)     Height 07/17/23 1612 5\' 6"  (1.676 m)     Head Circumference --      Peak Flow --      Pain Score 07/17/23 1617 10     Pain Loc --      Pain Education --      Exclude from Growth Chart --    No data  found.  Updated Vital Signs BP (!) 132/97 (BP Location: Left Arm)   Pulse 96   Temp 98 F (36.7 C) (Oral)   Resp 16   Ht 5\' 6"  (1.676 m)   Wt  142 lb 10.2 oz (64.7 kg)   SpO2 98%   BMI 23.02 kg/m      Physical Exam Vitals and nursing note reviewed.  Constitutional:      General: She is not in acute distress.    Appearance: Normal appearance. She is not ill-appearing or toxic-appearing.  HENT:     Head: Normocephalic and atraumatic.  Eyes:     General: No scleral icterus.       Right eye: No discharge.        Left eye: No discharge.     Conjunctiva/sclera: Conjunctivae normal.  Cardiovascular:     Rate and Rhythm: Normal rate and regular rhythm.     Heart sounds: Normal heart sounds.  Pulmonary:     Effort: Pulmonary effort is normal. No respiratory distress.     Breath sounds: Normal breath sounds.  Abdominal:     Palpations: Abdomen is soft.     Tenderness: There is no abdominal tenderness. There is no right CVA tenderness or left CVA tenderness.  Musculoskeletal:     Cervical back: Neck supple.     Comments: Back/hip: There is tenderness diffusely throughout the L-spine and right paravertebral muscles.  Tenderness over the SI joint on the right and lateral hip.  Negative straight leg raise.  Increased pain of right hip with internal and external rotation.  Normal gait.  Skin:    General: Skin is dry.  Neurological:     General: No focal deficit present.     Mental Status: She is alert. Mental status is at baseline.     Motor: No weakness.     Gait: Gait normal.  Psychiatric:        Mood and Affect: Mood normal.      UC Treatments / Results  Labs (all labs ordered are listed, but only abnormal results are displayed) Labs Reviewed - No data to display  EKG   Radiology No results found.  Procedures Procedures (including critical care time)  Medications Ordered in UC Medications - No data to display  Initial Impression / Assessment and Plan / UC  Course  I have reviewed the triage vital signs and the nursing notes.  Pertinent labs & imaging results that were available during my care of the patient were reviewed by me and considered in my medical decision making (see chart for details).   89 year old female presents with her friend and neighbor for 5-day history of lower back pain and right hip pain.  Denies falls or injury.  Patient does have history of Alzheimer's and her friend is helping to provide the medical history.  She has been taking Advil without relief.  Stray of right hip and l-spine obtained.  *** Final Clinical Impressions(s) / UC Diagnoses   Final diagnoses:  Pain of right hip  Acute midline low back pain without sciatica     Discharge Instructions      -I will be in contact with results. It is possible results may not come back tonight.  -At this time I advise anti-inflammatory medication, rest, ice, heat.  You should follow-up with physical therapy and orthopedics.  Results PT  [address] [place], [place] [address] Phone: [phone]  You have a condition requiring you to follow up with Orthopedics so please call one of the following office for appointment:   [facility] Address: [address] [place], [place] [address] Phone: [phone]  [facility] [address] [place], [place], [place] [address] Phone: [phone]  [facility] [address] [place], [place] [address] Phone: [phone]     ED Prescriptions     Medication Sig Dispense Auth. Provider   meloxicam (MOBIC) 7.5 MG tablet Take 1 tablet (7.5 mg total) by mouth daily as needed for pain. 30 tablet Gareth Morgan      PDMP not reviewed this encounter.

## 2023-07-18 ENCOUNTER — Other Ambulatory Visit: Payer: Self-pay | Admitting: Nurse Practitioner

## 2023-07-18 ENCOUNTER — Ambulatory Visit: Payer: Self-pay | Admitting: Nurse Practitioner

## 2023-07-18 NOTE — Telephone Encounter (Signed)
 Spoke to patient's neighbor on behalf of the patient who went to [facility] yesterday for severe pain. Patient was treated for arthritis and prescribed Meloxicam. Neighbor stated the medication is managing the pain and she is not symptomatic at this time. Neighbor was seeking further clarification on Meloxicam. This RN advised patient to take 1 tablet by mouth daily as needed for pain, as instructed by provider.   Copied from CRM [id number]. Topic: Clinical - Red Word Triage >> Jul 18, 2023  2:22 PM Ivette P wrote: Red Word that prompted transfer to Nurse Triage: 03/05 could barley move, pain in the hip. [employer] called saying something was wrong. Neighbor took too Urgent care prompt care 03/05 -diagnosed with arthitis  on a very strong pain killer, history of gout. if blood test possible Reason for Disposition  Health Information question, no triage required and triager able to answer question  Protocols used: Information Only Call - No Triage-A-AH

## 2023-07-18 NOTE — Telephone Encounter (Unsigned)
 Copied from CRM [id number]. Topic: Clinical - Medication Refill >> Jul 18, 2023  3:12 PM Ja-Kwan M wrote: Most Recent Primary Care Visit:  Provider: Prescott Gum RHONDA  Department: [facility]  Visit Type: OFFICE VISIT  Date: 04/16/2023  Medication: donepezil (ARICEPT) 10 MG tablet, amLODipine (NORVASC) 5 MG tablet, and pravastatin (PRAVACHOL) 40 MG tablet                                           ---  Attention: ---  Patient requests that a Rx refill for donepezil (ARICEPT) 10 MG tablet be sent to local pharmacy because she will be out before it is delivered to her home.   Has the patient contacted their pharmacy? Yes  Is this the correct pharmacy for this prescription? Yes If no, delete pharmacy and type the correct one.  This is the patient's preferred pharmacy:  [facility] - [place], [place] - [address] [place] [address] Phone: [phone] Fax: [phone]  Has the prescription been filled recently? No  Is the patient out of the medication? No  Has the patient been seen for an appointment in the last year OR does the patient have an upcoming appointment? Yes  Can we respond through MyChart? No  Agent: Please be advised that Rx refills may take up to 3 business days. We ask that you follow-up with your pharmacy.

## 2023-07-19 MED ORDER — PRAVASTATIN SODIUM 40 MG PO TABS: 40.0000 mg | ORAL_TABLET | Freq: Every day | ORAL | 1 refills | Status: DC

## 2023-07-19 MED ORDER — PRAVASTATIN SODIUM 40 MG PO TABS: 40.0000 mg | ORAL_TABLET | Freq: Every day | ORAL | 0 refills | Status: DC

## 2023-07-19 NOTE — Telephone Encounter (Signed)
 Patient is requesting [facility] [place] [facility]. Patient states that she cannot wait for the mail order pharmacy because she only has one pill left.  Please call patient to advise when prescription is called in.

## 2023-07-19 NOTE — Telephone Encounter (Signed)
 D/C 11/13/22. Requested Prescriptions  Signed Prescriptions Disp Refills   pravastatin (PRAVACHOL) 40 MG tablet 90 tablet 1    Sig: TAKE 1 TABLET DAILY     Cardiovascular:  Antilipid - Statins Failed - 07/19/2023 11:10 AM      Failed - Lipid Panel in normal range within the last 12 months    Cholesterol, Total  Date Value Ref Range Status  09/11/2022 148 100 - 199 mg/dL Final   Cholesterol  Date Value Ref Range Status  09/01/2011 94 0 - 200 mg/dL Final   Ldl Cholesterol, Calc  Date Value Ref Range Status  09/01/2011 57 0 - 100 mg/dL Final   LDL Chol Calc (NIH)  Date Value Ref Range Status  09/11/2022 71 0 - 99 mg/dL Final   HDL Cholesterol  Date Value Ref Range Status  09/01/2011 4 (L) 40 - 60 mg/dL Final   HDL  Date Value Ref Range Status  09/11/2022 66 >39 mg/dL Final   Triglycerides  Date Value Ref Range Status  09/11/2022 50 0 - 149 mg/dL Final  41/32/4401 027 0 - 200 mg/dL Final         Passed - Patient is not pregnant      Passed - Valid encounter within last 12 months    Recent Outpatient Visits           3 months ago Acute idiopathic gout of right hand   [place] [facility] Pearley, Sherran Needs, NP   4 months ago Hyperparathyroidism, primary [facility])   [place] [facility] Larae Grooms, NP   6 months ago DNR (do not resuscitate) discussion   [place] [facility] Mecum, Oswaldo Conroy, PA-C   9 months ago Elbow pain, left   [place] [facility] Pearley, Sherran Needs, NP   10 months ago Essential (primary) hypertension   [place] [facility] Larae Grooms, NP       Future Appointments             In 1 month [place], Melton Alar, [place] [facility] Primary Care & Sports Medicine at [facility]   In 9 months McGowan, Elana Alm [facility] Urology [place]            Refused Prescriptions Disp Refills   amLODipine (NORVASC) 5 MG tablet [Pharmacy Med  Name: AMLODIPINE BESYLATE TABS 5MG ] 90 tablet 3    Sig: TAKE 1 TABLET DAILY     Cardiovascular: Calcium Channel Blockers 2 Failed - 07/19/2023 11:10 AM      Failed - Last BP in normal range    BP Readings from Last 1 Encounters:  07/17/23 (!) 132/97         Passed - Last Heart Rate in normal range    Pulse Readings from Last 1 Encounters:  07/17/23 96         Passed - Valid encounter within last 6 months    Recent Outpatient Visits           3 months ago Acute idiopathic gout of right hand   [place] [facility] [place], Sherran Needs, NP   4 months ago Hyperparathyroidism, primary [facility])   [facility] Larae Grooms, NP   6 months ago DNR (do not resuscitate) discussion   [place] [facility] Mecum, Oswaldo Conroy, PA-C   9 months ago Elbow pain, left   [place] [facility] [place], Sherran Needs, NP  10 months ago Essential (primary) hypertension   [place] Larae Grooms, NP       Future Appointments             In 1 month [place], Melton Alar, [place] [facility] Health Primary Care & Sports Medicine at [facility]   In 9 months McGowan, Elana Alm [facility]

## 2023-07-19 NOTE — Telephone Encounter (Signed)
 Requested Prescriptions  Pending Prescriptions Disp Refills   pravastatin (PRAVACHOL) 40 MG tablet [Pharmacy Med Name: PRAVASTATIN TABS 40MG ] 90 tablet 1    Sig: TAKE 1 TABLET DAILY     Cardiovascular:  Antilipid - Statins Failed - 07/19/2023 11:08 AM      Failed - Lipid Panel in normal range within the last 12 months    Cholesterol, Total  Date Value Ref Range Status  09/11/2022 148 100 - 199 mg/dL Final   Cholesterol  Date Value Ref Range Status  09/01/2011 94 0 - 200 mg/dL Final   Ldl Cholesterol, Calc  Date Value Ref Range Status  09/01/2011 57 0 - 100 mg/dL Final   LDL Chol Calc (NIH)  Date Value Ref Range Status  09/11/2022 71 0 - 99 mg/dL Final   HDL Cholesterol  Date Value Ref Range Status  09/01/2011 4 (L) 40 - 60 mg/dL Final   HDL  Date Value Ref Range Status  09/11/2022 66 >39 mg/dL Final   Triglycerides  Date Value Ref Range Status  09/11/2022 50 0 - 149 mg/dL Final  16/02/9603 540 0 - 200 mg/dL Final         Passed - Patient is not pregnant      Passed - Valid encounter within last 12 months    Recent Outpatient Visits           3 months ago Acute idiopathic gout of right hand   [place] [facility] Pearley, Sherran Needs, NP   4 months ago Hyperparathyroidism, primary [facility])   [place] [facility] Larae Grooms, NP   6 months ago DNR (do not resuscitate) discussion   [place] [facility] Mecum, Oswaldo Conroy, PA-C   9 months ago Elbow pain, left   [place] [facility] Pearley, Sherran Needs, NP   10 months ago Essential (primary) hypertension   [place] [facility] Larae Grooms, NP       Future Appointments             In 1 month [place], Melton Alar, [place] [facility] Health Primary Care & Sports Medicine at [facility]   In 9 months McGowan, [place], PA-C [facility] Urology [place]             amLODipine (NORVASC) 5 MG tablet [Pharmacy Med Name:  AMLODIPINE BESYLATE TABS 5MG ] 90 tablet 3    Sig: TAKE 1 TABLET DAILY     Cardiovascular: Calcium Channel Blockers 2 Failed - 07/19/2023 11:08 AM      Failed - Last BP in normal range    BP Readings from Last 1 Encounters:  07/17/23 (!) 132/97         Passed - Last Heart Rate in normal range    Pulse Readings from Last 1 Encounters:  07/17/23 96         Passed - Valid encounter within last 6 months    Recent Outpatient Visits           3 months ago Acute idiopathic gout of right hand   [place] [facility] [place], Sherran Needs, NP   4 months ago Hyperparathyroidism, primary [facility])   [place] [facility] Larae Grooms, NP   6 months ago DNR (do not resuscitate) discussion   [place] [facility] Mecum, Oswaldo Conroy, PA-C   9 months ago Elbow pain, left    [facility], Sherran Needs, NP  10 months ago Essential (primary) hypertension   [place] [facility] Larae Grooms, NP       Future Appointments             In 1 month [place], Melton Alar, [place] [facility] Health Primary Care & Sports Medicine at [facility]   In 9 months McGowan, Elana Alm [facility]rology [facility]

## 2023-07-19 NOTE — Telephone Encounter (Signed)
 Medication sent to the pharmacy.  Patient needs follow up visit.

## 2023-07-22 ENCOUNTER — Other Ambulatory Visit: Payer: Self-pay | Admitting: Nurse Practitioner

## 2023-07-22 NOTE — Telephone Encounter (Unsigned)
 Copied from CRM [id number]. Topic: Clinical - Prescription Issue >> Jul 22, 2023  2:59 PM Carlatta H wrote: Reason for CRM: Patient donepezil (ARICEPT) 10 MG tablet [[id number]] prescription is pending and she has no more medication//Please Call Nonnie Done about prescription [phone]//

## 2023-07-22 NOTE — Telephone Encounter (Signed)
 Copied from CRM [id number]. Topic: Clinical - Medication Refill >> Jul 22, 2023 10:51 AM [name] wrote: Most Recent Primary Care Visit:  Provider: Weber Cooks  Department: [facility]  Visit Type: OFFICE VISIT  Date: 04/16/2023  Medication: donepezil (ARICEPT) 10 MG tablet   Has the patient contacted their pharmacy? No  Is this the correct pharmacy for this prescription? Yes If no, delete pharmacy and type the correct one.  This is the patient's preferred pharmacy:   [id number] [place] - [address] [place] [address] Phone: [phone] Fax: [phone]  Has the prescription been filled recently? Yes  Is the patient out of the medication? Yes  Has the patient been seen for an appointment in the last year OR does the patient have an upcoming appointment? Yes  Can we respond through MyChart? Yes  Agent: Please be advised that Rx refills may take up to 3 business days. We ask that you follow-up with your pharmacy.  [name] patients friend called stated she has not been on her medication since Saturday and need it asap for dementia

## 2023-07-23 ENCOUNTER — Encounter: Payer: Self-pay | Admitting: Nurse Practitioner

## 2023-07-23 ENCOUNTER — Ambulatory Visit (INDEPENDENT_AMBULATORY_CARE_PROVIDER_SITE_OTHER): Admitting: Nurse Practitioner

## 2023-07-23 VITALS — BP 121/78 | HR 75 | Ht 66.0 in | Wt 147.6 lb

## 2023-07-23 DIAGNOSIS — E210 Primary hyperparathyroidism: Secondary | ICD-10-CM

## 2023-07-23 DIAGNOSIS — E785 Hyperlipidemia, unspecified: Secondary | ICD-10-CM

## 2023-07-23 DIAGNOSIS — G309 Alzheimer's disease, unspecified: Secondary | ICD-10-CM | POA: Diagnosis not present

## 2023-07-23 DIAGNOSIS — F0280 Dementia in other diseases classified elsewhere without behavioral disturbance: Secondary | ICD-10-CM

## 2023-07-23 DIAGNOSIS — N1832 Chronic kidney disease, stage 3b: Secondary | ICD-10-CM | POA: Diagnosis not present

## 2023-07-23 DIAGNOSIS — I10 Essential (primary) hypertension: Secondary | ICD-10-CM

## 2023-07-23 MED ORDER — DONEPEZIL HCL 10 MG PO TABS: 10.0000 mg | ORAL_TABLET | Freq: Every day | ORAL | 0 refills | Status: DC

## 2023-07-23 MED ORDER — DONEPEZIL HCL 10 MG PO TABS
10.0000 mg | ORAL_TABLET | Freq: Every day | ORAL | 1 refills | Status: DC
Start: 2023-07-23 — End: 2023-07-23

## 2023-07-23 MED ORDER — ALLOPURINOL 100 MG PO TABS
100.0000 mg | ORAL_TABLET | Freq: Every day | ORAL | 1 refills | Status: DC
Start: 2023-07-23 — End: 2024-01-30

## 2023-07-23 MED ORDER — PRAVASTATIN SODIUM 40 MG PO TABS
40.0000 mg | ORAL_TABLET | Freq: Every day | ORAL | 1 refills | Status: DC
Start: 2023-07-23 — End: 2024-01-30

## 2023-07-23 MED ORDER — COLCHICINE 0.6 MG PO TABS: ORAL_TABLET | ORAL | 0 refills | Status: AC

## 2023-07-23 MED ORDER — MEMANTINE HCL 10 MG PO TABS: 10.0000 mg | ORAL_TABLET | Freq: Two times a day (BID) | ORAL | 1 refills | Status: DC

## 2023-07-23 NOTE — Assessment & Plan Note (Signed)
 Chronic.  Controlled.  Had partial parathyroidectomy in April/May in 2022.  Followed by Dr. Tedd Sias at [facility].  She is up to date on her follow ups.  Return to clinic in 3 months for reevaluation.  Call sooner if concerns arise.

## 2023-07-23 NOTE — Progress Notes (Signed)
 BP 121/78 (BP Location: Left Arm, Patient Position: Sitting, Cuff Size: Large)   Pulse 75   Ht 5\' 6"  (1.676 m)   Wt 147 lb 9.6 oz (67 kg)   SpO2 95%   BMI 23.82 kg/m    Subjective:    Patient ID: Kaitlin [name], female    DOB: 15-May-1934, 89 y.o.   MRN: [id number]  HPI: Kaitlin [name] is a 89 y.o. female  Chief Complaint  Patient presents with   Medication Management    Donepezil   HYPERTENSION without Chronic Kidney Disease Hypertension status: controlled  Satisfied with current treatment? yes Duration of hypertension: years BP monitoring frequency:  not checking BP range:  BP medication side effects:  no Medication compliance: excellent compliance Previous BP meds:none Aspirin: no Recurrent headaches: no Visual changes: no Palpitations: no Dyspnea: no Chest pain: no Lower extremity edema: no Dizzy/lightheaded: no  MEMORY CONCERNS Patient is doing well.  She has two close friends, Olegario Messier and Dondra Spry who look out for her.  Her Son, Casimiro Needle is in [place].  She also has a Location manager in Social worker, Zollie Beckers, in the area.  She is getting meals on wheels also.  She feels like her memory fluctuates.  Her friend who regularly helps her and brings her to appointments states he short term memory is not great.  She is no longer reusing pads.  She is going to the senior center for activities.  She has been out of her Donzepil due to miscommunication with our office on refills.   Patient is followed by Urology, Nephrology and Neurology.  Seeing the specialists annually.      Relevant past medical, surgical, family and social history reviewed and updated as indicated. Interim medical history since our last visit reviewed. Allergies and medications reviewed and updated.  Review of Systems  Eyes:  Negative for visual disturbance.  Respiratory:  Negative for cough, chest tightness and shortness of breath.   Cardiovascular:  Negative for chest pain, palpitations and leg swelling.   Neurological:  Negative for dizziness and headaches.    Per HPI unless specifically indicated above     Objective:    BP 121/78 (BP Location: Left Arm, Patient Position: Sitting, Cuff Size: Large)   Pulse 75   Ht 5\' 6"  (1.676 m)   Wt 147 lb 9.6 oz (67 kg)   SpO2 95%   BMI 23.82 kg/m   Wt Readings from Last 3 Encounters:  07/23/23 147 lb 9.6 oz (67 kg)  07/17/23 142 lb 10.2 oz (64.7 kg)  04/16/23 142 lb 9.6 oz (64.7 kg)    Physical Exam Vitals and nursing note reviewed.  Constitutional:      General: She is not in acute distress.    Appearance: Normal appearance. She is normal weight. She is not ill-appearing, toxic-appearing or diaphoretic.  HENT:     Head: Normocephalic.     Right Ear: External ear normal.     Left Ear: External ear normal.     Nose: Nose normal.     Mouth/Throat:     Mouth: Mucous membranes are moist.     Pharynx: Oropharynx is clear.  Eyes:     General:        Right eye: No discharge.        Left eye: No discharge.     Extraocular Movements: Extraocular movements intact.     Conjunctiva/sclera: Conjunctivae normal.     Pupils: Pupils are equal, round, and reactive to light.  Cardiovascular:  Rate and Rhythm: Normal rate and regular rhythm.     Heart sounds: No murmur heard. Pulmonary:     Effort: Pulmonary effort is normal. No respiratory distress.     Breath sounds: Normal breath sounds. No wheezing or rales.  Musculoskeletal:     Cervical back: Normal range of motion and neck supple.  Skin:    General: Skin is warm and dry.     Capillary Refill: Capillary refill takes less than 2 seconds.  Neurological:     General: No focal deficit present.     Mental Status: She is alert and oriented to person, place, and time. Mental status is at baseline.  Psychiatric:        Mood and Affect: Mood normal.        Behavior: Behavior normal.        Thought Content: Thought content normal.        Judgment: Judgment normal.     Results for orders  placed or performed in visit on 04/23/23  Microscopic Examination   Collection Time: 04/23/23 10:27 AM   Urine  Result Value Ref Range   WBC, UA 6-10 (A) 0 - 5 /hpf   RBC, Urine 0-2 0 - 2 /hpf   Epithelial Cells (non renal) 0-10 0 - 10 /hpf   Crystals Present (A) N/A   Crystal Type Amorphous Sediment N/A   Mucus, UA Present (A) Not Estab.   Bacteria, UA Many (A) None seen/Few  Urinalysis, Complete   Collection Time: 04/23/23 10:27 AM  Result Value Ref Range   Specific Gravity, UA 1.015 1.005 - 1.030   pH, UA 7.0 5.0 - 7.5   Color, UA Yellow Yellow   Appearance Ur Cloudy (A) Clear   Leukocytes,UA Trace (A) Negative   Protein,UA Negative Negative/Trace   Glucose, UA Negative Negative   Ketones, UA Negative Negative   RBC, UA Negative Negative   Bilirubin, UA Negative Negative   Urobilinogen, Ur 0.2 0.2 - 1.0 mg/dL   Nitrite, UA Negative Negative   Microscopic Examination See below:       Assessment & Plan:   Problem List Items Addressed This Visit       Cardiovascular and Mediastinum   Essential (primary) hypertension   Chronic.  Controlled.  Not currently on any medications.  Return to clinic in 3 months for reevaluation.  Labs ordered today.  Call sooner if concerns arise.       Relevant Medications   pravastatin (PRAVACHOL) 40 MG tablet   Other Relevant Orders   Comprehensive metabolic panel     Endocrine   Hyperparathyroidism, primary (HCC)   Chronic.  Controlled.  Had partial parathyroidectomy in April/May in 2022.  Followed by Dr. Tedd Sias at [facility]  She is up to date on her follow ups.  Return to clinic in 3 months for reevaluation.  Call sooner if concerns arise.         Nervous and Auditory   Alzheimer disease (HCC) - Primary   Chronic.  Ongoing concern.  Has a great support symptom around her with friends who look in on her closely.  Receives meals on wheels, does memory games and tries to remain active.  She has been out of her Donepezil.  Refilled  a short supply to the local pharmacy today.  Long term prescriptions were sent to [facility].  Followed by Neurology annually.  Has an appt later this month.  CMP and CBC checked at visit today.  Follow up in  3 months.  Call sooner if concerns arise.       Relevant Medications   memantine (NAMENDA) 10 MG tablet   donepezil (ARICEPT) 10 MG tablet     Genitourinary   Stage 3b chronic kidney disease (HCC)   Chronic. Followed by Nephrology- annually.  Next appt is later this month.  Labs ordered at visit today.  Follow up in 3 months.  Call sooner if concerns arise.        Relevant Orders   CBC w/Diff     Other   Hyperlipidemia   Chronic.  Controlled.  Continue with current medication regimen of Pravastatin daily.  Labs ordered today.  Return to clinic in 3 months for reevaluation.  Call sooner if concerns arise.       Relevant Medications   pravastatin (PRAVACHOL) 40 MG tablet     Follow up plan: Return in about 3 months (around 10/23/2023) for HTN, HLD, DM2 FU.  A total of 30 minutes were spent on this encounter today.  When total time is documented, this includes both the face-to-face and non-face-to-face time personally spent before, during and after the visit on the date of the encounter reviewing medications, where medications are filled, specialists and follow ups.

## 2023-07-23 NOTE — Assessment & Plan Note (Signed)
Chronic.  Controlled.  Continue with current medication regimen of Pravastatin daily.  Labs ordered today.  Return to clinic in 3 months for reevaluation.  Call sooner if concerns arise.

## 2023-07-23 NOTE — Assessment & Plan Note (Signed)
 Chronic. Followed by Nephrology- annually.  Next appt is later this month.  Labs ordered at visit today.  Follow up in 3 months.  Call sooner if concerns arise.

## 2023-07-23 NOTE — Assessment & Plan Note (Signed)
Chronic.  Controlled.  Not currently on any medications.  Return to clinic in 3 months for reevaluation.  Labs ordered today.  Call sooner if concerns arise.

## 2023-07-23 NOTE — Assessment & Plan Note (Signed)
 Chronic.  Ongoing concern.  Has a great support symptom around her with friends who look in on her closely.  Receives meals on wheels, does memory games and tries to remain active.  She has been out of her Donepezil.  Refilled a short supply to the local pharmacy today.  Long term prescriptions were sent to [facility].  Followed by Neurology annually.  Has an appt later this month.  CMP and CBC checked at visit today.  Follow up in 3 months.  Call sooner if concerns arise.

## 2023-07-24 ENCOUNTER — Encounter: Payer: Self-pay | Admitting: Nurse Practitioner

## 2023-07-24 LAB — CBC WITH DIFFERENTIAL/PLATELET
Basophils Absolute: 0.1 10*3/uL (ref 0.0–0.2)
Basos: 1 %
EOS (ABSOLUTE): 0.1 10*3/uL (ref 0.0–0.4)
Eos: 1 %
Hematocrit: 39.7 % (ref 34.0–46.6)
Hemoglobin: 13.2 g/dL (ref 11.1–15.9)
Immature Grans (Abs): 0 10*3/uL (ref 0.0–0.1)
Immature Granulocytes: 0 %
Lymphocytes Absolute: 1.3 10*3/uL (ref 0.7–3.1)
Lymphs: 20 %
MCH: 29 pg (ref 26.6–33.0)
MCHC: 33.2 g/dL (ref 31.5–35.7)
MCV: 87 fL (ref 79–97)
Monocytes Absolute: 0.5 10*3/uL (ref 0.1–0.9)
Monocytes: 8 %
Neutrophils Absolute: 4.6 10*3/uL (ref 1.4–7.0)
Neutrophils: 70 %
Platelets: 297 10*3/uL (ref 150–450)
RBC: 4.55 x10E6/uL (ref 3.77–5.28)
RDW: 13.2 % (ref 11.7–15.4)
WBC: 6.5 10*3/uL (ref 3.4–10.8)

## 2023-07-24 LAB — COMPREHENSIVE METABOLIC PANEL
ALT: 7 IU/L (ref 0–32)
AST: 14 IU/L (ref 0–40)
Albumin: 4 g/dL (ref 3.7–4.7)
Alkaline Phosphatase: 64 IU/L (ref 44–121)
BUN/Creatinine Ratio: 35 — ABNORMAL HIGH (ref 12–28)
BUN: 33 mg/dL — ABNORMAL HIGH (ref 8–27)
Bilirubin Total: 0.3 mg/dL (ref 0.0–1.2)
CO2: 20 mmol/L (ref 20–29)
Calcium: 9.4 mg/dL (ref 8.7–10.3)
Chloride: 106 mmol/L (ref 96–106)
Creatinine, Ser: 0.94 mg/dL (ref 0.57–1.00)
Globulin, Total: 2.1 g/dL (ref 1.5–4.5)
Glucose: 79 mg/dL (ref 70–99)
Potassium: 4.4 mmol/L (ref 3.5–5.2)
Sodium: 142 mmol/L (ref 134–144)
Total Protein: 6.1 g/dL (ref 6.0–8.5)
eGFR: 59 mL/min/{1.73_m2} — ABNORMAL LOW (ref >59)

## 2023-08-20 ENCOUNTER — Ambulatory Visit: Admitting: Physician Assistant

## 2023-08-20 ENCOUNTER — Encounter: Payer: Self-pay | Admitting: General Practice

## 2023-10-22 ENCOUNTER — Ambulatory Visit: Admitting: Emergency Medicine

## 2023-10-22 VITALS — Ht 66.0 in | Wt 147.0 lb

## 2023-10-22 DIAGNOSIS — Z0000 Encounter for general adult medical examination without abnormal findings: Secondary | ICD-10-CM | POA: Diagnosis not present

## 2023-10-22 NOTE — Progress Notes (Signed)
 Subjective:   Kaitlin [name] is a 89 y.o. who presents for a Medicare Wellness preventive visit.  As a reminder, Annual Wellness Visits don't include a physical exam, and some assessments may be limited, especially if this visit is performed virtually. We may recommend an in-person follow-up visit with your provider if needed.  Visit Complete: Virtual I connected with  Kaitlin [name] on 10/22/23 by a audio enabled telemedicine application and verified that I am speaking with the correct person using two identifiers.  Patient Location: Home  Provider Location: Home Office  I discussed the limitations of evaluation and management by telemedicine. The patient expressed understanding and agreed to proceed.  Vital Signs: Because this visit was a virtual/telehealth visit, some criteria may be missing or patient reported. Any vitals not documented were not able to be obtained and vitals that have been documented are patient reported.  VideoDeclined- This patient declined Librarian, academic. Therefore the visit was completed with audio only.  Persons Participating in Visit: Patient assisted by Benedict Brain, son.  AWV Questionnaire: No: Patient Medicare AWV questionnaire was not completed prior to this visit.  Cardiac Risk Factors include: advanced age (>68men, >68 women);dyslipidemia;hypertension     Objective:     Today's Vitals   10/22/23 1313  Weight: 147 lb (66.7 kg)  Height: 5\' 6"  (1.676 m)   Body mass index is 23.73 kg/m.     10/22/2023    1:35 PM 04/05/2023   12:07 PM 12/25/2022    8:40 AM 11/22/2022    5:00 PM 11/22/2022   10:24 AM 11/11/2022    6:30 PM 07/31/2022   11:08 AM  Advanced Directives  Does Patient Have a Medical Advance Directive? Yes No Yes Yes No Yes Yes  Type of Estate agent of [place];Living will  Healthcare Power of [place];Living will Living will;Healthcare Power of [place];Out of facility DNR (pink  MOST or yellow form)  Healthcare Power of [place];Living will Healthcare Power of [place];Living will  Does patient want to make changes to medical advance directive? No - Patient declined   No - Patient declined  No - Patient declined No - Patient declined  Copy of Healthcare Power of Attorney in Chart? Yes - validated most recent copy scanned in chart (See row information)     Yes - validated most recent copy scanned in chart (See row information) Yes - validated most recent copy scanned in chart (See row information)    Current Medications (verified) Outpatient Encounter Medications as of 10/22/2023  Medication Sig   allopurinol  (ZYLOPRIM ) 100 MG tablet Take 1 tablet (100 mg total) by mouth daily.   aspirin  81 MG tablet Take 81 mg by mouth daily.   cetirizine (ZYRTEC) 10 MG chewable tablet Chew 10 mg by mouth daily.   Cholecalciferol  (VITAMIN D -3) 25 MCG (1000 UT) CAPS Take 1 capsule by mouth daily.   cyanocobalamin (VITAMIN B12) 1000 MCG tablet Take 1,000 mcg by mouth daily.   docusate sodium  (COLACE) 100 MG capsule Take 100 mg by mouth 2 (two) times daily.   donepezil  (ARICEPT ) 10 MG tablet Take 1 tablet (10 mg total) by mouth at bedtime.   loratadine (CLARITIN) 10 MG tablet Take 10 mg by mouth daily.   memantine  (NAMENDA ) 10 MG tablet Take 1 tablet (10 mg total) by mouth 2 (two) times daily.   Misc Natural Products (OSTEO BI-FLEX ADV TRIPLE ST) TABS Take 1 tablet by mouth in the morning and at bedtime.  pravastatin  (PRAVACHOL ) 40 MG tablet Take 1 tablet (40 mg total) by mouth daily.   vitamin E 180 MG (400 UNITS) capsule Take 400 Units by mouth daily.   Biotin  1000 MCG tablet Take 1,000 mcg by mouth daily. (Patient not taking: Reported on 10/22/2023)   colchicine  0.6 MG tablet Take 2 caps po x 1, then 1 cap po 1 hr later x 1 (Patient not taking: Reported on 10/22/2023)   No facility-administered encounter medications on file as of 10/22/2023.    Allergies (verified) Amoxicillin-pot  clavulanate, Tetracycline, Tramadol  hcl, Tetracyclines & related, and Tylenol [acetaminophen]   History: Past Medical History:  Diagnosis Date   Alzheimer disease (HCC)    Cystitis    Diffuse cystic mastopathy    Gout    Hypertension    Osteopetrosis    Past Surgical History:  Procedure Laterality Date   BLADDER STONE REMOVAL  2008   BREAST EXCISIONAL BIOPSY Right    benign; many years ago   BREAST LUMPECTOMY Right 1974   positive-no radiation or chemo per pt   COLONOSCOPY  1994   LAPAROSCOPY  2013   gallstone removal   NASAL SINUS SURGERY     OOPHORECTOMY Bilateral    PARATHYROIDECTOMY N/A 08/12/2020   Procedure: PARATHYROIDECTOMY with RNFA to assist;  Surgeon: Mercy Stall, MD;  Location: [facility] ORS;  Service: General;  Laterality: N/A;   TONSILLECTOMY     TOTAL ABDOMINAL HYSTERECTOMY W/ BILATERAL SALPINGOOPHORECTOMY  1980   Family History  Problem Relation Age of Onset   Kidney Stones Brother    Dementia Mother    Aneurysm Mother    Other Father        unknown medical history   Breast cancer Neg Hx    Social History   Socioeconomic History   Marital status: Widowed    Spouse name: Not on file   Number of children: 2   Years of education: Not on file   Highest education level: Not on file  Occupational History   Occupation: retired  Tobacco Use   Smoking status: Former    Current packs/day: 0.00    Types: Cigarettes    Quit date: 08/14/1990    Years since quitting: 33.2    Passive exposure: Past   Smokeless tobacco: Never   Tobacco comments:    10/22/23 unknown start date of smoking/pbt  Vaping Use   Vaping status: Never Used  Substance and Sexual Activity   Alcohol use: No   Drug use: No   Sexual activity: Not Currently  Other Topics Concern   Not on file  Social History Narrative   Not on file   Social Drivers of Health   Financial Resource Strain: Low Risk  (10/22/2023)   Overall Financial Resource Strain (CARDIA)    Difficulty of Paying  Living Expenses: Not hard at all  Food Insecurity: No Food Insecurity (10/22/2023)   Hunger Vital Sign    Worried About Running Out of Food in the Last Year: Never true    Ran Out of Food in the Last Year: Never true  Transportation Needs: No Transportation Needs (10/22/2023)   PRAPARE - Administrator, Civil Service (Medical): No    Lack of Transportation (Non-Medical): No  Physical Activity: Inactive (10/22/2023)   Exercise Vital Sign    Days of Exercise per Week: 0 days    Minutes of Exercise per Session: 0 min  Stress: No Stress Concern Present (10/22/2023)   [employer] of Occupational Health -  Occupational Stress Questionnaire    Feeling of Stress : Not at all  Social Connections: Moderately Integrated (10/22/2023)   Social Connection and Isolation Panel [NHANES]    Frequency of Communication with Friends and Family: More than three times a week    Frequency of Social Gatherings with Friends and Family: Twice a week    Attends Religious Services: More than 4 times per year    Active Member of [employer] or Organizations: Yes    Attends Banker Meetings: More than 4 times per year    Marital Status: Widowed    Tobacco Counseling Counseling given: No Tobacco comments: 10/22/23 unknown start date of smoking/pbt    Clinical Intake:  Pre-visit preparation completed: Yes  Pain : No/denies pain     BMI - recorded: 23.73 Nutritional Status: BMI of 19-24  Normal Nutritional Risks: None Diabetes: No  Lab Results  Component Value Date   HGBA1C 5.7 09/01/2011     How often do you need to have someone help you when you read instructions, pamphlets, or other written materials from your doctor or pharmacy?: 1 - Never  Interpreter Needed?: No  Information entered by :: Jaunita Messier, CMA   Activities of Daily Living     10/22/2023    1:16 PM 11/22/2022    5:00 PM  In your present state of health, do you have any difficulty performing the following  activities:  Hearing? 0 0  Vision? 0 0  Difficulty concentrating or making decisions? 0 1  Walking or climbing stairs? 0 1  Dressing or bathing? 0 0  Doing errands, shopping? 1 0  Comment friends take to appointments   Preparing Food and eating ? N   Using the Toilet? N   In the past six months, have you accidently leaked urine? N   Do you have problems with loss of bowel control? N   Managing your Medications? N   Managing your Finances? Y   Comment son Cytogeneticist or managing your Housekeeping? N     Patient Care Team: Aileen Alexanders, NP as PCP - General (Nurse Practitioner) Paich, Kaitlin, PA-C (Neurology) Monica Ann, MD (Nephrology) Lorelei Rogers Nicolas Barren, MD as Physician Assistant (Endocrinology)  I have updated your Care Teams any recent Medical Services you may have received from other providers in the past year.     Assessment:    This is a routine wellness examination for [place].  Hearing/Vision screen Hearing Screening - Comments:: Denies hearing loss Vision Screening - Comments:: Needs routine eye exam, declined referral and list of doctors   Goals Addressed               This Visit's Progress     Increase physical activity (pt-stated)         Depression Screen     10/22/2023    1:32 PM 04/16/2023    9:51 AM 02/27/2023   10:53 AM 12/27/2022    2:11 PM 09/11/2022    1:28 PM 07/31/2022   11:06 AM 06/07/2022   11:06 AM  PHQ 2/9 Scores  PHQ - 2 Score 0 0 0 0 0 0 0  PHQ- 9 Score 1 0 0 0 0 0 0    Fall Risk     10/22/2023    1:35 PM 02/27/2023   10:53 AM 12/27/2022    2:11 PM 09/11/2022    1:27 PM 07/31/2022   11:08 AM  Fall Risk   Falls in the past  year? 0 0 1 0 0  Number falls in past yr: 0 0 1 0 0  Injury with Fall? 0 0 1 0 0  Risk for fall due to : No Fall Risks No Fall Risks History of fall(s) No Fall Risks No Fall Risks  Follow up Falls evaluation completed Falls evaluation completed Falls evaluation completed Falls  evaluation completed Falls prevention discussed;Falls evaluation completed    MEDICARE RISK AT HOME:  Medicare Risk at Home Any stairs in or around the home?: Yes If so, are there any without handrails?: No Home free of loose throw rugs in walkways, pet beds, electrical cords, etc?: Yes Adequate lighting in your home to reduce risk of falls?: Yes Life alert?: Yes (doesn't use) Use of a cane, walker or w/c?: No Grab bars in the bathroom?: Yes Shower chair or bench in shower?: Yes Elevated toilet seat or a handicapped toilet?: Yes  TIMED UP AND GO:  Was the test performed?  No  Cognitive Function: 6CIT completed        10/22/2023    1:37 PM 07/31/2022   11:12 AM 05/29/2021    9:07 AM  6CIT Screen  What Year? 4 points 0 points 0 points  What month? 0 points 0 points 3 points  What time? 3 points 0 points 0 points  Count back from 20 0 points 0 points 0 points  Months in reverse 0 points 4 points 0 points  Repeat phrase 10 points 8 points 6 points  Total Score 17 points 12 points 9 points    Immunizations Immunization History  Administered Date(s) Administered   Fluad Quad(high Dose 65+) 03/29/2021, 06/07/2022   Fluad Trivalent(High Dose 65+) 02/27/2023   Influenza, High Dose Seasonal PF 02/28/2018, 02/23/2019   Influenza-Unspecified 05/26/2008, 01/28/2012, 02/18/2013, 02/17/2014, 03/18/2015, 03/18/2017, 03/14/2018, 03/12/2019   PFIZER(Purple Top)SARS-COV-2 Vaccination 06/11/2019, 07/03/2019, 02/12/2020   Pfizer(Comirnaty)Fall Seasonal Vaccine 12 years and older 02/27/2023   Pneumococcal Conjugate-13 09/13/2014, 06/29/2021   Pneumococcal Polysaccharide-23 11/30/2015, 06/07/2022   Tdap 12/03/2016   Zoster, Live 01/28/2012    Screening Tests Health Maintenance  Topic Date Due   Zoster Vaccines- Shingrix (1 of 2) 07/07/1953   COVID-19 Vaccine (5 - Pfizer risk 2024-25 season) 08/28/2023   INFLUENZA VACCINE  12/13/2023   Medicare Annual Wellness (AWV)  10/21/2024    DTaP/Tdap/Td (2 - Td or Tdap) 12/04/2026   Pneumonia Vaccine 42+ Years old  Completed   DEXA SCAN  Completed   HPV VACCINES  Aged Out   Meningococcal B Vaccine  Aged Out    Health Maintenance  Health Maintenance Due  Topic Date Due   Zoster Vaccines- Shingrix (1 of 2) 07/07/1953   COVID-19 Vaccine (5 - Pfizer risk 2024-25 season) 08/28/2023   Health Maintenance Items Addressed: See Nurse Notes at the end of this note  Additional Screening:  Vision Screening: Recommended annual ophthalmology exams for early detection of glaucoma and other disorders of the eye. Would you like a referral to an eye doctor? No    Dental Screening: Recommended annual dental exams for proper oral hygiene  Community Resource Referral / Chronic Care Management: CRR required this visit?  No   CCM required this visit?  No   Plan:    I have personally reviewed and noted the following in the patient's chart:   Medical and social history Use of alcohol, tobacco or illicit drugs  Current medications and supplements including opioid prescriptions. Patient is not currently taking opioid prescriptions. Functional ability and status Nutritional  status Physical activity Advanced directives List of other physicians Hospitalizations, surgeries, and ER visits in previous 12 months Vitals Screenings to include cognitive, depression, and falls Referrals and appointments  In addition, I have reviewed and discussed with patient certain preventive protocols, quality metrics, and best practice recommendations. A written personalized care plan for preventive services as well as general preventive health recommendations were provided to patient.   Jaunita Messier, CMA   10/22/2023   After Visit Summary: (MyChart) Due to this being a telephonic visit, the after visit summary with patients personalized plan was offered to patient via MyChart   Notes:  Emmalene Kattner, son, assisted with today's visit 6 CIT Score  17 Covid and flu vaccines in the fall Discuss Shingrix vaccine at next OV (10/30/23)

## 2023-10-22 NOTE — Patient Instructions (Signed)
 Kaitlin [name] , Thank you for taking time out of your busy schedule to complete your Annual Wellness Visit with me. I enjoyed our conversation and look forward to speaking with you again next year. I, as well as your care team,  appreciate your ongoing commitment to your health goals. Please review the following plan we discussed and let me know if I can assist you in the future. Your Game plan/ To Do List    Referrals: None  Follow up Visits: Next Medicare AWV with our clinical staff: 10/27/24 @ 11:20am (PHONE VISIT)   Have you seen your provider in the last 6 months (3 months if uncontrolled diabetes)? Yes Next Office Visit with your provider: 10/30/23 @ 1:20pm with Aileen Alexanders, NP  Clinician Recommendations: Discuss with Aileen Alexanders, NP if you should get the Shingrix (shingles vaccines) at your next OV. Aim for 30 minutes of exercise or brisk walking, 6-8 glasses of water, and 5 servings of fruits and vegetables each day.       This is a list of the screening recommended for you and due dates:  Health Maintenance  Topic Date Due   Zoster (Shingles) Vaccine (1 of 2) 07/07/1953   COVID-19 Vaccine (5 - Pfizer risk 2024-25 season) 08/28/2023   Flu Shot  12/13/2023   Medicare Annual Wellness Visit  10/21/2024   DTaP/Tdap/Td vaccine (2 - Td or Tdap) 12/04/2026   Pneumonia Vaccine  Completed   DEXA scan (bone density measurement)  Completed   HPV Vaccine  Aged Out   Meningitis B Vaccine  Aged Out    Advanced directives: (In Chart) A copy of your advanced directives are scanned into your chart should your provider ever need it. Advance Care Planning is important because it:  [x]  Makes sure you receive the medical care that is consistent with your values, goals, and preferences  [x]  It provides guidance to your family and loved ones and reduces their decisional burden about whether or not they are making the right decisions based on your wishes.  Follow the link provided in your  after visit summary or read over the paperwork we have mailed to you to help you started getting your Advance Directives in place. If you need assistance in completing these, please reach out to us  so that we can help you!  See attachments for Preventive Care and Fall Prevention Tips.   Fall Prevention in the Home, Adult Falls can cause injuries and affect people of all ages. There are many simple things that you can do to make your home safe and to help prevent falls. If you need it, ask for help making these changes. What actions can I take to prevent falls? General information Use good lighting in all rooms. Make sure to: Replace any light bulbs that burn out. Turn on lights if it is dark and use night-lights. Keep items that you use often in easy-to-reach places. Lower the shelves around your home if needed. Move furniture so that there are clear paths around it. Do not keep throw rugs or other things on the floor that can make you trip. If any of your floors are uneven, fix them. Add color or contrast paint or tape to clearly mark and help you see: Grab bars or handrails. First and last steps of staircases. Where the edge of each step is. If you use a ladder or stepladder: Make sure that it is fully opened. Do not climb a closed ladder. Make sure the sides of the  ladder are locked in place. Have someone hold the ladder while you use it. Know where your pets are as you move through your home. What can I do in the bathroom?     Keep the floor dry. Clean up any water that is on the floor right away. Remove soap buildup in the bathtub or shower. Buildup makes bathtubs and showers slippery. Use non-skid mats or decals on the floor of the bathtub or shower. Attach bath mats securely with double-sided, non-slip rug tape. If you need to sit down while you are in the shower, use a non-slip stool. Install grab bars by the toilet and in the bathtub and shower. Do not use towel bars as  grab bars. What can I do in the bedroom? Make sure that you have a light by your bed that is easy to reach. Do not use any sheets or blankets on your bed that hang to the floor. Have a firm bench or chair with side arms that you can use for support when you get dressed. What can I do in the kitchen? Clean up any spills right away. If you need to reach something above you, use a sturdy step stool that has a grab bar. Keep electrical cables out of the way. Do not use floor polish or wax that makes floors slippery. What can I do with my stairs? Do not leave anything on the stairs. Make sure that you have a light switch at the top and the bottom of the stairs. Have them installed if you do not have them. Make sure that there are handrails on both sides of the stairs. Fix handrails that are broken or loose. Make sure that handrails are as long as the staircases. Install non-slip stair treads on all stairs in your home if they do not have carpet. Avoid having throw rugs at the top or bottom of stairs, or secure the rugs with carpet tape to prevent them from moving. Choose a carpet design that does not hide the edge of steps on the stairs. Make sure that carpet is firmly attached to the stairs. Fix any carpet that is loose or worn. What can I do on the outside of my home? Use bright outdoor lighting. Repair the edges of walkways and driveways and fix any cracks. Clear paths of anything that can make you trip, such as tools or rocks. Add color or contrast paint or tape to clearly mark and help you see high doorway thresholds. Trim any bushes or trees on the main path into your home. Check that handrails are securely fastened and in good repair. Both sides of all steps should have handrails. Install guardrails along the edges of any raised decks or porches. Have leaves, snow, and ice cleared regularly. Use sand, salt, or ice melt on walkways during winter months if you live where there is ice and  snow. In the garage, clean up any spills right away, including grease or oil spills. What other actions can I take? Review your medicines with your health care provider. Some medicines can make you confused or feel dizzy. This can increase your chance of falling. Wear closed-toe shoes that fit well and support your feet. Wear shoes that have rubber soles and low heels. Use a cane, walker, scooter, or crutches that help you move around if needed. Talk with your provider about other ways that you can decrease your risk of falls. This may include seeing a physical therapist to learn to do exercises to  improve movement and strength. Where to find more information Centers for Disease Control and Prevention, STEADI: TonerPromos.no [employer] on Aging: BaseRingTones.pl National Institute on Aging: BaseRingTones.pl Contact a health care provider if: You are afraid of falling at home. You feel weak, drowsy, or dizzy at home. You fall at home. Get help right away if you: Lose consciousness or have trouble moving after a fall. Have a fall that causes a head injury. These symptoms may be an emergency. Get help right away. Call 911. Do not wait to see if the symptoms will go away. Do not drive yourself to the hospital. This information is not intended to replace advice given to you by your health care provider. Make sure you discuss any questions you have with your health care provider. Document Revised: 01/01/2022 Document Reviewed: 01/01/2022 Elsevier Patient Education  2024 [employer].

## 2023-10-30 ENCOUNTER — Encounter: Payer: Self-pay | Admitting: Nurse Practitioner

## 2023-10-30 ENCOUNTER — Ambulatory Visit (INDEPENDENT_AMBULATORY_CARE_PROVIDER_SITE_OTHER): Admitting: Nurse Practitioner

## 2023-10-30 VITALS — BP 136/86 | HR 68 | Temp 98.7°F | Ht 66.0 in | Wt 154.0 lb

## 2023-10-30 DIAGNOSIS — G309 Alzheimer's disease, unspecified: Secondary | ICD-10-CM | POA: Diagnosis not present

## 2023-10-30 DIAGNOSIS — F0280 Dementia in other diseases classified elsewhere without behavioral disturbance: Secondary | ICD-10-CM | POA: Diagnosis not present

## 2023-10-30 DIAGNOSIS — I10 Essential (primary) hypertension: Secondary | ICD-10-CM | POA: Diagnosis not present

## 2023-10-30 DIAGNOSIS — E210 Primary hyperparathyroidism: Secondary | ICD-10-CM

## 2023-10-30 NOTE — Assessment & Plan Note (Signed)
 Chronic.  Ongoing concern.  Has a great support symptom around her with friends who look in on her closely.  Receives meals on wheels, does memory games and tries to remain active.  She has been out of her Donepezil .   Long term prescriptions were sent to [facility].  Followed by Neurology-reviewed recent note. Follow up in 3 months.  Call sooner if concerns arise.

## 2023-10-30 NOTE — Assessment & Plan Note (Signed)
 Chronic.  Controlled.  Not currently on any medications. Important to keep blood pressure well controlled.  Return to clinic in 3 months for reevaluation.  Labs ordered today.  Call sooner if concerns arise.

## 2023-10-30 NOTE — Assessment & Plan Note (Signed)
 Followed by Endo. Continue to follow up with specialist.

## 2023-10-30 NOTE — Progress Notes (Signed)
 BP 136/86   Pulse 68   Temp 98.7 F (37.1 C)   Ht 5' 6 (1.676 m)   Wt 154 lb (69.9 kg)   SpO2 92%   BMI 24.86 kg/m    Subjective:    Patient ID: Kaitlin [name], female    DOB: Jan 29, 1935, 89 y.o.   MRN: [id number]  HPI: Kaitlin [name] is a 89 y.o. female  Chief Complaint  Patient presents with   Medical Management of Chronic Issues   HYPERTENSION without Chronic Kidney Disease Hypertension status: controlled  Satisfied with current treatment? yes Duration of hypertension: years BP monitoring frequency:  not checking BP range:  BP medication side effects:  no Medication compliance: excellent compliance Previous BP meds:none Aspirin : no Recurrent headaches: no Visual changes: no Palpitations: no Dyspnea: no Chest pain: no Lower extremity edema: no Dizzy/lightheaded: no  MEMORY CONCERNS Patient is doing well.  She has two close friends, Thersia Flax and Gregary Lean who look out for her.  Her Son, Bambi Lever is in [place] .  She also has a Location manager in Social worker, Marlou Sims, in the area.  She is getting meals on wheels also.  She feels like her memory fluctuates.  She goes out with the senior citizen group and goes out with church friends.  Taking medications as scheduled.   Patient is followed by Urology, Nephrology and Neurology.  Seeing the specialists annually.      Relevant past medical, surgical, family and social history reviewed and updated as indicated. Interim medical history since our last visit reviewed. Allergies and medications reviewed and updated.  Review of Systems  Eyes:  Negative for visual disturbance.  Respiratory:  Negative for cough, chest tightness and shortness of breath.   Cardiovascular:  Negative for chest pain, palpitations and leg swelling.  Neurological:  Negative for dizziness and headaches.    Per HPI unless specifically indicated above     Objective:    BP 136/86   Pulse 68   Temp 98.7 F (37.1 C)   Ht 5' 6 (1.676 m)   Wt 154 lb (69.9 kg)    SpO2 92%   BMI 24.86 kg/m   Wt Readings from Last 3 Encounters:  10/30/23 154 lb (69.9 kg)  10/22/23 147 lb (66.7 kg)  07/23/23 147 lb 9.6 oz (67 kg)    Physical Exam Vitals and nursing note reviewed.  Constitutional:      General: She is not in acute distress.    Appearance: Normal appearance. She is normal weight. She is not ill-appearing, toxic-appearing or diaphoretic.  HENT:     Head: Normocephalic.     Right Ear: External ear normal.     Left Ear: External ear normal.     Nose: Nose normal.     Mouth/Throat:     Mouth: Mucous membranes are moist.     Pharynx: Oropharynx is clear.   Eyes:     General:        Right eye: No discharge.        Left eye: No discharge.     Extraocular Movements: Extraocular movements intact.     Conjunctiva/sclera: Conjunctivae normal.     Pupils: Pupils are equal, round, and reactive to light.    Cardiovascular:     Rate and Rhythm: Normal rate and regular rhythm.     Heart sounds: No murmur heard. Pulmonary:     Effort: Pulmonary effort is normal. No respiratory distress.     Breath sounds: Normal breath sounds. No wheezing or  rales.   Musculoskeletal:     Cervical back: Normal range of motion and neck supple.   Skin:    General: Skin is warm and dry.     Capillary Refill: Capillary refill takes less than 2 seconds.   Neurological:     General: No focal deficit present.     Mental Status: She is alert and oriented to person, place, and time. Mental status is at baseline.   Psychiatric:        Mood and Affect: Mood normal.        Behavior: Behavior normal.        Thought Content: Thought content normal.        Judgment: Judgment normal.     Results for orders placed or performed in visit on 07/23/23  Comprehensive metabolic panel   Collection Time: 07/23/23  2:08 PM  Result Value Ref Range   Glucose 79 70 - 99 mg/dL   BUN 33 (H) 8 - 27 mg/dL   Creatinine, Ser 3.08 0.57 - 1.00 mg/dL   eGFR 59 (L) >65 HQ/ION/6.29    BUN/Creatinine Ratio 35 (H) 12 - 28   Sodium 142 134 - 144 mmol/L   Potassium 4.4 3.5 - 5.2 mmol/L   Chloride 106 96 - 106 mmol/L   CO2 20 20 - 29 mmol/L   Calcium  9.4 8.7 - 10.3 mg/dL   Total Protein 6.1 6.0 - 8.5 g/dL   Albumin 4.0 3.7 - 4.7 g/dL   Globulin, Total 2.1 1.5 - 4.5 g/dL   Bilirubin Total 0.3 0.0 - 1.2 mg/dL   Alkaline Phosphatase 64 44 - 121 IU/L   AST 14 0 - 40 IU/L   ALT 7 0 - 32 IU/L  CBC w/Diff   Collection Time: 07/23/23  2:08 PM  Result Value Ref Range   WBC 6.5 3.4 - 10.8 x10E3/uL   RBC 4.55 3.77 - 5.28 x10E6/uL   Hemoglobin 13.2 11.1 - 15.9 g/dL   Hematocrit 52.8 41.3 - 46.6 %   MCV 87 79 - 97 fL   MCH 29.0 26.6 - 33.0 pg   MCHC 33.2 31.5 - 35.7 g/dL   RDW 24.4 01.0 - 27.2 %   Platelets 297 150 - 450 x10E3/uL   Neutrophils 70 Not Estab. %   Lymphs 20 Not Estab. %   Monocytes 8 Not Estab. %   Eos 1 Not Estab. %   Basos 1 Not Estab. %   Neutrophils Absolute 4.6 1.4 - 7.0 x10E3/uL   Lymphocytes Absolute 1.3 0.7 - 3.1 x10E3/uL   Monocytes Absolute 0.5 0.1 - 0.9 x10E3/uL   EOS (ABSOLUTE) 0.1 0.0 - 0.4 x10E3/uL   Basophils Absolute 0.1 0.0 - 0.2 x10E3/uL   Immature Granulocytes 0 Not Estab. %   Immature Grans (Abs) 0.0 0.0 - 0.1 x10E3/uL      Assessment & Plan:   Problem List Items Addressed This Visit       Cardiovascular and Mediastinum   Essential (primary) hypertension   Chronic.  Controlled.  Not currently on any medications. Important to keep blood pressure well controlled.  Return to clinic in 3 months for reevaluation.  Labs ordered today.  Call sooner if concerns arise.         Endocrine   Hyperparathyroidism, primary (HCC)   Followed by Endo. Continue to follow up with specialist.        Nervous and Auditory   Alzheimer disease (HCC) - Primary   Chronic.  Ongoing concern.  Has a  great support symptom around her with friends who look in on her closely.  Receives meals on wheels, does memory games and tries to remain active.  She has  been out of her Donepezil .   Long term prescriptions were sent to [facility].  Followed by Neurology-reviewed recent note. Follow up in 3 months.  Call sooner if concerns arise.          Follow up plan: Return in about 3 months (around 01/30/2024) for HTN, HLD, DM2 FU.  A total of 30 minutes were spent on this encounter today.  When total time is documented, this includes both the face-to-face and non-face-to-face time personally spent before, during and after the visit on the date of the encounter reviewing medications, where medications are filled, specialists and follow ups.

## 2024-01-30 ENCOUNTER — Encounter: Payer: Self-pay | Admitting: Nurse Practitioner

## 2024-01-30 ENCOUNTER — Ambulatory Visit (INDEPENDENT_AMBULATORY_CARE_PROVIDER_SITE_OTHER): Admitting: Nurse Practitioner

## 2024-01-30 VITALS — BP 113/77 | HR 62 | Temp 98.9°F | Ht 66.0 in | Wt 151.8 lb

## 2024-01-30 DIAGNOSIS — I10 Essential (primary) hypertension: Secondary | ICD-10-CM | POA: Diagnosis not present

## 2024-01-30 DIAGNOSIS — F0280 Dementia in other diseases classified elsewhere without behavioral disturbance: Secondary | ICD-10-CM

## 2024-01-30 DIAGNOSIS — G309 Alzheimer's disease, unspecified: Secondary | ICD-10-CM | POA: Diagnosis not present

## 2024-01-30 DIAGNOSIS — N1832 Chronic kidney disease, stage 3b: Secondary | ICD-10-CM | POA: Diagnosis not present

## 2024-01-30 DIAGNOSIS — Z23 Encounter for immunization: Secondary | ICD-10-CM | POA: Diagnosis not present

## 2024-01-30 DIAGNOSIS — E210 Primary hyperparathyroidism: Secondary | ICD-10-CM | POA: Diagnosis not present

## 2024-01-30 DIAGNOSIS — E785 Hyperlipidemia, unspecified: Secondary | ICD-10-CM

## 2024-01-30 MED ORDER — PRAVASTATIN SODIUM 40 MG PO TABS: 40.0000 mg | ORAL_TABLET | Freq: Every day | ORAL | 1 refills | Status: DC

## 2024-01-30 MED ORDER — DONEPEZIL HCL 10 MG PO TABS: 10.0000 mg | ORAL_TABLET | Freq: Every day | ORAL | 1 refills | Status: DC

## 2024-01-30 MED ORDER — ALLOPURINOL 100 MG PO TABS: 100.0000 mg | ORAL_TABLET | Freq: Every day | ORAL | 1 refills | Status: DC

## 2024-01-30 MED ORDER — MEMANTINE HCL 10 MG PO TABS: 10.0000 mg | ORAL_TABLET | Freq: Two times a day (BID) | ORAL | 1 refills | Status: DC

## 2024-01-30 NOTE — Assessment & Plan Note (Signed)
 Chronic. Followed by Nephrology- annually.  Reviewed recent note.  Labs ordered at visit today.  Follow up in 3 months.  Call sooner if concerns arise.

## 2024-01-30 NOTE — Progress Notes (Signed)
 BP 113/77   Pulse 62   Temp 98.9 F (37.2 C) (Oral)   Ht 5' 6 (1.676 m)   Wt 151 lb 12.8 oz (68.9 kg)   SpO2 96%   BMI 24.50 kg/m    Subjective:    Patient ID: Kaitlin [name], female    DOB: 13-Aug-1934, 89 y.o.   MRN: [id number]  HPI: Kaitlin [name] is a 89 y.o. female  Chief Complaint  Patient presents with   Alzheimer Disease   Hyperlipidemia   Hypertension   HYPERTENSION without Chronic Kidney Disease Hypertension status: controlled  Satisfied with current treatment? yes Duration of hypertension: years BP monitoring frequency:  not checking BP range:  BP medication side effects:  no Medication compliance: excellent compliance Previous BP meds:none Aspirin : no Recurrent headaches: no Visual changes: no Palpitations: no Dyspnea: no Chest pain: no Lower extremity edema: no Dizzy/lightheaded: no  MEMORY CONCERNS Patient is doing well.  She has two close friends, Nathanel and Alisa who look out for her.  Her Son, Ozell is in [place] .  She also has a Location manager in Social worker, Ryan, in the area.  She is getting meals on wheels also.  She feels like her memory fluctuates.  She goes out with the senior citizen group and goes out with church friends.  Taking medications as scheduled.   Patient is followed by Urology, Nephrology and Neurology.  Seeing the specialists annually.      Relevant past medical, surgical, family and social history reviewed and updated as indicated. Interim medical history since our last visit reviewed. Allergies and medications reviewed and updated.  Review of Systems  Constitutional:  Positive for unexpected weight change.  Eyes:  Negative for visual disturbance.  Respiratory:  Negative for cough, chest tightness and shortness of breath.   Cardiovascular:  Negative for chest pain, palpitations and leg swelling.  Neurological:  Negative for dizziness and headaches.       Memory deficit    Per HPI unless specifically indicated above      Objective:    BP 113/77   Pulse 62   Temp 98.9 F (37.2 C) (Oral)   Ht 5' 6 (1.676 m)   Wt 151 lb 12.8 oz (68.9 kg)   SpO2 96%   BMI 24.50 kg/m   Wt Readings from Last 3 Encounters:  01/30/24 151 lb 12.8 oz (68.9 kg)  10/30/23 154 lb (69.9 kg)  10/22/23 147 lb (66.7 kg)    Physical Exam Vitals and nursing note reviewed.  Constitutional:      General: She is not in acute distress.    Appearance: Normal appearance. She is normal weight. She is not ill-appearing, toxic-appearing or diaphoretic.  HENT:     Head: Normocephalic.     Right Ear: External ear normal.     Left Ear: External ear normal.     Nose: Nose normal.     Mouth/Throat:     Mouth: Mucous membranes are moist.     Pharynx: Oropharynx is clear.  Eyes:     General:        Right eye: No discharge.        Left eye: No discharge.     Extraocular Movements: Extraocular movements intact.     Conjunctiva/sclera: Conjunctivae normal.     Pupils: Pupils are equal, round, and reactive to light.  Cardiovascular:     Rate and Rhythm: Normal rate and regular rhythm.     Heart sounds: No murmur heard. Pulmonary:  Effort: Pulmonary effort is normal. No respiratory distress.     Breath sounds: Normal breath sounds. No wheezing or rales.  Musculoskeletal:     Cervical back: Normal range of motion and neck supple.  Skin:    General: Skin is warm and dry.     Capillary Refill: Capillary refill takes less than 2 seconds.  Neurological:     General: No focal deficit present.     Mental Status: She is alert and oriented to person, place, and time. Mental status is at baseline.  Psychiatric:        Mood and Affect: Mood normal.        Behavior: Behavior normal.        Thought Content: Thought content normal.        Judgment: Judgment normal.     Results for orders placed or performed in visit on 07/23/23  Comprehensive metabolic panel   Collection Time: 07/23/23  2:08 PM  Result Value Ref Range   Glucose 79 70 -  99 mg/dL   BUN 33 (H) 8 - 27 mg/dL   Creatinine, Ser 9.05 0.57 - 1.00 mg/dL   eGFR 59 (L) >40 fO/fpw/8.26   BUN/Creatinine Ratio 35 (H) 12 - 28   Sodium 142 134 - 144 mmol/L   Potassium 4.4 3.5 - 5.2 mmol/L   Chloride 106 96 - 106 mmol/L   CO2 20 20 - 29 mmol/L   Calcium  9.4 8.7 - 10.3 mg/dL   Total Protein 6.1 6.0 - 8.5 g/dL   Albumin 4.0 3.7 - 4.7 g/dL   Globulin, Total 2.1 1.5 - 4.5 g/dL   Bilirubin Total 0.3 0.0 - 1.2 mg/dL   Alkaline Phosphatase 64 44 - 121 IU/L   AST 14 0 - 40 IU/L   ALT 7 0 - 32 IU/L  CBC w/Diff   Collection Time: 07/23/23  2:08 PM  Result Value Ref Range   WBC 6.5 3.4 - 10.8 x10E3/uL   RBC 4.55 3.77 - 5.28 x10E6/uL   Hemoglobin 13.2 11.1 - 15.9 g/dL   Hematocrit 60.2 65.9 - 46.6 %   MCV 87 79 - 97 fL   MCH 29.0 26.6 - 33.0 pg   MCHC 33.2 31.5 - 35.7 g/dL   RDW 86.7 88.2 - 84.5 %   Platelets 297 150 - 450 x10E3/uL   Neutrophils 70 Not Estab. %   Lymphs 20 Not Estab. %   Monocytes 8 Not Estab. %   Eos 1 Not Estab. %   Basos 1 Not Estab. %   Neutrophils Absolute 4.6 1.4 - 7.0 x10E3/uL   Lymphocytes Absolute 1.3 0.7 - 3.1 x10E3/uL   Monocytes Absolute 0.5 0.1 - 0.9 x10E3/uL   EOS (ABSOLUTE) 0.1 0.0 - 0.4 x10E3/uL   Basophils Absolute 0.1 0.0 - 0.2 x10E3/uL   Immature Granulocytes 0 Not Estab. %   Immature Grans (Abs) 0.0 0.0 - 0.1 x10E3/uL      Assessment & Plan:   Problem List Items Addressed This Visit       Cardiovascular and Mediastinum   Essential (primary) hypertension   Chronic.  Controlled.  Not currently on any medications. Important to keep blood pressure well controlled.  Return to clinic in 3 months for reevaluation.  Labs ordered today.  Call sooner if concerns arise.       Relevant Medications   pravastatin  (PRAVACHOL ) 40 MG tablet     Endocrine   Hyperparathyroidism, primary (HCC)   Followed by Endo. Continue to follow up with  specialist.  Reviewed recent note. Labs ordered today.       Relevant Orders   PTH, Intact and  Calcium      Nervous and Auditory   Alzheimer disease (HCC)   Chronic.  Ongoing concern.  Has a great support symptom around her with friends who look in on her closely.  Receives meals on wheels, does memory games and tries to remain active.  Son will be here to visit next week.  Refills of medications sent today. Long term prescriptions were sent to [facility].  Followed by Neurology-reviewed recent note. Follow up in 3 months.  Call sooner if concerns arise.   Discussed patient's weight during visit today.  She did lose 3lbs since last visit.  Encouraged her to not lose anymore.       Relevant Medications   donepezil  (ARICEPT ) 10 MG tablet   memantine  (NAMENDA ) 10 MG tablet     Genitourinary   Stage 3b chronic kidney disease (HCC) - Primary   Chronic. Followed by Nephrology- annually.  Reviewed recent note.  Labs ordered at visit today.  Follow up in 3 months.  Call sooner if concerns arise.        Relevant Orders   Comp Met (CMET)     Other   Hyperlipidemia   Chronic.  Controlled.  Continue with current medication regimen of Pravastatin  daily.  Labs ordered today.  Return to clinic in 3 months for reevaluation.  Call sooner if concerns arise.       Relevant Medications   pravastatin  (PRAVACHOL ) 40 MG tablet   Other Relevant Orders   Lipid Profile   Other Visit Diagnoses       Need for influenza vaccination       Relevant Orders   Flu vaccine HIGH DOSE PF(Fluzone Trivalent) (Completed)         Follow up plan: Return in about 3 months (around 04/30/2024) for HTN, HLD, DM2 FU.  A total of 30 minutes were spent on this encounter today.  When total time is documented, this includes both the face-to-face and non-face-to-face time personally spent before, during and after the visit on the date of the encounter reviewing medications, where medications are filled, specialists and follow ups.

## 2024-01-30 NOTE — Assessment & Plan Note (Signed)
 Chronic.  Controlled.  Not currently on any medications. Important to keep blood pressure well controlled.  Return to clinic in 3 months for reevaluation.  Labs ordered today.  Call sooner if concerns arise.

## 2024-01-30 NOTE — Assessment & Plan Note (Signed)
Chronic.  Controlled.  Continue with current medication regimen of Pravastatin daily.  Labs ordered today.  Return to clinic in 3 months for reevaluation.  Call sooner if concerns arise.

## 2024-01-30 NOTE — Assessment & Plan Note (Signed)
 Followed by Endo. Continue to follow up with specialist.  Reviewed recent note. Labs ordered today.

## 2024-01-30 NOTE — Assessment & Plan Note (Addendum)
 Chronic.  Ongoing concern.  Has a great support symptom around her with friends who look in on her closely.  Receives meals on wheels, does memory games and tries to remain active.  Son will be here to visit next week.  Refills of medications sent today. Long term prescriptions were sent to [facility].  Followed by Neurology-reviewed recent note. Follow up in 3 months.  Call sooner if concerns arise.   Discussed patient's weight during visit today.  She did lose 3lbs since last visit.  Encouraged her to not lose anymore.

## 2024-01-31 ENCOUNTER — Ambulatory Visit: Payer: Self-pay | Admitting: Nurse Practitioner

## 2024-02-01 LAB — COMPREHENSIVE METABOLIC PANEL WITH GFR
ALT: 8 IU/L (ref 0–32)
AST: 15 IU/L (ref 0–40)
Albumin: 4.2 g/dL (ref 3.7–4.7)
Alkaline Phosphatase: 77 IU/L (ref 48–129)
BUN/Creatinine Ratio: 35 — ABNORMAL HIGH (ref 12–28)
BUN: 35 mg/dL — ABNORMAL HIGH (ref 8–27)
Bilirubin Total: 0.3 mg/dL (ref 0.0–1.2)
CO2: 26 mmol/L (ref 20–29)
Calcium: 10.6 mg/dL — ABNORMAL HIGH (ref 8.7–10.3)
Chloride: 100 mmol/L (ref 96–106)
Creatinine, Ser: 1 mg/dL (ref 0.57–1.00)
Globulin, Total: 2.2 g/dL (ref 1.5–4.5)
Glucose: 90 mg/dL (ref 70–99)
Potassium: 5 mmol/L (ref 3.5–5.2)
Sodium: 141 mmol/L (ref 134–144)
Total Protein: 6.4 g/dL (ref 6.0–8.5)
eGFR: 55 mL/min/1.73 — ABNORMAL LOW (ref >59)

## 2024-02-01 LAB — LIPID PANEL
Chol/HDL Ratio: 2.8 ratio (ref 0.0–4.4)
Cholesterol, Total: 157 mg/dL (ref 100–199)
HDL: 56 mg/dL (ref >39)
LDL Chol Calc (NIH): 87 mg/dL (ref 0–99)
Triglycerides: 75 mg/dL (ref 0–149)
VLDL Cholesterol Cal: 14 mg/dL (ref 5–40)

## 2024-02-01 LAB — PTH, INTACT AND CALCIUM: PTH: 11 pg/mL — ABNORMAL LOW (ref 15–65)

## 2024-04-18 NOTE — Progress Notes (Unsigned)
 04/21/24 10:21 AM   Kaitlin [name] [birth year] [id number]  Referring provider:  Melvin Pao, NP [address] [place],  [place] [address]  Urological history  1. High risk hematuria - Former smoker - CTU (11/2019) Small bilateral nonobstructive renal calculi. There is prominence of the left renal pelvis and the ureters are both mildly patulous to the ureterovesicular junction without overt hydronephrosis. No evidence of obstructing lesion or filling defect on delayed phase imaging - Cysto (11/2019) NED   2. rUTI's - contributing factors of age, Alzheimer's, vaginal atrophy, incontinence and constipation - documented urine cultures over the last year  - none     3. Nephrolithiasis - CTU (11/2019) small bilateral nonobstructive stones   4. Vaginal atrophy - not bothersome    5. Urgency - managing conservatively   HPI: Kaitlin [name] is a 89 y.o. woman who returns today for yearly follow up her [name], Kaitlin [name].    Previous records reviewed.   She has had 1-7 daytime voids, no urge to urinate.  She is leaking 1-2 times a day.  She wears 1 depends daily.  She does not limit fluid intake.  She sometimes engages in toilet mapping.  UA yellow slightly cloudy, specific gravity 1.010, pH 6.0, 2+ leukocyte, greater than 30 WBCs, 0-2 RBCs, greater than 10 epithelial cells and many bacteria.  PVR 117 mL  Serum creatinine (01/2024) 1.00  PMH: Past Medical History:  Diagnosis Date   Alzheimer disease (HCC)    Cystitis    Diffuse cystic mastopathy    Gout    Hypertension    Osteopetrosis     Surgical History: Past Surgical History:  Procedure Laterality Date   BLADDER STONE REMOVAL  2008   BREAST EXCISIONAL BIOPSY Right    benign; many years ago   BREAST LUMPECTOMY Right 1974   positive-no radiation or chemo per pt   COLONOSCOPY  1994   LAPAROSCOPY  2013   gallstone removal   NASAL SINUS SURGERY     OOPHORECTOMY Bilateral    PARATHYROIDECTOMY N/A 08/12/2020    Procedure: PARATHYROIDECTOMY with RNFA to assist;  Surgeon: Marolyn Nest, MD;  Location: [facility] ORS;  Service: General;  Laterality: N/A;   TONSILLECTOMY     TOTAL ABDOMINAL HYSTERECTOMY W/ BILATERAL SALPINGOOPHORECTOMY  1980    Home Medications:  Allergies as of 04/21/2024       Reactions   Amoxicillin-pot Clavulanate Nausea And Vomiting, Rash   Tetracycline    Tramadol  Hcl    Other Reaction(s): loopiness, fell out of bed likely related to medication use   Tetracyclines & Related Rash   Tylenol [acetaminophen] Rash        Medication List        Accurate as of April 21, 2024 10:21 AM. If you have any questions, ask your nurse or doctor.          allopurinol  100 MG tablet Commonly known as: ZYLOPRIM  Take 1 tablet (100 mg total) by mouth daily.   aspirin  81 MG tablet Take 81 mg by mouth daily.   Biotin  1000 MCG tablet Take 1,000 mcg by mouth daily.   cetirizine 10 MG chewable tablet Commonly known as: ZYRTEC Chew 10 mg by mouth daily.   colchicine  0.6 MG tablet Take 2 caps po x 1, then 1 cap po 1 hr later x 1   cyanocobalamin 1000 MCG tablet Commonly known as: VITAMIN B12 Take 1,000 mcg by mouth daily.   docusate sodium  100 MG capsule Commonly known as:  COLACE Take 100 mg by mouth 2 (two) times daily.   donepezil  10 MG tablet Commonly known as: ARICEPT  Take 1 tablet (10 mg total) by mouth at bedtime.   loratadine 10 MG tablet Commonly known as: CLARITIN Take 10 mg by mouth daily.   memantine  10 MG tablet Commonly known as: NAMENDA  Take 1 tablet (10 mg total) by mouth 2 (two) times daily.   Osteo Bi-Flex Adv Triple St Tabs Take 1 tablet by mouth in the morning and at bedtime.   pravastatin  40 MG tablet Commonly known as: PRAVACHOL  Take 1 tablet (40 mg total) by mouth daily.   Vitamin D -3 25 MCG (1000 UT) Caps Take 1 capsule by mouth daily.   vitamin E 180 MG (400 UNITS) capsule Take 400 Units by mouth daily.        Allergies:   Allergies  Allergen Reactions   Amoxicillin-Pot Clavulanate Nausea And Vomiting and Rash   Tetracycline    Tramadol  Hcl     Other Reaction(s): loopiness, fell out of bed likely related to medication use   Tetracyclines & Related Rash   Tylenol [Acetaminophen] Rash    Family History: Family History  Problem Relation Age of Onset   Kidney Stones Brother    Dementia Mother    Aneurysm Mother    Other Father        unknown medical history   Breast cancer Neg Hx     Social History:  reports that she quit smoking about 33 years ago. Her smoking use included cigarettes. She has been exposed to tobacco smoke. She has never used smokeless tobacco. She reports that she does not drink alcohol and does not use drugs.   Physical Exam: BP 128/68 (BP Location: Left Arm, Patient Position: Sitting, Cuff Size: Large)   Pulse 70   Wt 150 lb (68 kg)   SpO2 96%   BMI 24.21 kg/m   Constitutional:  Well nourished. Alert and oriented, No acute distress. HEENT: [place] AT, moist mucus membranes.  Trachea midline Cardiovascular: No clubbing, cyanosis, or edema. Respiratory: Normal respiratory effort, no increased work of breathing. Neurologic: Grossly intact, no focal deficits, moving all 4 extremities. Psychiatric: Normal mood and affect.     Laboratory Data: See Epic and HPI I have reviewed the labs.   Pertinent Imaging:  04/21/24 10:03  Scan Result    Assessment & Plan:    High risk hematuria  - former smoker - Work-up in 11/2019 NED  - no reports of gross heme - UA negative for micro heme   rUTI's  - no recent UTI's  Nephrolithiasis  - Bilateral nephrolithiasis on CTU 11/2019  - Asymptomatic at this time   Vaginal atrophy  - asymptomatic   Return in about 1 year (around 04/21/2025) for OAB questionnaire, PVR and UA .  Kaitlin [name]   [facility] [address] [place], [place] [address] [phone]

## 2024-04-21 ENCOUNTER — Encounter: Payer: Self-pay | Admitting: Urology

## 2024-04-21 ENCOUNTER — Ambulatory Visit: Payer: Self-pay | Admitting: Urology

## 2024-04-21 VITALS — BP 128/68 | HR 70 | Wt 150.0 lb

## 2024-04-21 DIAGNOSIS — N200 Calculus of kidney: Secondary | ICD-10-CM

## 2024-04-21 DIAGNOSIS — N390 Urinary tract infection, site not specified: Secondary | ICD-10-CM

## 2024-04-21 DIAGNOSIS — R319 Hematuria, unspecified: Secondary | ICD-10-CM

## 2024-04-21 DIAGNOSIS — N952 Postmenopausal atrophic vaginitis: Secondary | ICD-10-CM

## 2024-04-21 LAB — BLADDER SCAN AMB NON-IMAGING

## 2024-04-22 ENCOUNTER — Ambulatory Visit: Payer: Self-pay | Admitting: Urology

## 2024-04-22 LAB — MICROSCOPIC EXAMINATION
Epithelial Cells (non renal): >10 /HPF — AB (ref 0–10)
WBC, UA: >30 /HPF — AB (ref 0–5)

## 2024-04-22 LAB — URINALYSIS, COMPLETE
Bilirubin, UA: NEGATIVE
Glucose, UA: NEGATIVE
Ketones, UA: NEGATIVE
Nitrite, UA: NEGATIVE
Protein,UA: NEGATIVE
RBC, UA: NEGATIVE
Specific Gravity, UA: 1.01 (ref 1.005–1.030)
Urobilinogen, Ur: 0.2 mg/dL (ref 0.2–1.0)
pH, UA: 6 (ref 5.0–7.5)

## 2024-04-30 ENCOUNTER — Ambulatory Visit: Admitting: Nurse Practitioner

## 2024-05-21 ENCOUNTER — Ambulatory Visit: Admitting: Nurse Practitioner

## 2024-05-25 ENCOUNTER — Encounter: Payer: Self-pay | Admitting: Nurse Practitioner

## 2024-05-25 ENCOUNTER — Ambulatory Visit: Admitting: Nurse Practitioner

## 2024-05-25 VITALS — BP 128/82 | HR 60 | Temp 95.0°F | Ht 65.98 in | Wt 168.0 lb

## 2024-05-25 DIAGNOSIS — I10 Essential (primary) hypertension: Secondary | ICD-10-CM

## 2024-05-25 DIAGNOSIS — N1832 Chronic kidney disease, stage 3b: Secondary | ICD-10-CM

## 2024-05-25 DIAGNOSIS — Z23 Encounter for immunization: Secondary | ICD-10-CM

## 2024-05-25 DIAGNOSIS — F0280 Dementia in other diseases classified elsewhere without behavioral disturbance: Secondary | ICD-10-CM | POA: Diagnosis not present

## 2024-05-25 DIAGNOSIS — G309 Alzheimer's disease, unspecified: Secondary | ICD-10-CM | POA: Diagnosis not present

## 2024-05-25 DIAGNOSIS — E210 Primary hyperparathyroidism: Secondary | ICD-10-CM

## 2024-05-25 DIAGNOSIS — E785 Hyperlipidemia, unspecified: Secondary | ICD-10-CM | POA: Diagnosis not present

## 2024-05-25 MED ORDER — ALLOPURINOL 100 MG PO TABS: 100.0000 mg | ORAL_TABLET | Freq: Every day | ORAL | 1 refills | Status: AC

## 2024-05-25 MED ORDER — MEMANTINE HCL 10 MG PO TABS: 10.0000 mg | ORAL_TABLET | Freq: Two times a day (BID) | ORAL | 1 refills | Status: AC

## 2024-05-25 MED ORDER — PRAVASTATIN SODIUM 40 MG PO TABS: 40.0000 mg | ORAL_TABLET | Freq: Every day | ORAL | 1 refills | Status: AC

## 2024-05-25 MED ORDER — DONEPEZIL HCL 10 MG PO TABS: 10.0000 mg | ORAL_TABLET | Freq: Every day | ORAL | 1 refills | Status: AC

## 2024-05-25 NOTE — Assessment & Plan Note (Signed)
 Chronic. Followed by Nephrology- annually. Next appt is March.  Reviewed recent note.  Labs ordered at visit today.  Follow up in 3 months.  Call sooner if concerns arise.

## 2024-05-25 NOTE — Assessment & Plan Note (Signed)
 Followed by Endo. Continue to follow up with specialist.  Reviewed recent note. Labs ordered today. Reviewed recent note.

## 2024-05-25 NOTE — Progress Notes (Signed)
 "  BP 128/82   Pulse 60   Temp (!) 95 F (35 C) (Oral)   Ht 5' 5.98 (1.676 m)   Wt 168 lb (76.2 kg)   SpO2 95%   BMI 27.13 kg/m    Subjective:    Patient ID: Kaitlin [name], female    DOB: Jul 09, 1934, 89 y.o.   MRN: [id number]  HPI: Kaitlin [name] is a 89 y.o. female  Chief Complaint  Patient presents with   office visit    3 month F/u   HYPERTENSION without Chronic Kidney Disease Not currently on medication.  Initial BP was elevated but repeat was improved.  Hypertension status: controlled  Satisfied with current treatment? yes Duration of hypertension: years BP monitoring frequency:  not checking BP range:  BP medication side effects:  no Medication compliance: excellent compliance Previous BP meds:none Aspirin : no Recurrent headaches: no Visual changes: no Palpitations: no Dyspnea: no Chest pain: no Lower extremity edema: no Dizzy/lightheaded: no  MEMORY CONCERNS Patient is doing well.  She has two close friends, Nathanel and Alisa who look out for her.  Her Son, Ozell is in [place] .  She also has a Brother in social worker, Ryan, in the area.  She is getting meals on wheels also.  She feels like her memory fluctuates.  She goes out with the senior citizen group and goes out with church friends.  Taking medications as scheduled.  Her friend Nathanel does recognize that her short term memory is not good.  She does many things by habit.  Patient is followed by Urology, Nephrology and Neurology.  Seeing the specialists annually.      Relevant past medical, surgical, family and social history reviewed and updated as indicated. Interim medical history since our last visit reviewed. Allergies and medications reviewed and updated.  Review of Systems  Eyes:  Negative for visual disturbance.  Respiratory:  Negative for cough, chest tightness and shortness of breath.   Cardiovascular:  Negative for chest pain, palpitations and leg swelling.  Neurological:  Negative for dizziness  and headaches.       Memory deficit    Per HPI unless specifically indicated above     Objective:    BP 128/82   Pulse 60   Temp (!) 95 F (35 C) (Oral)   Ht 5' 5.98 (1.676 m)   Wt 168 lb (76.2 kg)   SpO2 95%   BMI 27.13 kg/m   Wt Readings from Last 3 Encounters:  05/25/24 168 lb (76.2 kg)  04/21/24 150 lb (68 kg)  01/30/24 151 lb 12.8 oz (68.9 kg)    Physical Exam Vitals and nursing note reviewed.  Constitutional:      General: She is not in acute distress.    Appearance: Normal appearance. She is normal weight. She is not ill-appearing, toxic-appearing or diaphoretic.  HENT:     Head: Normocephalic.     Right Ear: External ear normal.     Left Ear: External ear normal.     Nose: Nose normal.     Mouth/Throat:     Mouth: Mucous membranes are moist.     Pharynx: Oropharynx is clear.  Eyes:     General:        Right eye: No discharge.        Left eye: No discharge.     Extraocular Movements: Extraocular movements intact.     Conjunctiva/sclera: Conjunctivae normal.     Pupils: Pupils are equal, round, and reactive to light.  Cardiovascular:     Rate and Rhythm: Normal rate and regular rhythm.     Heart sounds: No murmur heard. Pulmonary:     Effort: Pulmonary effort is normal. No respiratory distress.     Breath sounds: Normal breath sounds. No wheezing or rales.  Musculoskeletal:     Cervical back: Normal range of motion and neck supple.  Skin:    General: Skin is warm and dry.     Capillary Refill: Capillary refill takes less than 2 seconds.  Neurological:     General: No focal deficit present.     Mental Status: She is alert and oriented to person, place, and time. Mental status is at baseline.  Psychiatric:        Mood and Affect: Mood normal.        Behavior: Behavior normal.        Thought Content: Thought content normal.        Judgment: Judgment normal.     Results for orders placed or performed in visit on 04/21/24  Microscopic Examination    Collection Time: 04/21/24  9:54 AM   Urine  Result Value Ref Range   WBC, UA >30 (A) 0 - 5 /hpf   RBC, Urine 0-2 0 - 2 /hpf   Epithelial Cells (non renal) >10 (A) 0 - 10 /hpf   Bacteria, UA Many (A) None seen/Few  Urinalysis, Complete   Collection Time: 04/21/24  9:54 AM  Result Value Ref Range   Specific Gravity, UA 1.010 1.005 - 1.030   pH, UA 6.0 5.0 - 7.5   Color, UA Yellow Yellow   Appearance Ur Slightly cloudy Clear   Leukocytes,UA 2+ (A) Negative   Protein,UA Negative Negative/Trace   Glucose, UA Negative Negative   Ketones, UA Negative Negative   RBC, UA Negative Negative   Bilirubin, UA Negative Negative   Urobilinogen, Ur 0.2 0.2 - 1.0 mg/dL   Nitrite, UA Negative Negative   Microscopic Examination See below:   Bladder Scan (Post Void Residual) in office   Collection Time: 04/21/24 10:03 AM  Result Value Ref Range   Scan Result       Assessment & Plan:   Problem List Items Addressed This Visit       Cardiovascular and Mediastinum   Essential (primary) hypertension     Endocrine   Hyperparathyroidism, primary     Nervous and Auditory   Alzheimer disease (HCC)     Genitourinary   Stage 3b chronic kidney disease (HCC) - Primary     Other   Hyperlipidemia   Other Visit Diagnoses       Need for shingles vaccine             Follow up plan: No follow-ups on file.  A total of 30 minutes were spent on this encounter today.  When total time is documented, this includes both the face-to-face and non-face-to-face time personally spent before, during and after the visit on the date of the encounter reviewing medications, where medications are filled, specialists and follow ups.      "

## 2024-05-25 NOTE — Assessment & Plan Note (Signed)
 Chronic.  Controlled.  Not currently on any medications. Important to keep blood pressure well controlled.  Return to clinic in 3 months for reevaluation.  Labs ordered today.  Call sooner if concerns arise.

## 2024-05-25 NOTE — Assessment & Plan Note (Signed)
 Chronic.  Ongoing concern.  Has a great support symptom around her with friends who look in on her closely.  Receives meals on wheels, does memory games and tries to remain active.  Son visits from [place] .  Refills of medications sent today.  Patient is able to manage her own medications.  Long term prescriptions were sent to [facility].  Followed by Neurology-reviewed recent note. Follow up in 3 months.  Call sooner if concerns arise.   Discussed patient's weight during visit today.  She gained about 17lbs since last visit.   Continue with current regimen.

## 2024-05-25 NOTE — Assessment & Plan Note (Signed)
Chronic.  Controlled.  Continue with current medication regimen of Pravastatin daily.  Labs ordered today.  Return to clinic in 3 months for reevaluation.  Call sooner if concerns arise.

## 2024-05-26 ENCOUNTER — Ambulatory Visit: Payer: Self-pay | Admitting: Nurse Practitioner

## 2024-05-26 LAB — COMPREHENSIVE METABOLIC PANEL WITH GFR
ALT: 9 IU/L (ref 0–32)
AST: 14 IU/L (ref 0–40)
Albumin: 4.2 g/dL (ref 3.7–4.7)
Alkaline Phosphatase: 87 IU/L (ref 48–129)
BUN/Creatinine Ratio: 29 — ABNORMAL HIGH (ref 12–28)
BUN: 28 mg/dL — ABNORMAL HIGH (ref 8–27)
Bilirubin Total: 0.6 mg/dL (ref 0.0–1.2)
CO2: 26 mmol/L (ref 20–29)
Calcium: 10.5 mg/dL — ABNORMAL HIGH (ref 8.7–10.3)
Chloride: 103 mmol/L (ref 96–106)
Creatinine, Ser: 0.95 mg/dL (ref 0.57–1.00)
Globulin, Total: 2.4 g/dL (ref 1.5–4.5)
Glucose: 85 mg/dL (ref 70–99)
Potassium: 4.5 mmol/L (ref 3.5–5.2)
Sodium: 143 mmol/L (ref 134–144)
Total Protein: 6.6 g/dL (ref 6.0–8.5)
eGFR: 58 mL/min/1.73 — ABNORMAL LOW

## 2024-05-26 LAB — LIPID PANEL
Chol/HDL Ratio: 3.1 ratio (ref 0.0–4.4)
Cholesterol, Total: 198 mg/dL (ref 100–199)
HDL: 64 mg/dL
LDL Chol Calc (NIH): 121 mg/dL — ABNORMAL HIGH (ref 0–99)
Triglycerides: 69 mg/dL (ref 0–149)
VLDL Cholesterol Cal: 13 mg/dL (ref 5–40)

## 2024-08-25 ENCOUNTER — Ambulatory Visit: Admitting: Nurse Practitioner

## 2024-10-27 ENCOUNTER — Ambulatory Visit

## 2025-04-21 ENCOUNTER — Ambulatory Visit: Admitting: Urology
